# Patient Record
Sex: Male | Born: 1940 | Race: White | Hispanic: No | Smoking: Former smoker
Health system: Southern US, Community
[De-identification: ages and names within clinical notes are randomized; demographics above are authoritative.]

## PROBLEM LIST (undated history)

## (undated) DIAGNOSIS — E119 Type 2 diabetes mellitus without complications: Secondary | ICD-10-CM

## (undated) DIAGNOSIS — M1990 Unspecified osteoarthritis, unspecified site: Secondary | ICD-10-CM

## (undated) DIAGNOSIS — C3490 Malignant neoplasm of unspecified part of unspecified bronchus or lung: Secondary | ICD-10-CM

## (undated) DIAGNOSIS — J4489 Other specified chronic obstructive pulmonary disease: Secondary | ICD-10-CM

## (undated) DIAGNOSIS — E785 Hyperlipidemia, unspecified: Secondary | ICD-10-CM

## (undated) DIAGNOSIS — IMO0001 Reserved for inherently not codable concepts without codable children: Secondary | ICD-10-CM

## (undated) DIAGNOSIS — R0602 Shortness of breath: Secondary | ICD-10-CM

## (undated) DIAGNOSIS — J449 Chronic obstructive pulmonary disease, unspecified: Secondary | ICD-10-CM

## (undated) DIAGNOSIS — I480 Paroxysmal atrial fibrillation: Secondary | ICD-10-CM

## (undated) DIAGNOSIS — I10 Essential (primary) hypertension: Secondary | ICD-10-CM

## (undated) DIAGNOSIS — IMO0002 Reserved for concepts with insufficient information to code with codable children: Secondary | ICD-10-CM

## (undated) HISTORY — DX: Paroxysmal atrial fibrillation: I48.0

## (undated) HISTORY — PX: EYE SURGERY: SHX253

## (undated) HISTORY — DX: Reserved for concepts with insufficient information to code with codable children: IMO0002

## (undated) HISTORY — DX: Type 2 diabetes mellitus without complications: E11.9

## (undated) HISTORY — DX: Malignant neoplasm of unspecified part of unspecified bronchus or lung: C34.90

## (undated) HISTORY — DX: Chronic obstructive pulmonary disease, unspecified: J44.9

## (undated) HISTORY — DX: Reserved for inherently not codable concepts without codable children: IMO0001

## (undated) HISTORY — DX: Other specified chronic obstructive pulmonary disease: J44.89

## (undated) HISTORY — DX: Hyperlipidemia, unspecified: E78.5

---

## 1993-12-13 HISTORY — PX: CHOLECYSTECTOMY: SHX55

## 1993-12-13 HISTORY — PX: NEPHRECTOMY: SHX65

## 1998-12-19 ENCOUNTER — Ambulatory Visit (HOSPITAL_COMMUNITY): Admission: RE | Admit: 1998-12-19 | Discharge: 1998-12-19 | Payer: Self-pay | Admitting: Gastroenterology

## 2003-03-27 ENCOUNTER — Encounter: Admission: RE | Admit: 2003-03-27 | Discharge: 2003-03-27 | Payer: Self-pay | Admitting: Family Medicine

## 2003-03-27 ENCOUNTER — Encounter: Payer: Self-pay | Admitting: Family Medicine

## 2004-01-20 ENCOUNTER — Ambulatory Visit (HOSPITAL_COMMUNITY): Admission: RE | Admit: 2004-01-20 | Discharge: 2004-01-20 | Payer: Self-pay | Admitting: Gastroenterology

## 2004-01-20 ENCOUNTER — Encounter (INDEPENDENT_AMBULATORY_CARE_PROVIDER_SITE_OTHER): Payer: Self-pay | Admitting: *Deleted

## 2005-01-04 ENCOUNTER — Encounter: Admission: RE | Admit: 2005-01-04 | Discharge: 2005-04-04 | Payer: Self-pay | Admitting: Family Medicine

## 2007-07-10 ENCOUNTER — Encounter: Admission: RE | Admit: 2007-07-10 | Discharge: 2007-07-10 | Payer: Self-pay | Admitting: Family Medicine

## 2009-04-09 ENCOUNTER — Encounter: Admission: RE | Admit: 2009-04-09 | Discharge: 2009-04-09 | Payer: Self-pay | Admitting: Family Medicine

## 2010-07-08 ENCOUNTER — Encounter: Payer: Self-pay | Admitting: Internal Medicine

## 2010-07-08 ENCOUNTER — Encounter: Admission: RE | Admit: 2010-07-08 | Discharge: 2010-07-08 | Payer: Self-pay | Admitting: Family Medicine

## 2010-07-16 ENCOUNTER — Ambulatory Visit: Payer: Self-pay | Admitting: Internal Medicine

## 2010-07-16 DIAGNOSIS — J449 Chronic obstructive pulmonary disease, unspecified: Secondary | ICD-10-CM | POA: Insufficient documentation

## 2010-07-16 DIAGNOSIS — E119 Type 2 diabetes mellitus without complications: Secondary | ICD-10-CM

## 2010-07-16 DIAGNOSIS — E785 Hyperlipidemia, unspecified: Secondary | ICD-10-CM | POA: Insufficient documentation

## 2010-08-13 ENCOUNTER — Ambulatory Visit: Payer: Self-pay | Admitting: Internal Medicine

## 2010-11-12 ENCOUNTER — Ambulatory Visit: Payer: Self-pay | Admitting: Internal Medicine

## 2011-01-14 NOTE — Assessment & Plan Note (Signed)
Summary: Pulmonary/ f/u ov  pfts showing 23% reversal,  try off spiriva   Copy to:  Dr. Manus Gunning Primary Provider/Referring Provider:  Dr. Blair Heys  CC:  4 wk followup.  Pt states that he feels he is able to do more activity without getting out of breath.  No complaints today. Has only used ventolin x 2 sionce last seen.Marland Kitchen  History of Present Illness: 69 yowm quit smoking  2004 due to sob got some better after that but required spiriva and albuterol with gradually increase need for the latter over the years with only one block tol without stopping.  July 16, 2010  1st pulmonary office eval  after recent flare assoc with nasal congestion also  rx with prednisone started on dulera by primary care  and now back near nl.  minimal mucoid sputum in am, no noct exac but still using lots of ventolin and concerned he's using too much.  Continue Dulera but increase the strength to the 200 and use it 2 puffs first thing  in am and 2 puffs again in pm about 12 hours later  August 13, 2010 4 wk followup.  Pt states that he feels he is able to do more activity without getting out of breath.  No complaints today. Has only used ventolin x 2 sionce last seen.  no nocturnal symptoms. wants to try off spiriva.  sob in pm's when walks in heat, o/w ok. Pt denies any significant sore throat, dysphagia, itching, sneezing,  nasal congestion or excess secretions,  fever, chills, sweats, unintended wt loss, pleuritic or exertional cp, hempoptysis, change in activity tolerance  orthopnea pnd or leg swelling Pt also denies any obvious fluctuation in symptoms with weather or environmental change or other alleviating or aggravating factors.     Pt denies any increase in rescue therapy over baseline, denies waking up needing it or having early am exacerbations of coughing/wheezing/ or dyspnea   Problems Prior to Update: 1)  Hyperlipidemia  (ICD-272.4) 2)  Dm  (ICD-250.00) 3)  COPD  (ICD-496)  Medications Prior to  Update: 1)  Spiriva Handihaler 18 Mcg Caps (Tiotropium Bromide Monohydrate) .... Inhale Contents of 1 Capsule Daily 2)  Dulera 200-5 Mcg/act Aero (Mometasone Furo-Formoterol Fum) .... 2 Puffs First Thing  in Am and 2 Puffs Again in Pm About 12 Hours Later 3)  Metformin Hcl 500 Mg Tabs (Metformin Hcl) .Marland Kitchen.. 1 Four Times A Day 4)  Doxazosin Mesylate 2 Mg Tabs (Doxazosin Mesylate) .Marland Kitchen.. 1 Once Daily 5)  Simvastatin 40 Mg Tabs (Simvastatin) .Marland Kitchen.. 1 At Bedtime 6)  Ventolin Hfa 108 (90 Base) Mcg/act Aers (Albuterol Sulfate) .... 2 Puffs Every 4-6 Hours As Needed  Current Medications (verified): 1)  Spiriva Handihaler 18 Mcg Caps (Tiotropium Bromide Monohydrate) .... Inhale Contents of 1 Capsule Daily 2)  Dulera 200-5 Mcg/act Aero (Mometasone Furo-Formoterol Fum) .... 2 Puffs First Thing  in Am and 2 Puffs Again in Pm About 12 Hours Later 3)  Metformin Hcl 500 Mg Tabs (Metformin Hcl) .Marland Kitchen.. 1 Four Times A Day 4)  Doxazosin Mesylate 2 Mg Tabs (Doxazosin Mesylate) .Marland Kitchen.. 1 Once Daily 5)  Simvastatin 40 Mg Tabs (Simvastatin) .Marland Kitchen.. 1 At Bedtime 6)  Ventolin Hfa 108 (90 Base) Mcg/act Aers (Albuterol Sulfate) .... 2 Puffs Every 4-6 Hours As Needed  Allergies (verified): No Known Drug Allergies  Past History:  Past Medical History:  HYPERLIPIDEMIA (ICD-272.4) DM (ICD-250.00) COPD (ICD-496)    - HFA 25-50% p coaching July 16, 2010 >>  75% August 13, 2010     - PFT's August 13, 2010 FEV1  .93 (34%) ratio 37  with 23 % better p B2  DLC0  69%   Vital Signs:  Patient profile:   70 year old male Weight:      189 pounds O2 Sat:      96 % on Room air Temp:     97.4 degrees F oral Pulse rate:   81 / minute BP sitting:   116 / 78  (left arm)  Vitals Entered By: Vernie Murders (August 13, 2010 11:07 AM)  O2 Flow:  Room air  Physical Exam  Additional Exam:  amb pleasant elderly wm nad wt 189 July 16, 2010 > 189 August 13, 2010  HEENT mild turbinate edema.  Oropharynx no thrush or excess pnd  or cobblestoning.  No JVD or cervical adenopathy. Mild accessory muscle hypertrophy. Trachea midline, nl thryroid. Chest was hyperinflated by percussion with diminished breath sounds and moderate increased exp time without wheeze. Hoover sign positive at mid inspiration. Regular rate and rhythm without murmur gallop or rub or increase P2 or edema.  Abd: no hsm, nl excursion. Ext warm without cyanosis or clubbing.     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  This improved from  50-75%  I think of spiriva in this setting like purchasing high octane fuel for an older car with lots of miles on it. It may help the perfomance enough to warrant the purchase, but it won't change the longevity of the car or make it any easier parking it. It should improve peak performance if the patient is patient and lets the medicine work the way it's intended  - improving activity tolerance where limits on the mechanical ventilatory ceiling causing dynamic hyperinflation is the problem.   See instructions for specific recommendations   Other Orders: Est. Patient Level IV (65784)  Patient Instructions: 1)   Try off spiriva remember it's like buying high octane fuel 2)  stay on dulera 200 2 puffs first thing  in am and 2 puffs again in pm about 12 hours later 3)  Return to office in 3 months, sooner if needed  Prescriptions: VENTOLIN HFA 108 (90 BASE) MCG/ACT AERS (ALBUTEROL SULFATE) 2 puffs every 4-6 hours as needed  #1 x 1   Entered and Authorized by:   Nyoka Cowden MD   Signed by:   Nyoka Cowden MD on 08/13/2010   Method used:   Electronically to        [facility]* (retail)       [address] [place], [place]  [address]       Ph: [phone] or [phone]       Fax: [phone]   RxID:   [id number] DULERA 200-5 MCG/ACT AERO (MOMETASONE FURO-FORMOTEROL FUM) 2 puffs first thing  in am and 2 puffs again in pm about 12 hours later  #1 x 11   Entered and Authorized by:   Nyoka Cowden MD   Signed  by:   Nyoka Cowden MD on 08/13/2010   Method used:   Electronically to        [facility]* (retail)       [address] [place], [place]  [address]       Ph: [phone] or [phone]       Fax: [phone]   RxID:   [id number]

## 2011-01-14 NOTE — Assessment & Plan Note (Signed)
Summary: Pulmonary new pt eval/ HFA 50%   Visit Type:  Initial Consult Copy to:  Dr. Manus Gunning Primary Lorinda Copland/Referring Cote Mayabb:  Dr. Blair Heys  CC:  Dyspnea.  History of Present Illness: 52 yowm quit smoking  2004 due to sob got some better after that but required spiriva and albuterol with gradually increase need for the latter over the years with only one block tol without stopping.  July 16, 2010  1st pulmonary office eval  after recent flare assoc with nasal congestion also  rx with prednisone started on dulera by primary care  and now back near nl.  minimal mucoid sputum in am, no noct exac but still using lots of ventolin and concerned he's using too much.  Pt denies any significant sore throat, dysphagia, itching, sneezing,  ongoing nasal congestion or excess secretions,  fever, chills, sweats, unintended wt loss, pleuritic or exertional cp, hempoptysis, change in activity tolerance  orthopnea pnd or leg swelling.  Pt also denies any obvious fluctuation in symptoms with weather or environmental change or other alleviating or aggravating factors.       Current Medications (verified): 1)  Spiriva Handihaler 18 Mcg Caps (Tiotropium Bromide Monohydrate) .... Inhale Contents of 1 Capsule Daily 2)  Ventolin Hfa 108 (90 Base) Mcg/act Aers (Albuterol Sulfate) .... 2 Puffs Every 4-6 Hours As Needed 3)  Dulera 100-5 Mcg/act Aero (Mometasone Furo-Formoterol Fum) .... 2 Puffs Two Times A Day 4)  Metformin Hcl 500 Mg Tabs (Metformin Hcl) .Marland Kitchen.. 1 Four Times A Day 5)  Doxazosin Mesylate 2 Mg Tabs (Doxazosin Mesylate) .Marland Kitchen.. 1 Once Daily 6)  Simvastatin 40 Mg Tabs (Simvastatin) .Marland Kitchen.. 1 At Bedtime  Allergies (verified): No Known Drug Allergies  Past History:  Past Medical History:  HYPERLIPIDEMIA (ICD-272.4) DM (ICD-250.00) COPD (ICD-496)    - HFA 25-50% p coaching July 16, 2010     - PFT's rec July 16, 2010   Family History: Negative for respiratory diseases or atopy   Social  History: Married Children Retired Former smoker.  Quit in 2004.  Smoked approx 40 yrs up to 1-1/2 ppd. No ETOH  Review of Systems       The patient complains of shortness of breath with activity and nasal congestion/difficulty breathing through nose.  The patient denies shortness of breath at rest, productive cough, non-productive cough, coughing up blood, chest pain, irregular heartbeats, acid heartburn, indigestion, loss of appetite, weight change, abdominal pain, difficulty swallowing, sore throat, tooth/dental problems, headaches, sneezing, itching, ear ache, anxiety, depression, hand/feet swelling, joint stiffness or pain, rash, change in color of mucus, and fever.    Vital Signs:  Patient profile:   70 year old male Height:      68 inches Weight:      189 pounds BMI:     28.84 O2 Sat:      97 % on Room air Temp:     97.3 degrees F oral Pulse rate:   97 / minute BP sitting:   118 / 80  (left arm)  Vitals Entered By: Vernie Murders (July 16, 2010 11:30 AM)  O2 Flow:  Room air  Physical Exam  Additional Exam:  amb pleasant elderly wm nad wt 189 July 16, 2010  HEENT mild turbinate edema.  Oropharynx no thrush or excess pnd or cobblestoning.  No JVD or cervical adenopathy. Mild accessory muscle hypertrophy. Trachea midline, nl thryroid. Chest was hyperinflated by percussion with diminished breath sounds and moderate increased exp time without  wheeze. Hoover sign positive at mid inspiration. Regular rate and rhythm without murmur gallop or rub or increase P2 or edema.  Abd: no hsm, nl excursion. Ext warm without cyanosis or clubbing.     CXR  Procedure date:  07/08/2010  Findings:      copd/emphsysema, no acute infiltrates  Impression & Recommendations:  Problem # 1:  COPD (ICD-496) Mild to mod clincally with sign reversible component suggested by dependence on ventolin and recent steroid responsive flare    DDX of  difficult airways managment all start with A with  one B and one C:  Adherence, Ace Inhibitors, Acid Reflux, Active Sinus Disease, Alpha 1 Antitripsin deficiency, Anxiety masquerading as Airways dz,  ABPA,  allergy(esp in young), Aspiration (esp in elderly), Adverse effects of DPI,  Active smokers, plus one B  = Beta blocker use and occlult CHF  Adherence is probably the greatest concern here and starts with understanding/ ability to use hfa optimally I spent extra time with the patient today explaining optimal mdi  technique.  This improved from  25-50%.  He feels dulera is already helping so try max rx x 4 weeks then return for PFT's and may even consider stopping spriva, depending on his ability to use hfa and his response to dulera 200  See instructions for specific recommendations   Medications Added to Medication List This Visit: 1)  Spiriva Handihaler 18 Mcg Caps (Tiotropium bromide monohydrate) .... Inhale contents of 1 capsule daily 2)  Dulera 100-5 Mcg/act Aero (Mometasone furo-formoterol fum) .... 2 puffs two times a day 3)  Dulera 200-5 Mcg/act Aero (Mometasone furo-formoterol fum) .... 2 puffs first thing  in am and 2 puffs again in pm about 12 hours later 4)  Metformin Hcl 500 Mg Tabs (Metformin hcl) .Marland Kitchen.. 1 four times a day 5)  Doxazosin Mesylate 2 Mg Tabs (Doxazosin mesylate) .Marland Kitchen.. 1 once daily 6)  Simvastatin 40 Mg Tabs (Simvastatin) .Marland Kitchen.. 1 at bedtime 7)  Ventolin Hfa 108 (90 Base) Mcg/act Aers (Albuterol sulfate) .... 2 puffs every 4-6 hours as needed  Other Orders: New Patient Level V ([address])  Patient Instructions: 1)  Continue Dulera but increase the strength to the 200 and use it 2 puffs first thing  in am and 2 puffs again in pm about 12 hours later  2)  Please schedule a follow-up appointment in 4 weeks, sooner if needed with PFT's on return

## 2011-01-14 NOTE — Assessment & Plan Note (Signed)
Summary: Pulmonary/  ext summary f/u ov with hfa 90%   Copy to:  Dr. Manus Gunning Primary Velna Hedgecock/Referring Orla Jolliff:  Dr. Blair Heys  CC:  Dyspnea- better.  History of Present Illness: 49 yowm quit smoking  2004 due to sob got some better after that but required spiriva and albuterol with gradually increase need for the latter over the years with only one block tol without stopping.  July 16, 2010  1st pulmonary office eval  after recent flare assoc with nasal congestion also  rx with prednisone started on dulera by primary care  and now back near nl.  minimal mucoid sputum in am, no noct exac but still using lots of ventolin and concerned he's using too much.  PFT's with 23% better p B2 rec  Continue Dulera but increase the strength to the 200 and use it 2 puffs first thing  in am and 2 puffs again in pm about 12 hours later  August 13, 2010 4 wk followup.  Pt states that he feels he is able to do more activity without getting out of breath.  No complaints today. Has only used ventolin x 2 sionce last seen.  no nocturnal symptoms. wants to try off spiriva.  sob in pm's when walks in heat, o/w ok. rec  Try off spiriva remember it's like buying high octane fuel stay on dulera 200 2 puffs first thing  in am and 2 puffs again in pm about 12 hours later Found dulera too expensive so d/c'd around Oct 1, continued spiriva and no change in doe but uses ventolin again where didn't need it on dulera.  November 12, 2010 ov breathing ok but back using ventolin daily, esp in pm, p stopped dulera samples due to price of rx.  Pt denies any significant sore throat, dysphagia, itching, sneezing,  nasal congestion or excess secretions,  fever, chills, sweats, unintended wt loss, pleuritic or exertional cp, hempoptysis, variability  in activity tolerance  orthopnea pnd or leg swelling. Pt also denies any obvious fluctuation in symptoms with weather or environmental change or other alleviating or aggravating  factors.    Pt denies   denies waking up needing it or having early am exacerbations of coughing/wheezing/ or dyspnea       Current Medications (verified): 1)  Spiriva Handihaler 18 Mcg Caps (Tiotropium Bromide Monohydrate) .... Inhale Contents of 1 Capsule Daily 2)  Metformin Hcl 500 Mg Tabs (Metformin Hcl) .Marland Kitchen.. 1 Four Times A Day 3)  Doxazosin Mesylate 2 Mg Tabs (Doxazosin Mesylate) .Marland Kitchen.. 1 Once Daily 4)  Simvastatin 40 Mg Tabs (Simvastatin) .Marland Kitchen.. 1 At Bedtime 5)  Ventolin Hfa 108 (90 Base) Mcg/act Aers (Albuterol Sulfate) .... 2 Puffs Every 4-6 Hours As Needed  Allergies (verified): No Known Drug Allergies  Past History:  Past Medical History:  HYPERLIPIDEMIA (ICD-272.4) DM (ICD-250.00) COPD (ICD-496)    - HFA 25-50%  July 16, 2010> 75% August 13, 2010 > 90% November 12, 2010     - PFT's August 13, 2010 FEV1  .93 (34%) ratio 37  with 23 % better p B2  DLC0  69%   Vital Signs:  Patient profile:   70 year old male Weight:      196 pounds O2 Sat:      99 % on Room air Temp:     97.6 degrees F oral Pulse rate:   71 / minute BP sitting:   158 / 96  (left arm)  Vitals Entered By: Vernie Murders (November 12, 2010 9:30 AM)  O2 Flow:  Room air  Physical Exam  Additional Exam:  amb pleasant elderly wm nad pseudowheeze resolves with purse lip maneuver  wt 189 July 16, 2010 > 189 August 13, 2010 > 196 November 12, 2010  HEENT mild turbinate edema.  Oropharynx no thrush or excess pnd or cobblestoning.  No JVD or cervical adenopathy. Mild accessory muscle hypertrophy. Trachea midline, nl thryroid. Chest was hyperinflated by percussion with diminished breath sounds and moderate increased exp time without wheeze. Hoover sign positive at mid inspiration. Regular rate and rhythm without murmur gallop or rub or increase P2 or edema.  Abd: no hsm, nl excursion. Ext warm without cyanosis or clubbing.     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   I spent extra time with the patient today explaining optimal mdi  technique.  This improved from  75-90%   I think of spiriva in this setting like purchasing high octane fuel for an older car with lots of miles on it. It may help the perfomance enough to warrant the purchase, but it won't change the longevity of the car or make it any easier parking it. It should improve peak performance if the patient is patient and lets the medicine work the way it's intended  - improving activity tolerance where limits on the mechanical ventilatory ceiling causing dynamic hyperinflation is the problem.   See instructions for specific recommendations   Medications Added to Medication List This Visit: 1)  Symbicort 160-4.5 Mcg/act Aero (Budesonide-formoterol fumarate) .... 2 puffs first thing  in am and 2 puffs again in pm about 12 hours later  Other Orders: Est. Patient Level IV ([address])  Patient Instructions: 1)  start symbicort 160 2 puffs first thing  in am and 2 puffs again in pm about 12 hours later you should notice better better breathing and less need for ventolin 2)  Work on inhaler technique:  relax and blow all the way out then take a nice smooth deep breath back in, triggering the inhaler at same time you start breathing in  3)  Return to office in 3 months, sooner if needed  Prescriptions: SYMBICORT 160-4.5 MCG/ACT  AERO (BUDESONIDE-FORMOTEROL FUMARATE) 2 puffs first thing  in am and 2 puffs again in pm about 12 hours later  #1 x 11   Entered and Authorized by:   Nyoka Cowden MD   Signed by:   Nyoka Cowden MD on 11/12/2010   Method used:   Print then Give to  Patient   RxID:   [id number]    Immunization History:  Influenza Immunization History:    Influenza:  historical (08/13/2010)

## 2011-01-14 NOTE — Miscellaneous (Signed)
Summary: Orders Update pft charges  Clinical Lists Changes  Orders: Added new Service order of Carbon Monoxide diffusing w/capacity (94720) - Signed Added new Service order of Lung Volumes (94240) - Signed Added new Service order of Spirometry (Pre & Post) (94060) - Signed 

## 2011-01-28 ENCOUNTER — Ambulatory Visit (INDEPENDENT_AMBULATORY_CARE_PROVIDER_SITE_OTHER): Payer: MEDICARE | Admitting: Internal Medicine

## 2011-01-28 ENCOUNTER — Encounter: Payer: Self-pay | Admitting: Internal Medicine

## 2011-01-28 DIAGNOSIS — J449 Chronic obstructive pulmonary disease, unspecified: Secondary | ICD-10-CM

## 2011-02-03 NOTE — Assessment & Plan Note (Signed)
Summary: Pulmonary/ final summary f/u ov with hfa 100% > f/u prn   Visit Type:  Follow-up Copy to:  Dr. Manus Gunning Primary Provider/Referring Provider:  Dr. Blair Heys  CC:  Pt states breathing is the same.Brent Wade  History of Present Illness: 70 yowm quit smoking  2004 due to sob got some better after that but required spiriva and albuterol with gradually increase need for the latter over the years with only one block tol without stopping.  July 16, 2010  1st pulmonary office eval  after recent flare assoc with nasal congestion also  rx with prednisone started on dulera by primary care  and now back near nl.  minimal mucoid sputum in am, no noct exac but still using lots of ventolin and concerned he's using too much.  PFT's with 23% better p B2 rec  Continue Dulera but increase the strength to the 200 and use it 2 puffs first thing  in am and 2 puffs again in pm about 12 hours later  August 13, 2010 4 wk followup.  Pt states that he feels he is able to do more activity without getting out of breath.  No complaints today. Has only used ventolin x 2 sionce last seen.  no nocturnal symptoms. wants to try off spiriva.  sob in pm's when walks in heat, o/w ok. rec  Try off spiriva remember it's like buying high octane fuel stay on dulera 200 2 puffs first thing  in am and 2 puffs again in pm about 12 hours later Found dulera too expensive so d/c'd around Oct 1, continued spiriva and no change in doe but uses ventolin again where didn't need it on dulera.  November 12, 2010 ov breathing ok but back using ventolin daily, esp in pm, p stopped dulera samples due to price of rx.  start symbicort 160 2 puffs first thing  in am and 2 puffs again in pm about 12 hours later you should notice better better breathing and less need for ventolin  January 28, 2011 ov overall better sob to point where can walk up to a mile if he takes his time but trouble with gardening (tilling) and rarely using any saba.  sleeping ok with no noct or early am exac.  Pt denies any significant sore throat, dysphagia, itching, sneezing,  nasal congestion or excess secretions,  fever, chills, sweats, unintended wt loss, pleuritic or exertional cp, hempoptysis, change in activity tolerance  orthopnea pnd or leg swelling      Preventive Screening-Counseling & Management  Alcohol-Tobacco     Alcohol drinks/day: 0     Smoking Status: quit     Packs/Day: 2.0     Year Started: 1959     Year Quit: 2005  Current Medications (verified): 1)  Spiriva Handihaler 18 Mcg Caps (Tiotropium Bromide Monohydrate) .... Inhale Contents of 1 Capsule Daily 2)  Metformin Hcl 500 Mg Tabs (Metformin Hcl) .Brent Wade.. 1 Four Times A Day 3)  Doxazosin Mesylate 2 Mg Tabs (Doxazosin Mesylate) .Brent Wade.. 1 Once Daily 4)  Simvastatin 40 Mg Tabs (Simvastatin) .Brent Wade.. 1 At Bedtime 5)  Symbicort 160-4.5 Mcg/act  Aero (Budesonide-Formoterol Fumarate) .... 2 Puffs First Thing  in Am and 2 Puffs Again in Pm About 12 Hours Later 6)  Ventolin Hfa 108 (90 Base) Mcg/act Aers (Albuterol Sulfate) .... 2 Puffs Every 4-6 Hours As Needed  Allergies (verified): No Known Drug Allergies  Past History:  Past Medical History:  HYPERLIPIDEMIA (ICD-272.4) DM (ICD-250.00) COPD (ICD-496)    -  HFA 25-50%  July 16, 2010>   100% January 28, 2011     - PFT's August 13, 2010 FEV1  .93 (34%) ratio 37  with 23 % better p B2  DLC0  69%   Social History: Smoking Status:  quit Packs/Day:  2.0 Alcohol drinks/day:  0  Vital Signs:  Patient profile:   70 year old male Height:      68 inches Weight:      19.2 pounds BMI:     2.93 O2 Sat:      96 % on Room air Temp:     97.7 degrees F oral Pulse rate:   73 / minute BP sitting:   138 / 88  (left arm) Cuff size:   regular  Vitals Entered By: Zackery Barefoot CMA (January 28, 2011 10:46 AM)  O2 Flow:  Room air CC: Pt states breathing is the same. Comments Medications reviewed with patient Verified contact number and  pharmacy with patient Zackery Barefoot [facility]  January 28, 2011 10:46 AM    Physical Exam  Additional Exam:  amb pleasant elderly wm nad   wt 189 July 16, 2010 >  196 November 12, 2010 > 192 January 28, 2011  HEENT mild turbinate edema.  Oropharynx edentuous with dentures in place but  no thrush or excess pnd or cobblestoning.  No JVD or cervical adenopathy. Mild accessory muscle hypertrophy. Trachea midline, nl thryroid. Chest was hyperinflated by percussion with diminished breath sounds and moderate increased exp time without wheeze. Hoover sign positive at mid inspiration. Regular rate and rhythm without murmur gallop or rub or increase P2 or edema.  Abd: no hsm, nl excursion. Ext warm without cyanosis or clubbing.     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   I had an extended discussion with the patient today lasting 15 to 20 minutes of a 25 minute visit on the following issues:   I spent extra time with the patient today explaining optimal mdi  technique.  This improved from  90-100%   Ok to use ventolin as needed before planned strenous activity and try to "be all he can be" in term of activities desired, with formal pulmonary rehab an option in the future  Refills per Dr Randel Books office or f/u here yearly at this point.   Other Orders: Est. Patient Level IV (04540)  Patient Instructions: 1)  If there is a task that you know will make you short of breath like Tilling go ahead and use your ventolin 2 puffs before the activity  2)  If loosing ground with you exercise with your breathing or not happy with breathing medications return to this clinic

## 2011-04-30 NOTE — Op Note (Signed)
NAME:  Brent Wade, Brent Wade                        ACCOUNT NO.:  [id number]   MEDICAL RECORD NO.:  [id number]                   PATIENT TYPE:  AMB   LOCATION:  ENDO                                 FACILITY:  [facility]   PHYSICIAN:  Petra Kuba, M.D.                 DATE OF BIRTH:  January 05, 1941   DATE OF PROCEDURE:  01/20/2004  DATE OF DISCHARGE:                                 OPERATIVE REPORT   PROCEDURE:  Colonoscopy with hot biopsy.   INDICATION:  Patient with history of colon polyps due for re-screening.  Consent was signed after risks, benefits, methods, and options were  thoroughly discussed in the office on multiple occasions.   MEDICATIONS USED:  Demerol 60, Versed 6.   PROCEDURE:  Rectal inspection is pertinent for external hemorrhoids, small.  Digital exam was negative.  The video-colonoscope was inserted and easily  advanced around the colon to the cecum.  He did not require any abdominal  pressure or position changes.  The cecum was identified by the appendiceal  orifice and the ileocecal valve.  No obvious abnormalities were seen on  insertion of the scope.  It was slowly withdrawn.  The prep was adequate.  There was some liquid stool that required washing and suctioning.  On  withdrawal from the colon no abnormalities were seen until we withdrew back  to the mid sigmoid where a small polyp was seen and was hot biopsied x1.  The scope was further withdrawn and no addition findings were seen as we  withdrew back to the rectum.  Anorectal pull through on retroflexion  confirmed some small hemorrhoids.  The scope was reinserted a short ways up  the left side of the colon.  The air was suctioned.  The scope was removed.   The patient tolerated the procedure well.  There was no obvious immediate  complication.   ENDOSCOPIC DIAGNOSIS:  1. Internal and external hemorrhoids.  2. Small mid sigmoid polyp, hot biopsied.  3. Otherwise within normal limits to the cecum.   PLAN:  1.  Yearly rectals and guaiac per Dr. Manus Gunning.  2. Await pathology.  Probable recheck colon screening in five years and have     him seen back sooner p.r.n.                                               Petra Kuba, M.D.    MEM/MEDQ  D:  01/20/2004  T:  01/20/2004  Job:  [id number]   cc:   Bryan Lemma. Manus Gunning, M.D.  [address] [place]  [place] [address]  Fax: [phone]

## 2011-06-23 ENCOUNTER — Telehealth: Payer: Self-pay | Admitting: *Deleted

## 2011-06-23 NOTE — Telephone Encounter (Signed)
Ok x one only but needs ov before runs out with all meds/ nebs/inhalers in hand to regroup because this is too frequent refill on what should be a rescue inhaler

## 2011-06-23 NOTE — Telephone Encounter (Signed)
Dr. Sherene Sires, we received refill request from [facility] for proair 2 puffs every 4-6 hours as needed.  Rx was last sent on 01/11/11 # 1 x 5.  Pharmacy states pt has already pick up all of these fills -- are you ok with sending in rx?

## 2011-06-24 MED ORDER — ALBUTEROL SULFATE HFA 108 (90 BASE) MCG/ACT IN AERS: 2.0000 | INHALATION_SPRAY | Freq: Four times a day (QID) | RESPIRATORY_TRACT | Status: DC | PRN

## 2011-06-24 NOTE — Telephone Encounter (Signed)
Spoke with pt and sched him for appt with MW for 7.25.12 at 10 am. I advised he needs to bring all meds in hnad including all inhalers he uses. Pt verbalized understanding and rx for proair sent to pharm ok x 1 only.

## 2011-07-06 ENCOUNTER — Encounter: Payer: Self-pay | Admitting: Internal Medicine

## 2011-07-06 ENCOUNTER — Ambulatory Visit (INDEPENDENT_AMBULATORY_CARE_PROVIDER_SITE_OTHER): Payer: Medicare Other | Admitting: Internal Medicine

## 2011-07-06 ENCOUNTER — Ambulatory Visit (INDEPENDENT_AMBULATORY_CARE_PROVIDER_SITE_OTHER)
Admission: RE | Admit: 2011-07-06 | Discharge: 2011-07-06 | Disposition: A | Discharge status: Home / Self Care | Payer: Medicare Other | Source: Ambulatory Visit | Attending: Internal Medicine | Admitting: Internal Medicine

## 2011-07-06 VITALS — BP 116/80 | HR 86 | Temp 97.6°F | Ht 68.0 in | Wt 193.6 lb

## 2011-07-06 DIAGNOSIS — J4489 Other specified chronic obstructive pulmonary disease: Secondary | ICD-10-CM

## 2011-07-06 DIAGNOSIS — J449 Chronic obstructive pulmonary disease, unspecified: Secondary | ICD-10-CM

## 2011-07-06 DIAGNOSIS — J984 Other disorders of lung: Secondary | ICD-10-CM

## 2011-07-06 DIAGNOSIS — C3490 Malignant neoplasm of unspecified part of unspecified bronchus or lung: Secondary | ICD-10-CM | POA: Insufficient documentation

## 2011-07-06 DIAGNOSIS — R911 Solitary pulmonary nodule: Secondary | ICD-10-CM

## 2011-07-06 HISTORY — DX: Malignant neoplasm of unspecified part of unspecified bronchus or lung: C34.90

## 2011-07-06 NOTE — Progress Notes (Signed)
Subjective:     Patient ID: Brent Wade, male   DOB: June 12, 1941, 70 y.o.   MRN: [id number]  HPI  37 yowm quit smoking 2004 due to sob got some better after that but required spiriva and albuterol with gradually increase need for the latter over the years with only one block tol without stopping.   July 16, 2010 1st pulmonary office eval after recent flare assoc with nasal congestion also rx with prednisone started on dulera by primary care and now back near nl. minimal mucoid sputum in am, no noct exac but still using lots of ventolin and concerned he's using too much.  PFT's with 23% better p B2  rec Continue Dulera but increase the strength to the 200 and use it 2 puffs first thing in am and 2 puffs again in pm about 12 hours later   August 13, 2010 4 wk followup. Pt states that he feels he is able to do more activity without getting out of breath. No complaints today. Has only used ventolin x 2 sionce last seen. no nocturnal symptoms. wants to try off spiriva. sob in pm's when walks in heat, o/w ok. rec Try off spiriva remember it's like buying high octane fuel  stay on dulera 200 2 puffs first thing in am and 2 puffs again in pm about 12 hours later  Found dulera too expensive so d/c'd around Oct 1, continued spiriva and no change in doe but uses ventolin again where didn't need it on dulera.   November 12, 2010 ov breathing ok but back using ventolin daily, esp in pm, p stopped dulera samples due to price of rx. rec  start symbicort 160 2 puffs first thing in am and 2 puffs again in pm about 12 hours later you should notice better better breathing and less need for ventolin   January 28, 2011 ov overall better sob to point where can walk up to a mile if he takes his time but trouble with gardening (tilling) and rarely using any saba. sleeping ok with no noct or early am exac. rec no change in rx.  07/06/2011 ov/ Brent Wade cc worse breathing when hot, otherwise doing fine with no cough -  using rescue up to 3 x daily but Sleeping ok without nocturnal  or early am exac of resp c/o's or need for noct saba.   Pt denies any significant sore throat, dysphagia, itching, sneezing,  nasal congestion or excess/ purulent secretions,  fever, chills, sweats, unintended wt loss, pleuritic or exertional cp, hempoptysis, orthopnea pnd or leg swelling.    Also denies any obvious fluctuation of symptoms with weather or environmental changes or other aggravating or alleviating factors.     Past Medical History:  HYPERLIPIDEMIA (ICD-272.4)  DM (ICD-250.00)  COPD (ICD-496)  - HFA 90% 07/06/2011  - PFT's August 13, 2010 FEV1 .93 (34%) ratio 37 with 23 % better p B2 DLC0 69%  ? RUL Nodule 07/06/2011 > placed in tickle file for recall @ 3 months  Social History:  Smoking Status: quit  Packs/Day: 2.0  Alcohol drinks/day: 0  :    Review of Systems     Objective:   Physical Exam    amb pleasant elderly wm nad  wt 189 July 16, 2010 > 196 November 12, 2010 > 192 January 28, 2011 > 193 07/06/2011  HEENT mild turbinate edema. Oropharynx edentuous with dentures in place but no thrush or excess pnd or cobblestoning. No JVD or cervical adenopathy. Mild  accessory muscle hypertrophy. Trachea midline, nl thryroid. Chest was hyperinflated by percussion with diminished breath sounds and moderate increased exp time without wheeze. Hoover sign positive at mid inspiration. Regular rate and rhythm without murmur gallop or rub or increase P2 or edema. Abd: no hsm, nl excursion. Ext warm without cyanosis or clubbing  cxr 07/06/2011 Question right upper lobe nodule. Given the history of smoking, CT scan of the chest is recommended for better characterization.   Assessment:      Plan:

## 2011-07-06 NOTE — Patient Instructions (Signed)
We will call you with your cxr report  Work on perfecting inhaler technique:  relax and gently blow all the way out then take a nice smooth deep breath back in, triggering the inhaler at same time you start breathing in.  Hold for up to 5 seconds if you can.  Rinse and gargle with water when done   blow the medication out through your nose   If you are satisfied with your treatment plan let your doctor know and he/she can either refill your medications or you can return here when your prescription runs out.     If in any way you are not 100% satisfied,  please tell us.  If 100% better, tell your friends!

## 2011-07-06 NOTE — Assessment & Plan Note (Addendum)
GOLD III with sign reversibility worse only in extreme conditions which he has learned to manage well.  I had an extended discussion with the patient today lasting 15 to 20 minutes of a 25 minute visit on the following issues:   The proper method of use, as well as anticipated side effects, of this metered-dose inhaler are discussed and demonstrated to the patient. Improved to 90% with coaching    Each maintenance medication was reviewed in detail including most importantly the difference between maintenance and as needed and under what circumstances the prns are to be used.  Please see instructions for details which were reviewed in writing and the patient given a copy.

## 2011-07-07 NOTE — Progress Notes (Signed)
Quick Note:  Spoke with pt and notified of results per Dr. Wert. Pt verbalized understanding and denied any questions.  ______ 

## 2011-08-03 ENCOUNTER — Other Ambulatory Visit: Payer: Self-pay | Admitting: *Deleted

## 2011-08-03 MED ORDER — ALBUTEROL SULFATE HFA 108 (90 BASE) MCG/ACT IN AERS: 2.0000 | INHALATION_SPRAY | Freq: Four times a day (QID) | RESPIRATORY_TRACT | Status: DC | PRN

## 2011-09-08 ENCOUNTER — Telehealth: Payer: Self-pay | Admitting: *Deleted

## 2011-09-08 ENCOUNTER — Encounter: Payer: Self-pay | Admitting: *Deleted

## 2011-09-08 NOTE — Telephone Encounter (Signed)
Message copied by Christen Butter on Wed Sep 08, 2011 11:40 AM ------      Message from: Christen Butter      Created: Wed Jul 07, 2011 12:19 PM       Pt needs cxr due on 10/06/11

## 2011-09-08 NOTE — Telephone Encounter (Signed)
ATC pt and inform needs to sched appt with MW for f/u with cxr. Line was d/c'ed so will send letter.

## 2011-09-23 NOTE — Progress Notes (Signed)
Subjective:     Patient ID: Brent Wade, male   DOB: [birth year], 70 y.o.   MRN: [id number]  HPI  61 yowm quit smoking 2004 due to sob got some better after that but required spiriva and albuterol with gradually increase need for the latter over the years with only one block tol without stopping.   July 16, 2010 1st pulmonary office eval after recent flare assoc with nasal congestion also rx with prednisone started on dulera by primary care and now back near nl. minimal mucoid sputum in am, no noct exac but still using lots of ventolin and concerned he's using too much.  PFT's with 23% better p B2  rec Continue Dulera but increase the strength to the 200 and use it 2 puffs first thing in am and 2 puffs again in pm about 12 hours later   August 13, 2010 4 wk followup. Pt states that he feels he is able to do more activity without getting out of breath. No complaints today. Has only used ventolin x 2 sionce last seen. no nocturnal symptoms. wants to try off spiriva. sob in pm's when walks in heat, o/w ok. rec Try off spiriva remember it's like buying high octane fuel  stay on dulera 200 2 puffs first thing in am and 2 puffs again in pm about 12 hours later  Found dulera too expensive so d/c'd around Oct 1, continued spiriva and no change in doe but uses ventolin again where didn't need it on dulera.   November 12, 2010 ov breathing ok but back using ventolin daily, esp in pm, p stopped dulera samples due to price of rx. rec  start symbicort 160 2 puffs first thing in am and 2 puffs again in pm about 12 hours later you should notice better better breathing and less need for ventolin   January 28, 2011 ov overall better sob to point where can walk up to a mile if he takes his time but trouble with gardening (tilling) and rarely using any saba. sleeping ok with no noct or early am exac. rec no change in rx.  07/06/2011 ov/ Brent Wade cc worse breathing when hot, otherwise doing fine with no cough -  using rescue up to 3 x daily but Sleeping ok without nocturnal  or early am exac of resp c/o's or need for noct saba. rec Work on Chemical engineer technique     09/24/11 ov/ Brent Wade f/u ? SPN cc doe using proaire  avg once day when stir around p lunch.   Pt denies any significant sore throat, dysphagia, itching, sneezing,  nasal congestion or excess/ purulent secretions,  fever, chills, sweats, unintended wt loss, pleuritic or exertional cp, hempoptysis, orthopnea pnd or leg swelling.    Also denies any obvious fluctuation of symptoms with weather or environmental changes or other aggravating or alleviating factors.     Past Medical History:  HYPERLIPIDEMIA (ICD-272.4)  DM (ICD-250.00)  COPD (ICD-496)  - HFA 90% 07/06/2011  - PFT's August 13, 2010 FEV1 .93 (34%) ratio 37 with 23 % better p B2 DLC0 69%  ? RUL Nodule 07/06/2011 > placed in tickle file for recall @ 03/2012   Social History:  Smoking Status: quit  Packs/Day: 2.0  Alcohol drinks/day: 0    Family History ? Lung Ca Brother  ? Brain Ca Sister        Objective:   Physical Exam    amb pleasant elderly wm nad  wt 189 July 16, 2010 >  196 November 12, 2010 > 192 January 28, 2011 > 193 07/06/2011>  194 09/24/2011  HEENT mild turbinate edema. Oropharynx edentuous with dentures in place but no thrush or excess pnd or cobblestoning. No JVD or cervical adenopathy. Mild accessory muscle hypertrophy. Trachea midline, nl thryroid. Chest was hyperinflated by percussion with diminished breath sounds and moderate increased exp time without wheeze. Hoover sign positive at mid inspiration. Regular rate and rhythm without murmur gallop or rub or increase P2 or edema. Abd: no hsm, nl excursion. Ext warm without cyanosis or clubbing  cxr 07/06/2011 Question right upper lobe nodule. Given the history of smoking, CT scan of the chest is recommended for better characterization.   Assessment:      Plan:

## 2011-09-24 ENCOUNTER — Ambulatory Visit (INDEPENDENT_AMBULATORY_CARE_PROVIDER_SITE_OTHER)
Admission: RE | Admit: 2011-09-24 | Discharge: 2011-09-24 | Disposition: A | Discharge status: Home / Self Care | Payer: Medicare Other | Source: Ambulatory Visit | Attending: Internal Medicine | Admitting: Internal Medicine

## 2011-09-24 ENCOUNTER — Ambulatory Visit (INDEPENDENT_AMBULATORY_CARE_PROVIDER_SITE_OTHER): Payer: Medicare Other | Admitting: Internal Medicine

## 2011-09-24 ENCOUNTER — Encounter: Payer: Self-pay | Admitting: Internal Medicine

## 2011-09-24 VITALS — BP 108/70 | HR 102 | Temp 97.7°F | Ht 68.0 in | Wt 194.0 lb

## 2011-09-24 DIAGNOSIS — R911 Solitary pulmonary nodule: Secondary | ICD-10-CM

## 2011-09-24 DIAGNOSIS — J984 Other disorders of lung: Secondary | ICD-10-CM

## 2011-09-24 DIAGNOSIS — J449 Chronic obstructive pulmonary disease, unspecified: Secondary | ICD-10-CM

## 2011-09-24 DIAGNOSIS — J4489 Other specified chronic obstructive pulmonary disease: Secondary | ICD-10-CM

## 2011-09-24 NOTE — Patient Instructions (Signed)
No change in your medications  We need to see you in 6 months with pft's and cxr

## 2011-09-26 ENCOUNTER — Encounter: Payer: Self-pay | Admitting: Internal Medicine

## 2011-09-26 NOTE — Assessment & Plan Note (Addendum)
I had an extended discussion with the patient today lasting 15 to 20 minutes of a 25 minute visit on the following issues:   Based on severity of copd is he is very high risk for even considering a bx, much less any form of potential curative therapy.   Discussed in detail all the  indications, usual  risks and alternatives  relative to the benefits with patient who agrees to proceed with conservative f/u

## 2011-09-26 NOTE — Assessment & Plan Note (Signed)
Adequate control on present rx, reviewed  

## 2012-01-05 ENCOUNTER — Other Ambulatory Visit: Payer: Self-pay | Admitting: *Deleted

## 2012-01-05 MED ORDER — ALBUTEROL SULFATE HFA 108 (90 BASE) MCG/ACT IN AERS: 2.0000 | INHALATION_SPRAY | Freq: Four times a day (QID) | RESPIRATORY_TRACT | Status: DC | PRN

## 2012-02-25 ENCOUNTER — Telehealth: Payer: Self-pay | Admitting: *Deleted

## 2012-02-25 NOTE — Telephone Encounter (Signed)
Spoke with pt and have scheduled pft and ov for 03/27/12. Advised pt to wear comfortable clothes and do not use inhalers that am. Pt verbalized understanding.

## 2012-02-25 NOTE — Telephone Encounter (Signed)
Message copied by Christen Butter on Fri Feb 25, 2012  3:19 PM ------      Message from: Christen Butter      Created: Fri Sep 24, 2011  3:27 PM       Needs PFT and CXR in early April 2013

## 2012-03-27 ENCOUNTER — Ambulatory Visit (INDEPENDENT_AMBULATORY_CARE_PROVIDER_SITE_OTHER): Payer: Medicare Other | Admitting: Internal Medicine

## 2012-03-27 ENCOUNTER — Encounter: Payer: Self-pay | Admitting: Internal Medicine

## 2012-03-27 ENCOUNTER — Ambulatory Visit (INDEPENDENT_AMBULATORY_CARE_PROVIDER_SITE_OTHER)
Admission: RE | Admit: 2012-03-27 | Discharge: 2012-03-27 | Disposition: A | Discharge status: Home / Self Care | Payer: Medicare Other | Source: Ambulatory Visit | Attending: Internal Medicine | Admitting: Internal Medicine

## 2012-03-27 VITALS — BP 128/78 | HR 95 | Temp 98.0°F | Ht 68.0 in | Wt 192.0 lb

## 2012-03-27 DIAGNOSIS — R911 Solitary pulmonary nodule: Secondary | ICD-10-CM

## 2012-03-27 DIAGNOSIS — J4489 Other specified chronic obstructive pulmonary disease: Secondary | ICD-10-CM

## 2012-03-27 DIAGNOSIS — J449 Chronic obstructive pulmonary disease, unspecified: Secondary | ICD-10-CM

## 2012-03-27 LAB — PULMONARY FUNCTION TEST

## 2012-03-27 MED ORDER — PREDNISONE (PAK) 10 MG PO TABS: ORAL_TABLET | ORAL | Status: AC

## 2012-03-27 MED ORDER — FAMOTIDINE 20 MG PO TABS: ORAL_TABLET | ORAL | Status: DC

## 2012-03-27 NOTE — Assessment & Plan Note (Signed)
-   See cxr 07/06/2011 >  No change 09/24/11 > no change 03/27/2012    Most likely benign but certainly poor  candidate for excisional bx based on severity of lung dz so best options is to follow q 6 monhts

## 2012-03-27 NOTE — Progress Notes (Signed)
Subjective:     Patient ID: Brent Wade, male   DOB: September 27, 1941   MRN: [id number]  HPI  26  yowm quit smoking 2004 due to sob got some better after that but required spiriva and albuterol with gradually increase need for the latter over the years with only one block tol without stopping.   July 16, 2010 1st pulmonary office eval after recent flare assoc with nasal congestion also rx with prednisone started on dulera by primary care and now back near nl. minimal mucoid sputum in am, no noct exac but still using lots of ventolin and concerned he's using too much.  PFT's with 23% better p B2  rec Continue Dulera but increase the strength to the 200 and use it 2 puffs first thing in am and 2 puffs again in pm about 12 hours later   August 13, 2010 4 wk followup. Pt states that he feels he is able to do more activity without getting out of breath. No complaints today. Has only used ventolin x 2 sionce last seen. no nocturnal symptoms. wants to try off spiriva. sob in pm's when walks in heat, o/w ok. rec Try off spiriva remember it's like buying high octane fuel  stay on dulera 200 2 puffs first thing in am and 2 puffs again in pm about 12 hours later  Found dulera too expensive so d/c'd around Oct 1, continued spiriva and no change in doe but uses ventolin again where didn't need it on dulera.   November 12, 2010 ov breathing ok but back using ventolin daily, esp in pm, p stopped dulera samples due to price of rx. rec  start symbicort 160 2 puffs first thing in am and 2 puffs again in pm about 12 hours later you should notice better better breathing and less need for ventolin   January 28, 2011 ov overall better sob to point where can walk up to a mile if he takes his time but trouble with gardening (tilling) and rarely using any saba. sleeping ok with no noct or early am exac. rec no change in rx.  07/06/2011 ov/ Perl Folmar cc worse breathing when hot, otherwise doing fine with no cough - using  rescue up to 3 x daily but Sleeping ok without nocturnal  or early am exac of resp c/o's or need for noct saba. rec Work on Chemical engineer technique     09/24/11 ov/ Carlisha Wisler f/u ? SPN cc doe using proaire  avg once day when stir around p lunch.  rec No change rx  03/27/2012 f/u ov/Asianna Brundage cc breathing worse gradually x monthsassoc with chest congestion esp in am p waking  at usual time with no noct disturbance> sev tbsp of white mucus over 30 min then some beter the rest of the day. Using saba sev times daily. No overt sinus or hb symtpoms  Sleeping ok without nocturnal  or early am exacerbation  of respiratory  c/o's or need for noct saba. Also denies any obvious fluctuation of symptoms with weather or environmental changes or other aggravating or alleviating factors except as outlined above   Pt denies any significant sore throat, dysphagia, itching, sneezing,  nasal congestion or excess/ purulent secretions,  fever, chills, sweats, unintended wt loss, pleuritic or exertional cp, hempoptysis, orthopnea pnd or leg swelling.    Also denies any obvious fluctuation of symptoms with weather or environmental changes or other aggravating or alleviating factors.     Past Medical History:  HYPERLIPIDEMIA (ICD-272.4)  DM (ICD-250.00)  COPD (ICD-496)  - HFA 90% 07/06/2011  - PFT's August 13, 2010 FEV1 .93 (34%) ratio 37 with 23 % better p B2 DLC0 69%  ? RUL Nodule 07/06/2011 > placed in tickle file for recall @ 03/2012   Social History:  Smoking Status: quit  Packs/Day: 2.0  Alcohol drinks/day: 0    Family History ? Lung Ca Brother  ? Brain Ca Sister        Objective:   Physical Exam    amb pleasant elderly wm nad  wt 189 July 16, 2010 > 196 November 12, 2010 > 192 January 28, 2011 > 193 07/06/2011>  194 09/24/2011 > 03/27/2012  192  HEENT mild turbinate edema. Oropharynx edentuous with dentures in place but no thrush or excess pnd or cobblestoning. No JVD or cervical adenopathy.  Mild accessory muscle hypertrophy. Trachea midline, nl thryroid. Chest was hyperinflated by percussion with diminished breath sounds and moderate increased exp time without wheeze. Hoover sign positive at mid inspiration. Regular rate and rhythm without murmur gallop or rub or increase P2 or edema. Abd: no hsm, nl excursion. Ext warm without cyanosis or clubbing  cxr 07/06/2011 Question right upper lobe nodule. Given the history of smoking, CT scan of the chest is recommended for better characterization.  CXR  03/27/2012 :  1. New opacity in the lingula suspicious for pneumonia.  2. COPD.  3. No change in probable benign nodule in the right upper lobe.  See above.  My review : minimal linear atx in lingula    Assessment:      Plan:

## 2012-03-27 NOTE — Patient Instructions (Addendum)
Prednisone 10 mg take  4 each am x 2 days,   2 each am x 2 days,  1 each am x2days and stop   Symbicort 160 Take 2 puffs first thing in am and then another 2 puffs about 12 hours later.   Follow symbicort in am immediately by spiriva   Pepcid 20 mg one at bedtime  GERD (REFLUX)  is an extremely common cause of respiratory symptoms, many times with no significant heartburn at all.    It can be treated with medication, but also with lifestyle changes including avoidance of late meals, excessive alcohol, smoking cessation, and avoid fatty foods, chocolate, peppermint, colas, red wine, and acidic juices such as orange juice.  NO MINT OR MENTHOL PRODUCTS SO NO COUGH DROPS  USE SUGARLESS CANDY INSTEAD (jolley ranchers or Stover's)  NO OIL BASED VITAMINS - use powdered substitutes.    Please schedule a follow up office visit in 6 weeks, call sooner if needed

## 2012-03-27 NOTE — Assessment & Plan Note (Addendum)
-   HFA 90% 03/27/2012  - PFT's August 13, 2010 FEV1 .93 (34%) ratio 37 with 23 % better p B2 DLC0 69%  - PFT's 03/27/2012  FEV1  0.84 (31%) ratio 36 and 28% better p B2,  DLC0 67%  GOLD II with sign reversible component we should be able to take advantage of to improve airways control. DDX of  difficult airways managment all start with A and  include Adherence, Ace Inhibitors, Acid Reflux, Active Sinus Disease, Alpha 1 Antitripsin deficiency, Anxiety masquerading as Airways dz,  ABPA,  allergy(esp in young), Aspiration (esp in elderly), Adverse effects of DPI,  Active smokers, plus two Bs  = Bronchiectasis and Beta blocker use..and one C= CHF   Adherence seems ok but needs to remember prev instructions re symbicort 160 Take 2 puffs first thing in am and then another 2 puffs about 12 hours later.     ? Acid reflux, esp at hs > try adding pepcid 20 mg at hs.    Each maintenance medication was reviewed in detail including most importantly the difference between maintenance and as needed and under what circumstances the prns are to be used.  Please see instructions for details which were reviewed in writing and the patient given a copy.

## 2012-03-27 NOTE — Progress Notes (Signed)
PFT done today. 

## 2012-03-31 ENCOUNTER — Encounter: Payer: Self-pay | Admitting: *Deleted

## 2012-03-31 NOTE — Progress Notes (Signed)
Quick Note:  Will send a letter ______

## 2012-04-07 ENCOUNTER — Encounter: Payer: Self-pay | Admitting: Internal Medicine

## 2012-05-10 ENCOUNTER — Ambulatory Visit (INDEPENDENT_AMBULATORY_CARE_PROVIDER_SITE_OTHER)
Admission: RE | Admit: 2012-05-10 | Discharge: 2012-05-10 | Disposition: A | Discharge status: Home / Self Care | Payer: Medicare Other | Source: Ambulatory Visit | Attending: Internal Medicine | Admitting: Internal Medicine

## 2012-05-10 ENCOUNTER — Ambulatory Visit (INDEPENDENT_AMBULATORY_CARE_PROVIDER_SITE_OTHER): Payer: Medicare Other | Admitting: Internal Medicine

## 2012-05-10 ENCOUNTER — Encounter: Payer: Self-pay | Admitting: Internal Medicine

## 2012-05-10 VITALS — BP 142/80 | HR 101 | Temp 97.8°F | Ht 68.0 in | Wt 191.6 lb

## 2012-05-10 DIAGNOSIS — J449 Chronic obstructive pulmonary disease, unspecified: Secondary | ICD-10-CM

## 2012-05-10 DIAGNOSIS — J4489 Other specified chronic obstructive pulmonary disease: Secondary | ICD-10-CM

## 2012-05-10 DIAGNOSIS — R911 Solitary pulmonary nodule: Secondary | ICD-10-CM

## 2012-05-10 MED ORDER — PREDNISONE (PAK) 10 MG PO TABS: ORAL_TABLET | ORAL | Status: AC

## 2012-05-10 NOTE — Progress Notes (Addendum)
Subjective:     Patient ID: Brent Wade, male   DOB: [birth year]   MRN: [id number]  HPI  64  yowm quit smoking 2004 due to sob got some better after that but required spiriva and albuterol with gradually increase need for the latter over the years with only one block tol without stopping.   July 16, 2010 1st pulmonary office eval after recent flare assoc with nasal congestion also rx with prednisone started on dulera by primary care and now back near nl. minimal mucoid sputum in am, no noct exac but still using lots of ventolin and concerned he's using too much.  PFT's with 23% better p B2  rec Continue Dulera but increase the strength to the 200 and use it 2 puffs first thing in am and 2 puffs again in pm about 12 hours later   August 13, 2010 4 wk followup. Pt states that he feels he is able to do more activity without getting out of breath. No complaints today. Has only used ventolin x 2 sionce last seen. no nocturnal symptoms. wants to try off spiriva. sob in pm's when walks in heat, o/w ok. rec Try off spiriva remember it's like buying high octane fuel  stay on dulera 200 2 puffs first thing in am and 2 puffs again in pm about 12 hours later  Found dulera too expensive so d/c'd around Oct 1, continued spiriva and no change in doe but uses ventolin again where didn't need it on dulera.   November 12, 2010 ov breathing ok but back using ventolin daily, esp in pm, p stopped dulera samples due to price of rx. rec  start symbicort 160 2 puffs first thing in am and 2 puffs again in pm about 12 hours later you should notice better better breathing and less need for ventolin     03/27/2012 f/u ov/Zoanne Newill cc breathing worse gradually x months assoc with chest congestion esp in am p waking  at usual time with no noct disturbance> sev tbsp of white mucus over 30 min then some beter the rest of the day. Using saba sev times daily. No overt sinus or hb symtpoms rec Prednisone 10 mg take  4 each am x 2  days,   2 each am x 2 days,  1 each am x2days and stop  Symbicort 160 Take 2 puffs first thing in am and then another 2 puffs about 12 hours later.  Follow symbicort in am immediately by spiriva  Pepcid 20 mg one at bedtime GERD diet   05/10/2012 f/u ov/Calvyn Kurtzman cc sob/congestion says prednisone helped a lot but not back to normal baseline and using symbicort each am and using proventil up to sev times during the day only, not using any mucolytics.  No obvious daytime variabilty or assoc   chest tightness, subjective wheeze overt sinus or hb symptoms. No unusual exp hx or    Sleeping ok without nocturnal  or early am exacerbation  of respiratory  c/o's or need for noct saba. Also denies any obvious fluctuation of symptoms with weather or environmental changes or other aggravating or alleviating factors except as outlined above   ROS  At present neg for  any significant sore throat, dysphagia, dental problems, itching, sneezing,  nasal congestion or excess/ purulent secretions, ear ache,   fever, chills, sweats, unintended wt loss, pleuritic or exertional cp, hemoptysis, palpitations, orthopnea pnd or leg swelling.  Also denies presyncope, palpitations, heartburn, abdominal pain, anorexia, nausea, vomiting, diarrhea  or change in bowel or urinary habits, change in stools or urine, dysuria,hematuria,  rash, arthralgias, visual complaints, headache, numbness weakness or ataxia or problems with walking or coordination. No noted change in mood/affect or memory.                   Past Medical History:  HYPERLIPIDEMIA (ICD-272.4)  DM (ICD-250.00)  COPD (ICD-496)  - HFA 90% 07/06/2011  - PFT's August 13, 2010 FEV1 .93 (34%) ratio 37 with 23 % better p B2 DLC0 69%  ? RUL Nodule 07/06/2011 > placed in tickle file for recall @ 03/2012   Social History:  Smoking Status: quit  Packs/Day: 2.0  Alcohol drinks/day: 0    Family History ? Lung Ca Brother  ? Brain Ca Sister        Objective:    Physical Exam  Amb pleasant elderly wm nad  wt 189 July 16, 2010 >    194 09/24/2011 > 03/27/2012  192 > 05/10/2012  191 HEENT mild turbinate edema. Oropharynx edentuous with dentures in place but no thrush or excess pnd or cobblestoning. No JVD or cervical adenopathy. Mild accessory muscle hypertrophy. Trachea midline, nl thryroid. Chest was hyperinflated by percussion with diminished breath sounds and moderate increased exp time without wheeze. Hoover sign positive at mid inspiration. Regular rate and rhythm without murmur gallop or rub or increase P2 or edema. Abd: no hsm, nl excursion. Ext warm without cyanosis or clubbing  cxr 07/06/2011 Question right upper lobe nodule. Given the history of smoking, CT scan of the chest is recommended for better characterization.   CXR  05/10/2012 :   1. Resolved lingula pneumonia. No acute cardiopulmonary abnormality. 2. Vague right upper lobe nodular density is stable and I favor benign.   Assessment:      Plan:

## 2012-05-10 NOTE — Assessment & Plan Note (Signed)
-   HFA 90% 05/10/2012  - PFT's August 13, 2010 FEV1 .93 (34%) ratio 37 with 23 % better p B2 DLC0 69%  - PFT's 03/27/2012  FEV1  0.84 (31%) ratio 36 and 28% better p B2,  DLC0 67%  GOLD III with sign reversible component and partial improvement p 6 days of prednisone not back to baseline but now sleeping better without need for noct or early am albuterol  The proper method of use, as well as anticipated side effects, of a metered-dose inhaler are discussed and demonstrated to the patient. Improved effectiveness after extensive coaching during this visit to a level of approximately  90%  Will try to extend the prednisone to 8 days and see if looses ground p complete rx if so consider allergy profile and sinus ct next steps

## 2012-05-10 NOTE — Assessment & Plan Note (Signed)
-   See cxr 07/06/2011 >  No change 09/24/11 > no change 03/27/2012   F/u cxr due today.  Clearly not an operative candidate.

## 2012-05-10 NOTE — Patient Instructions (Signed)
For cough, congestion, thick mucus use mucinex 600 mg 1-2 every 12hours as needed   Prednisone 10 mg take  4 each am x 2 days,  3 x days,   2 each am x 2 days,  1 each am x2days and stop   Please remember to go to x-ray department downstairs for your tests - we will call you with the results when they are available.  Please schedule a follow up visit in 3 months but call sooner if needed

## 2012-05-16 ENCOUNTER — Encounter: Payer: Self-pay | Admitting: *Deleted

## 2012-05-16 NOTE — Progress Notes (Signed)
Quick Note:  ATC, NA and no option to leave a msg, will send letter. ______

## 2012-08-10 ENCOUNTER — Ambulatory Visit (INDEPENDENT_AMBULATORY_CARE_PROVIDER_SITE_OTHER): Payer: Medicare Other | Admitting: Internal Medicine

## 2012-08-10 ENCOUNTER — Encounter: Payer: Self-pay | Admitting: Internal Medicine

## 2012-08-10 VITALS — BP 120/70 | HR 102 | Temp 97.8°F | Ht 67.0 in | Wt 182.8 lb

## 2012-08-10 DIAGNOSIS — J449 Chronic obstructive pulmonary disease, unspecified: Secondary | ICD-10-CM

## 2012-08-10 DIAGNOSIS — J4489 Other specified chronic obstructive pulmonary disease: Secondary | ICD-10-CM

## 2012-08-10 NOTE — Patient Instructions (Signed)
Please see patient coordinator before you leave today  to schedule refer to rehab  No change in meds  Please schedule a follow up visit in 3 months but call sooner if needed

## 2012-08-10 NOTE — Assessment & Plan Note (Signed)
-   HFA 90% 05/10/2012  - PFT's August 13, 2010 FEV1 .93 (34%) ratio 37 with 23 % better p B2 DLC0 69%  - PFT's 03/27/2012  FEV1  0.84 (31%) ratio 36 and 28% better p B2,  DLC0 67% - 08/10/2012  Walked RA x 3 laps @ 185 ft each stopped due to end of study, no desat - referred to rehab 08/10/2012    GOLD III, no 02 or steroid dep component, Adequate control on present rx, reviewed > rehab next step

## 2012-08-10 NOTE — Progress Notes (Signed)
Subjective:     Patient ID: Brent Wade, male   DOB: 1941/08/16   MRN: [id number]  HPI  65  yowm quit smoking 2004 due to sob got some better after that but required spiriva and albuterol with gradually increase need for the latter over the years with only one block tol without stopping.   July 16, 2010 1st pulmonary office eval after recent flare assoc with nasal congestion also rx with prednisone started on dulera by primary care and now back near nl. minimal mucoid sputum in am, no noct exac but still using lots of ventolin and concerned he's using too much.  PFT's with 23% better p B2  rec Continue Dulera but increase the strength to the 200 and use it 2 puffs first thing in am and 2 puffs again in pm about 12 hours later   August 13, 2010 4 wk followup. Pt states that he feels he is able to do more activity without getting out of breath. No complaints today. Has only used ventolin x 2 sionce last seen. no nocturnal symptoms. wants to try off spiriva. sob in pm's when walks in heat, o/w ok. rec Try off spiriva remember it's like buying high octane fuel  stay on dulera 200 2 puffs first thing in am and 2 puffs again in pm about 12 hours later  Found dulera too expensive so d/c'd around Oct 1, continued spiriva and no change in doe but uses ventolin again where didn't need it on dulera.   November 12, 2010 ov breathing ok but back using ventolin daily, esp in pm, p stopped dulera samples due to price of rx. rec  start symbicort 160 2 puffs first thing in am and 2 puffs again in pm about 12 hours later you should notice better better breathing and less need for ventolin     03/27/2012 f/u ov/Brent Wade cc breathing worse gradually x months assoc with chest congestion esp in am p waking  at usual time with no noct disturbance> sev tbsp of white mucus over 30 min then some beter the rest of the day. Using saba sev times daily. No overt sinus or hb symtpoms rec Prednisone 10 mg take  4 each am x 2  days,   2 each am x 2 days,  1 each am x2days and stop  Symbicort 160 Take 2 puffs first thing in am and then another 2 puffs about 12 hours later.  Follow symbicort in am immediately by spiriva  Pepcid 20 mg one at bedtime GERD diet   05/10/2012 f/u ov/Brent Wade cc sob/congestion says prednisone helped a lot but not back to normal baseline and using symbicort each am and using proventil up to sev times during the day only, not using any mucolytics.  No obvious daytime variabilty or assoc   chest tightness, subjective wheeze overt sinus or hb symptoms. No unusual exp hx or  rec For cough, congestion, thick mucus use mucinex 600 mg 1-2 every 12hours as needed  Prednisone 10 mg take  4 each am x 2 days,  3 x days,   2 each am x 2 days,  1 each am x2days and stop  Please remember to go to x-ray department      08/10/2012 f/u ov/Brent Wade July cc no change doe x one  Block, uses saba avg once a day when going across a parking lot. No obvious daytime variabilty or assoc chronic cough or cp or chest tightness, subjective wheeze overt sinus or hb  symptoms. No unusual exp hx      Sleeping ok without nocturnal  or early am exacerbation  of respiratory  c/o's or need for noct saba. Also denies any obvious fluctuation of symptoms with weather or environmental changes or other aggravating or alleviating factors except as outlined above   ROS  At present neg for  any significant sore throat, dysphagia, dental problems, itching, sneezing,  nasal congestion or excess/ purulent secretions, ear ache,   fever, chills, sweats, unintended wt loss, pleuritic or exertional cp, hemoptysis, palpitations, orthopnea pnd or leg swelling.  Also denies presyncope, palpitations, heartburn, abdominal pain, anorexia, nausea, vomiting, diarrhea  or change in bowel or urinary habits, change in stools or urine, dysuria,hematuria,  rash, arthralgias, visual complaints, headache, numbness weakness or ataxia or problems with walking or coordination.  No noted change in mood/affect or memory.                   Past Medical History:  HYPERLIPIDEMIA (ICD-272.4)  DM (ICD-250.00)  COPD (ICD-496)  - HFA 90% 07/06/2011  - PFT's August 13, 2010 FEV1 .93 (34%) ratio 37 with 23 % better p B2 DLC0 69%  ? RUL Nodule 07/06/2011 > placed in tickle file for recall @ 03/2012   Social History:  Smoking Status: quit  Packs/Day: 2.0  Alcohol drinks/day: 0    Family History ? Lung Ca Brother  ? Brain Ca Sister        Objective:   Physical Exam  Amb pleasant elderly wm nad  wt 189 July 16, 2010 >    194 09/24/2011 > 03/27/2012  192 > 05/10/2012  191 > 08/10/2012  182 HEENT mild turbinate edema. Oropharynx edentuous with dentures in place but no thrush or excess pnd or cobblestoning. No JVD or cervical adenopathy. Mild accessory muscle hypertrophy. Trachea midline, nl thryroid. Chest was hyperinflated by percussion with diminished breath sounds and moderate increased exp time without wheeze. Hoover sign positive at mid inspiration. Regular rate and rhythm without murmur gallop or rub or increase P2 or edema. Abd: no hsm, nl excursion. Ext warm without cyanosis or clubbing      CXR  05/10/2012 :   1. Resolved lingula pneumonia. No acute cardiopulmonary abnormality. 2. Vague right upper lobe nodular density is stable and I favor benign.   Assessment:      Plan:

## 2012-09-19 ENCOUNTER — Telehealth: Payer: Self-pay | Admitting: Internal Medicine

## 2012-09-19 MED ORDER — DOXYCYCLINE HYCLATE 100 MG PO TABS: 100.0000 mg | ORAL_TABLET | Freq: Two times a day (BID) | ORAL | Status: DC

## 2012-09-19 MED ORDER — PREDNISONE 10 MG PO TABS: ORAL_TABLET | ORAL | Status: DC

## 2012-09-19 NOTE — Telephone Encounter (Signed)
Doxycycline 100 mg twice daily x 7 days Prednisone 10 mg take  4 each am x 2 days,   2 each am x 2 days,  1 each am x2days and stop

## 2012-09-19 NOTE — Telephone Encounter (Signed)
I spoke with pt and he c/o chest congestion, cough w/ thick yellow phlem, slight increase SOB when coughing x couple days. No wheezing, chest tx, f,c,s,b,v. He has been taking mucinex DM and it has helped. I offered but OV but stated he can't get off work to come in. Pt would like to have something called in. Please advise MW thanks  No Known Allergies

## 2012-09-19 NOTE — Telephone Encounter (Signed)
Spoke with pt and notified of recs per MW Pt verbalized understanding Rxs were sent to pharm

## 2012-11-16 ENCOUNTER — Ambulatory Visit (INDEPENDENT_AMBULATORY_CARE_PROVIDER_SITE_OTHER): Payer: Medicare Other | Admitting: Internal Medicine

## 2012-11-16 ENCOUNTER — Ambulatory Visit (INDEPENDENT_AMBULATORY_CARE_PROVIDER_SITE_OTHER)
Admission: RE | Admit: 2012-11-16 | Discharge: 2012-11-16 | Disposition: A | Discharge status: Home / Self Care | Payer: Medicare Other | Source: Ambulatory Visit | Attending: Internal Medicine | Admitting: Internal Medicine

## 2012-11-16 ENCOUNTER — Encounter: Payer: Self-pay | Admitting: Internal Medicine

## 2012-11-16 VITALS — BP 120/72 | HR 99 | Temp 97.1°F | Ht 68.0 in | Wt 174.4 lb

## 2012-11-16 DIAGNOSIS — R911 Solitary pulmonary nodule: Secondary | ICD-10-CM

## 2012-11-16 DIAGNOSIS — J4489 Other specified chronic obstructive pulmonary disease: Secondary | ICD-10-CM

## 2012-11-16 DIAGNOSIS — J449 Chronic obstructive pulmonary disease, unspecified: Secondary | ICD-10-CM

## 2012-11-16 DIAGNOSIS — Z23 Encounter for immunization: Secondary | ICD-10-CM

## 2012-11-16 NOTE — Progress Notes (Signed)
Subjective:     Patient ID: Brent Wade, male   DOB: 10-18-1941   MRN: [id number]  HPI  12  yowm quit smoking 2004 due to sob got some better after that but required spiriva and albuterol with gradually increase need for the latter over the years with only one block tol without stopping.   July 16, 2010 1st pulmonary office eval after recent flare assoc with nasal congestion also rx with prednisone started on dulera by primary care and now back near nl. minimal mucoid sputum in am, no noct exac but still using lots of ventolin and concerned he's using too much.  PFT's with 23% better p B2  rec Continue Dulera but increase the strength to the 200 and use it 2 puffs first thing in am and 2 puffs again in pm about 12 hours later   August 13, 2010 4 wk followup. Pt states that he feels he is able to do more activity without getting out of breath. No complaints today. Has only used ventolin x 2 sionce last seen. no nocturnal symptoms. wants to try off spiriva. sob in pm's when walks in heat, o/w ok. rec Try off spiriva remember it's like buying high octane fuel  stay on dulera 200 2 puffs first thing in am and 2 puffs again in pm about 12 hours later  Found dulera too expensive so d/c'd around Oct 1, continued spiriva and no change in doe but uses ventolin again where didn't need it on dulera.   November 12, 2010 ov breathing ok but back using ventolin daily, esp in pm, p stopped dulera samples due to price of rx. rec  start symbicort 160 2 puffs first thing in am and 2 puffs again in pm about 12 hours later you should notice better better breathing and less need for ventolin     03/27/2012 f/u ov/Cassian Torelli cc breathing worse gradually x months assoc with chest congestion esp in am p waking  at usual time with no noct disturbance> sev tbsp of white mucus over 30 min then some beter the rest of the day. Using saba sev times daily. No overt sinus or hb symtpoms rec Prednisone 10 mg take  4 each am x 2  days,   2 each am x 2 days,  1 each am x2days and stop  Symbicort 160 Take 2 puffs first thing in am and then another 2 puffs about 12 hours later.  Follow symbicort in am immediately by spiriva  Pepcid 20 mg one at bedtime GERD diet   05/10/2012 f/u ov/Virat Prather cc sob/congestion says prednisone helped a lot but not back to normal baseline and using symbicort each am and using proventil up to sev times during the day only, not using any mucolytics.  No obvious daytime variabilty or assoc   chest tightness, subjective wheeze overt sinus or hb symptoms. No unusual exp hx or  rec For cough, congestion, thick mucus use mucinex 600 mg 1-2 every 12hours as needed  Prednisone 10 mg take  4 each am x 2 days,  3 x days,   2 each am x 2 days,  1 each am x2days and stop        08/10/2012 f/u ov/Jace Fermin cc no change doe x one  Block, uses saba avg once a day when going across a parking lot. rec Refer to rehab, no change rx   11/16/2012 f/u ov/Lawyer Washabaugh cc sob, did not want to do rehab, min use of saba off  spiriva for three weeks and no change in activity tolerance which remains acceptable though he is very sedentary.    No obvious daytime variabilty or assoc chronic cough or cp or chest tightness, subjective wheeze overt sinus or hb symptoms. No unusual exp hx      Sleeping ok without nocturnal  or early am exacerbation  of respiratory  c/o's or need for noct saba. Also denies any obvious fluctuation of symptoms with weather or environmental changes or other aggravating or alleviating factors except as outlined above   ROS  At present neg for  any significant sore throat, dysphagia, dental problems, itching, sneezing,  nasal congestion or excess/ purulent secretions, ear ache,   fever, chills, sweats, unintended wt loss, pleuritic or exertional cp, hemoptysis, palpitations, orthopnea pnd or leg swelling.  Also denies presyncope, palpitations, heartburn, abdominal pain, anorexia, nausea, vomiting, diarrhea  or  change in bowel or urinary habits, change in stools or urine, dysuria,hematuria,  rash, arthralgias, visual complaints, headache, numbness weakness or ataxia or problems with walking or coordination. No noted change in mood/affect or memory.                   Past Medical History:  HYPERLIPIDEMIA (ICD-272.4)  DM (ICD-250.00)  COPD (ICD-496)  - HFA 90% 07/06/2011  - PFT's August 13, 2010 FEV1 .93 (34%) ratio 37 with 23 % better p B2 DLC0 69%  - RUL Nodule first detected 07/06/2011    Social History:  Smoking Status: quit  Packs/Day: 2.0  Alcohol drinks/day: 0    Family History ? Lung Ca Brother  ? Brain Ca Sister        Objective:   Physical Exam  Amb pleasant elderly wm nad  wt 189 July 16, 2010 >    194 09/24/2011 >> 08/10/2012  182> 174 11/16/2012  HEENT mild turbinate edema. Oropharynx edentuous with dentures in place but no thrush or excess pnd or cobblestoning. No JVD or cervical adenopathy. Mild accessory muscle hypertrophy. Trachea midline, nl thryroid. Chest was hyperinflated by percussion with diminished breath sounds and moderate increased exp time without wheeze. Hoover sign positive at mid inspiration. Regular rate and rhythm without murmur gallop or rub or increase P2 or edema. Abd: no hsm, nl excursion. Ext warm without cyanosis or clubbing      CXR  11/16/2012 :  10 mm right upper lobe nodule may show mild progressive growth. New area of density right upper hilum. Lung cancer not excluded.     Assessment:      Plan:

## 2012-11-16 NOTE — Patient Instructions (Addendum)
Please remember to go to the x-ray department downstairs for your tests - we will call you with the results when they are available.  Please schedule a follow up visit in 6 months but call sooner if needed   Late add :  rec move up f/u to 3 months with cxr

## 2012-11-16 NOTE — Assessment & Plan Note (Addendum)
-   See cxr 07/06/2011 >  No change 09/24/11 > no change 03/27/2012 > ? slt progression 11/16/2012   Clearly not an operative candidate > f/u in 3 months  Discussed in detail all the  indications, usual  risks and alternatives  relative to the benefits with patient who agrees to proceed with conservative f/u

## 2012-11-16 NOTE — Assessment & Plan Note (Addendum)
-   HFA 90% 05/10/2012  - PFT's August 13, 2010 FEV1 .93 (34%) ratio 37 with 23 % better p B2 DLC0 69%  - PFT's 03/27/2012  FEV1  0.84 (31%) ratio 36 and 28% better p B2,  DLC0 67% - 08/10/2012  Walked RA x 3 laps @ 185 ft each stopped due to end of study, no desat - referred to rehab 08/10/2012  > declined  GOLD III min symptoms, no worse off spiriva x 3 weeks so no need to continue it at this point> leave off and just continue symbicort 160 2 bid

## 2013-02-20 ENCOUNTER — Telehealth: Payer: Self-pay | Admitting: Internal Medicine

## 2013-02-20 NOTE — Telephone Encounter (Signed)
Attempted to call pt x 3 to make next ov per recall.  Pt never returned calls.  Mailed recall letter 02/21/13. Emily E McAlister °

## 2013-03-12 ENCOUNTER — Other Ambulatory Visit: Payer: Self-pay | Admitting: Internal Medicine

## 2013-03-12 MED ORDER — ALBUTEROL SULFATE HFA 108 (90 BASE) MCG/ACT IN AERS: 2.0000 | INHALATION_SPRAY | Freq: Four times a day (QID) | RESPIRATORY_TRACT | Status: DC | PRN

## 2013-03-19 ENCOUNTER — Telehealth: Payer: Self-pay | Admitting: Internal Medicine

## 2013-03-19 NOTE — Telephone Encounter (Signed)
Mailed recall letter after trying to call pt 3 x's to make next ov per recall.  Wrong address.  Recall letter sent back to our office 03/19/13. Emily E McAlister °

## 2013-07-03 ENCOUNTER — Encounter: Payer: Self-pay | Admitting: Internal Medicine

## 2013-07-03 ENCOUNTER — Ambulatory Visit (INDEPENDENT_AMBULATORY_CARE_PROVIDER_SITE_OTHER): Payer: Medicare Other | Admitting: Internal Medicine

## 2013-07-03 ENCOUNTER — Ambulatory Visit (INDEPENDENT_AMBULATORY_CARE_PROVIDER_SITE_OTHER)
Admission: RE | Admit: 2013-07-03 | Discharge: 2013-07-03 | Disposition: A | Discharge status: Home / Self Care | Payer: Medicare Other | Source: Ambulatory Visit | Attending: Internal Medicine | Admitting: Internal Medicine

## 2013-07-03 VITALS — BP 120/70 | HR 102 | Temp 97.0°F | Ht 66.0 in | Wt 174.4 lb

## 2013-07-03 DIAGNOSIS — J449 Chronic obstructive pulmonary disease, unspecified: Secondary | ICD-10-CM

## 2013-07-03 DIAGNOSIS — R911 Solitary pulmonary nodule: Secondary | ICD-10-CM

## 2013-07-03 MED ORDER — ACLIDINIUM BROMIDE 400 MCG/ACT IN AEPB: 1.0000 | INHALATION_SPRAY | Freq: Two times a day (BID) | RESPIRATORY_TRACT | Status: DC

## 2013-07-03 NOTE — Patient Instructions (Addendum)
Continue symbiocrt 160 Take 2 puffs first thing in am and then another 2 puffs about 12 hours later.    Add tudorza one puff twice daily after symbicort  Prednisone 10 mg take  4 each am x 2 days,   2 each am x 2 days,  1 each am x 2 days and stop   Please remember to go to the  x-ray department downstairs for your tests - we will call you with the results when they are available  Please schedule a follow up office visit in 2 weeks, sooner if needed

## 2013-07-03 NOTE — Assessment & Plan Note (Signed)
-   PFT's August 13, 2010 FEV1 .93 (34%) ratio 37 with 23 % better p B2 DLC0 69%  - PFT's 03/27/2012  FEV1  0.84 (31%) ratio 36 and 28% better p B2,  DLC0 67% - 08/10/2012  Walked RA x 3 laps @ 185 ft each stopped due to end of study, no desat - referred to rehab 08/10/2012  > declined  Based on severity needs trial of LAMA  The proper method of use, as well as anticipated side effects, of a metered-dose inhaler are discussed and demonstrated to the patient. Improved effectiveness after extensive coaching during this visit to a level of approximately  75% so continue symbicort and add turdorza then regroup re lung mass

## 2013-07-03 NOTE — Assessment & Plan Note (Signed)
-   See cxr 07/06/2011 >  No change 09/24/11 > no change 03/27/2012 > ? slt progression 11/16/2012 > def progression 07/03/2013   Unfortunately little to offer here in terms of early intervention with worsening doe. The low grade hemoptysis suggests he may have an endobronchial process so will discuss  Fob in 2 weeks  if we can first improve his breathing.

## 2013-07-03 NOTE — Progress Notes (Signed)
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 minimal mucoid sputum in am, no noct exac but still using lots of ventolin and concerned he's using too much.  PFT's with 23% better p B2  rec Continue Dulera but increase the strength to the 200 and use it 2 puffs first thing in am and 2 puffs again in pm about 12 hours later    08/10/2012 f/u ov/Anecia Nusbaum cc no change doe x one  Block, uses saba avg once a day when going across a parking lot. rec Refer to rehab> declined   11/16/2012 f/u ov/Kowen Kluth cc sob, did not want to do rehab, min use of saba off spiriva for three weeks and no change in activity tolerance which remains acceptable though he is very sedentary. rec No change rx F/u cxr in 3 mo> Attempted to call pt x 3 to make next ov per recall.  Pt never returned calls.  Mailed recall letter 02/21/13.   07/03/2013 f/u ov/Lindzie Boxx re copd GOLD III Chief Complaint  Patient presents with  . Follow-up    Pt states breathing worse for the past 3 months. He states gets out of breath with minimal exertion such as walking 1/2 block on flat surface.   still taking symbicort 160 2bid  stl bloody mucus in am's first noted x 2 weeks waxes     No obvious daytime variabilty or assoc  cp or chest tightness, subjective wheeze overt sinus or hb symptoms. No unusual exp hx      Sleeping ok without nocturnal  or early am exacerbation  of respiratory  c/o's or need for noct saba. Also denies any obvious fluctuation of symptoms with weather or  environmental changes or other aggravating or alleviating factors except as outlined above    Current Medications, Allergies, Complete Past Medical History, Past Surgical History, Family History, and Social History were reviewed in [employer] record.  ROS  The following are not active complaints unless bolded sore throat, dysphagia, dental problems, itching, sneezing,  nasal congestion or excess/ purulent secretions, ear ache,   fever, chills, sweats, unintended wt loss, pleuritic or exertional cp, hemoptysis,  orthopnea pnd or leg swelling, presyncope, palpitations, heartburn, abdominal pain, anorexia, nausea, vomiting, diarrhea  or change in bowel or urinary habits, change in stools or urine, dysuria,hematuria,  rash, arthralgias, visual complaints, headache, numbness weakness or ataxia or problems with walking or coordination,  change in mood/affect or memory.           Past Medical History:  HYPERLIPIDEMIA (ICD-272.4)  DM (ICD-250.00)  COPD (ICD-496)  - HFA 90% 07/06/2011  - PFT's August 13, 2010 FEV1 .93 (34%) ratio 37 with 23 % better p B2 DLC0 69%  - RUL Nodule first detected 07/06/2011    Social History:  Smoking Status: quit  Packs/Day: 2.0  Alcohol drinks/day: 0    Family History ? Lung Ca Brother  ? Brain Ca Sister  Objective:   Physical Exam  Amb pleasant but increasingly frail elderly hoarse wm nad  wt 189 July 16, 2010 >    194 09/24/2011 >> 08/10/2012  182> 174 11/16/2012 > 07/03/2013 174 HEENT mild turbinate edema. Oropharynx edentuous with dentures in place but no thrush or excess pnd or cobblestoning. No JVD or cervical adenopathy. Mild accessory muscle hypertrophy. Trachea midline, nl thryroid. Chest was hyperinflated by percussion with diminished breath sounds and moderate increased exp time without wheeze. Hoover sign positive at mid inspiration. Regular rate and rhythm without murmur gallop or rub or increase P2 or edema. Abd:  no hsm, nl excursion. Ext warm without cyanosis or clubbing      CXR  07/03/2013 :  Nodular density in right upper lobe appears larger than on previous study. This is concerning for neoplastic disease.  There are infiltrative opacities and airspace disease densities increased in the right upper lobe and upper right perihilar region consistent with pneumonia. Neoplasm cannot be excluded.  There is generalized hyperinflation configuration consistent with COPD.      Assessment:

## 2013-07-04 ENCOUNTER — Telehealth: Payer: Self-pay | Admitting: Internal Medicine

## 2013-07-04 MED ORDER — PREDNISONE 10 MG PO TABS: ORAL_TABLET | ORAL | Status: DC

## 2013-07-04 NOTE — Telephone Encounter (Signed)
rx sent. Pt is aware. Jennifer Castillo, CMA  

## 2013-07-17 ENCOUNTER — Ambulatory Visit (INDEPENDENT_AMBULATORY_CARE_PROVIDER_SITE_OTHER): Payer: Medicare Other | Admitting: Internal Medicine

## 2013-07-17 ENCOUNTER — Encounter: Payer: Self-pay | Admitting: Internal Medicine

## 2013-07-17 VITALS — BP 136/82 | HR 81 | Temp 98.0°F | Ht 68.0 in | Wt 180.8 lb

## 2013-07-17 DIAGNOSIS — R911 Solitary pulmonary nodule: Secondary | ICD-10-CM

## 2013-07-17 DIAGNOSIS — J449 Chronic obstructive pulmonary disease, unspecified: Secondary | ICD-10-CM

## 2013-07-17 MED ORDER — PREDNISONE (PAK) 10 MG PO TABS: ORAL_TABLET | ORAL | Status: DC

## 2013-07-17 MED ORDER — AMOXICILLIN-POT CLAVULANATE 875-125 MG PO TABS: 1.0000 | ORAL_TABLET | Freq: Two times a day (BID) | ORAL | Status: DC

## 2013-07-17 NOTE — Patient Instructions (Addendum)
Stop tudorza   Augmentin 875 twice daily x 10 days  Prednisone 10 mg take  4 each am x 2 days,   2 each am x 2 days,  1 each am x 2 days and stop  Please see patient coordinator before you leave today  to schedule CT of chest and sinus in 4 weeks and office visit later same day

## 2013-07-17 NOTE — Assessment & Plan Note (Signed)
-   PFT's August 13, 2010 FEV1 .93 (34%) ratio 37 with 23 % better p B2 DLC0 69%  - PFT's 03/27/2012  FEV1  0.84 (31%) ratio 36 and 28% better p B2,  DLC0 67% - 08/10/2012  Walked RA x 3 laps @ 185 ft each stopped due to end of study, no desat - referred to rehab 08/10/2012  > declined - hfa 75% 07/03/2013 > added tudorza ? Response > try off 07/17/2013   Not really convinced better from turdoraz > try back off

## 2013-07-17 NOTE — Assessment & Plan Note (Addendum)
-   See cxr 07/06/2011 >  No change 09/24/11 > no change 03/27/2012 > ? slt progression 11/16/2012 > def progression 07/03/2013 - CT chest ordered 07/17/2013 >>  Clearly not an operative candidate but could obtain tissue dx and consider palliative RT esp in view of hemptysis. Also need to see if this is just bronchiectasis > rx as bronchiectasis then ct next step then consider fob

## 2013-07-17 NOTE — Progress Notes (Signed)
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 minimal mucoid sputum in am, no noct exac but still using lots of ventolin and concerned he's using too much.  PFT's with 23% better p B2  rec Continue Dulera but increase the strength to the 200 and use it 2 puffs first thing in am and 2 puffs again in pm about 12 hours later    08/10/2012 f/u ov/Mckenze Slone cc no change doe x one  Block, uses saba avg once a day when going across a parking lot. rec Refer to rehab> declined   11/16/2012 f/u ov/Aime Meloche cc sob, did not want to do rehab, min use of saba off spiriva for three weeks and no change in activity tolerance which remains acceptable though he is very sedentary. rec No change rx F/u cxr in 3 mo> Attempted to call pt x 3 to make next ov per recall.  Pt never returned calls.  Mailed recall letter 02/21/13.   07/03/2013 f/u ov/Gwynne Kemnitz re copd GOLD III Chief Complaint  Patient presents with  . Follow-up    Pt states breathing worse for the past 3 months. He states gets out of breath with minimal exertion such as walking 1/2 block on flat surface.   still taking symbicort 160 2bid  stl bloody mucus in am's first noted x 2 weeks waxes  rec Continue symbiocrt 160 Take 2 puffs first thing in am and then another 2 puffs about 12 hours later.  Add tudorza one puff twice daily after symbicort Prednisone 10 mg take  4 each am x 2 days,   2 each am x 2 days,  1 each am x 2 days and stop    07/17/2013 f/u ov/Dorsel Flinn re copd/ abn cxr  Chief Complaint   Patient presents with  . COPD    f/u. Pt states he feels some inprovement in SOB since taking the tudorza. Pt states he still has some chest congestion and SOB with activity.    not really clear whether tudorza helped vs effects of short course of prednisone as worse off prednisone back near baseline in terms of sob. No more hemoptysis but continues to cough up slt discolored sputum up to a couple of tbsp per day esp in am  No obvious daytime variabilty or assoc  cp or chest tightness, subjective wheeze overt sinus or hb symptoms. No unusual exp hx      Sleeping ok without nocturnal  or early am exacerbation  of respiratory  c/o's or need for noct saba. Also denies any obvious fluctuation of symptoms with weather or environmental changes or other aggravating or alleviating factors except as outlined above    Current Medications, Allergies, Complete Past Medical History, Past Surgical History, Family History, and Social History were reviewed in [employer] record.  ROS  The following are not active complaints unless bolded sore throat, dysphagia, dental problems, itching, sneezing,  nasal congestion or excess/ purulent secretions, ear ache,  fever, chills, sweats, unintended wt loss, pleuritic or exertional cp, hemoptysis,  orthopnea pnd or leg swelling, presyncope, palpitations, heartburn, abdominal pain, anorexia, nausea, vomiting, diarrhea  or change in bowel or urinary habits, change in stools or urine, dysuria,hematuria,  rash, arthralgias, visual complaints, headache, numbness weakness or ataxia or problems with walking or coordination,  change in mood/affect or memory.           Past Medical History:  HYPERLIPIDEMIA (ICD-272.4)  DM (ICD-250.00)  COPD (ICD-496)  - HFA 90% 07/06/2011  - PFT's August 13, 2010 FEV1 .93 (34%) ratio 37 with 23 % better p B2 DLC0 69%  - RUL Nodule first detected 07/06/2011    Social History:  Smoking Status: quit  Packs/Day: 2.0   Alcohol drinks/day: 0    Family History ? Lung Ca Brother  ? Brain Ca Sister        Objective:   Physical Exam  Amb pleasant but increasingly frail elderly hoarse wm nad  wt 189 July 16, 2010 >    194 09/24/2011 >> 08/10/2012  182> 174 11/16/2012 > 07/03/2013 174 > 07/17/2013  181  HEENT mild turbinate edema. Oropharynx edentuous with dentures in place but no thrush or excess pnd or cobblestoning. No JVD or cervical adenopathy. Mild accessory muscle hypertrophy. Trachea midline, nl thryroid. Chest was hyperinflated by percussion with diminished breath sounds and moderate increased exp time without wheeze. Hoover sign positive at mid inspiration. Regular rate and rhythm without murmur gallop or rub or increase P2 or edema. Abd: no hsm, nl excursion. Ext warm without cyanosis or clubbing              Assessment:

## 2013-07-18 ENCOUNTER — Other Ambulatory Visit: Payer: Self-pay

## 2013-08-15 ENCOUNTER — Ambulatory Visit: Payer: Medicare Other | Admitting: Internal Medicine

## 2013-08-15 ENCOUNTER — Other Ambulatory Visit: Payer: Medicare Other

## 2013-08-16 ENCOUNTER — Ambulatory Visit (INDEPENDENT_AMBULATORY_CARE_PROVIDER_SITE_OTHER): Payer: Medicare Other | Admitting: Internal Medicine

## 2013-08-16 ENCOUNTER — Encounter: Payer: Self-pay | Admitting: Internal Medicine

## 2013-08-16 ENCOUNTER — Ambulatory Visit (INDEPENDENT_AMBULATORY_CARE_PROVIDER_SITE_OTHER)
Admission: RE | Admit: 2013-08-16 | Discharge: 2013-08-16 | Disposition: A | Discharge status: Home / Self Care | Payer: Medicare Other | Source: Ambulatory Visit | Attending: Internal Medicine | Admitting: Internal Medicine

## 2013-08-16 VITALS — BP 144/88 | HR 99 | Temp 97.5°F | Ht 68.0 in | Wt 179.0 lb

## 2013-08-16 DIAGNOSIS — R911 Solitary pulmonary nodule: Secondary | ICD-10-CM

## 2013-08-16 DIAGNOSIS — J449 Chronic obstructive pulmonary disease, unspecified: Secondary | ICD-10-CM

## 2013-08-16 DIAGNOSIS — R05 Cough: Secondary | ICD-10-CM | POA: Insufficient documentation

## 2013-08-16 DIAGNOSIS — Z23 Encounter for immunization: Secondary | ICD-10-CM

## 2013-08-16 MED ORDER — PREDNISONE (PAK) 10 MG PO TABS: ORAL_TABLET | ORAL | Status: DC

## 2013-08-16 MED ORDER — IOHEXOL 300 MG/ML  SOLN
80.0000 mL | Freq: Once | INTRAMUSCULAR | Status: AC | PRN
  Administered 2013-08-16: 80 mL via INTRAVENOUS

## 2013-08-16 NOTE — Progress Notes (Signed)
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 minimal mucoid sputum in am, no noct exac but still using lots of ventolin and concerned he's using too much.  PFT's with 23% better p B2  rec Continue Dulera but increase the strength to the 200 and use it 2 puffs first thing in am and 2 puffs again in pm about 12 hours later    08/10/2012 f/u ov/Brent Wade cc no change doe x one  Block, uses saba avg once a day when going across a parking lot. rec Refer to rehab> declined   11/16/2012 f/u ov/Kira Hartl cc sob, did not want to do rehab, min use of saba off spiriva for three weeks and no change in activity tolerance which remains acceptable though he is very sedentary. rec No change rx F/u cxr in 3 mo> Attempted to call pt x 3 to make next ov per recall.  Pt never returned calls.  Mailed recall letter 02/21/13.   07/03/2013 f/u ov/Brent Wade re copd GOLD III Chief Complaint  Patient presents with  . Follow-up    Pt states breathing worse for the past 3 months. He states gets out of breath with minimal exertion such as walking 1/2 block on flat surface.   still taking symbicort 160 2bid  stl bloody mucus in am's first noted x 2 weeks waxes  rec Continue symbiocrt 160 Take 2 puffs first thing in am and then another 2 puffs about 12 hours later.  Add tudorza one puff twice daily after symbicort Prednisone 10 mg take  4 each am x 2 days,   2 each am x 2 days,  1 each am x 2 days and stop    07/17/2013 f/u ov/Brent Wade re copd/ abn cxr  Chief Complaint   Patient presents with  . COPD    f/u. Pt states he feels some inprovement in SOB since taking the tudorza. Pt states he still has some chest congestion and SOB with activity.    not really clear whether tudorza helped vs effects of short course of prednisone as worse off prednisone back near baseline in terms of sob. No more hemoptysis but continues to cough up slt discolored sputum up to a couple of tbsp per day esp in am rec Stop tudorza  Augmentin 875 twice daily x 10 days Prednisone 10 mg take  4 each am x 2 days,   2 each am x 2 days,  1 each am x 2 days and stop   08/16/2013 f/u ov/Brent Wade re copd/ lung mass Chief Complaint  Patient presents with  . Follow-up    Pt had ct chest this am. Reports breathing is the same and denies any new co's today.    No obvious daytime variabilty or assoc cough/ hemoptysis/   cp or chest tightness, subjective wheeze overt sinus or hb symptoms. No unusual exp hx      Sleeping ok without nocturnal  or early am exacerbation  of respiratory  c/o's  or need for noct saba. Also denies any obvious fluctuation of symptoms with weather or environmental changes or other aggravating or alleviating factors except as outlined above    Current Medications, Allergies, Complete Past Medical History, Past Surgical History, Family History, and Social History were reviewed in [employer] record.  ROS  The following are not active complaints unless bolded sore throat, dysphagia, dental problems, itching, sneezing,  nasal congestion or excess/ purulent secretions, ear ache,   fever, chills, sweats, unintended wt loss, pleuritic or exertional cp, hemoptysis,  orthopnea pnd or leg swelling, presyncope, palpitations, heartburn, abdominal pain, anorexia, nausea, vomiting, diarrhea  or change in bowel or urinary habits, change in stools or urine, dysuria,hematuria,  rash, arthralgias, visual complaints, headache, numbness weakness or ataxia or problems with  walking or coordination,  change in mood/affect or memory.           Past Medical History:  HYPERLIPIDEMIA (ICD-272.4)  DM (ICD-250.00)  COPD (ICD-496)  - HFA 90% 07/06/2011  - PFT's August 13, 2010 FEV1 .93 (34%) ratio 37 with 23 % better p B2 DLC0 69%  - RUL Nodule first detected 07/06/2011    Social History:  Smoking Status: quit  Packs/Day: 2.0  Alcohol drinks/day: 0    Family History ? Lung Ca Brother  ? Brain Ca Sister        Objective:   Physical Exam  Amb pleasant but increasingly frail elderly hoarse wm nad  wt 189 July 16, 2010 >    194 09/24/2011 >> 08/10/2012  182> 174 11/16/2012 > 07/03/2013 174 > 07/17/2013  181 >  08/16/2013  179  HEENT mild turbinate edema. Oropharynx edentuous with dentures in place but no thrush or excess pnd or cobblestoning. No JVD or cervical adenopathy. Mild accessory muscle hypertrophy. Trachea midline, nl thryroid. Chest was hyperinflated by percussion with diminished breath sounds and moderate increased exp time without wheeze. Hoover sign positive at mid inspiration. Regular rate and rhythm without murmur gallop or rub or increase P2 or edema. Abd: no hsm, nl excursion. Ext warm without cyanosis or clubbing        ct chest 08/16/13 1. Aggressive appearing right upper lobe pulmonary mass and nodule  with a right hilar lymphadenopathy, as detailed above. This is  favored to represent a primary bronchogenic neoplasm with nodal  metastasis to the right hilum and satellite nodule in the same lobe  (i.e., T3, N1, Mx disease), however, this could alternatively  represent synchronous right upper lobe neoplasms, or less likely  an aggressive atypical infectious process such as a fungal  pneumonia (not strongly favored). Clinical correlation is strongly  recommended, with consideration for PET CT and/or biopsy at this  time.  2. In addition, there is a nonspecific 1.7 x 1.4 cm ground-glass  attenuation nodule in the left lower lobe which  warrants attention  on follow-up studies       Assessment:

## 2013-08-16 NOTE — Patient Instructions (Addendum)
Prednisone 10 mg take  4 each am x 2 days,   2 each am x 2 days,  1 each am x 2 days and stop  Bronchoscopy scheduled :  Enter through Outpatient registration at 715 am [facility] on Tuesday 9/9 with nothing to eat or drink after midnight Monday  Add turdorza one twice daily back x 4 week samples

## 2013-08-19 NOTE — Assessment & Plan Note (Signed)
-   PFT's August 13, 2010 FEV1 .93 (34%) ratio 37 with 23 % better p B2 DLC0 69%  - PFT's 03/27/2012  FEV1  0.84 (31%) ratio 36 and 28% better p B2,  DLC0 67% - 08/10/2012  Walked RA x 3 laps @ 185 ft each stopped due to end of study, no desat - referred to rehab 08/10/2012  > declined - hfa 75% 07/03/2013 > added tudorza ? Response > try off 07/17/2013   Adequate control on present rx, reviewed > no change in rx needed

## 2013-08-20 ENCOUNTER — Encounter (HOSPITAL_COMMUNITY): Payer: Self-pay

## 2013-08-21 ENCOUNTER — Ambulatory Visit (HOSPITAL_COMMUNITY)
Admission: RE | Admit: 2013-08-21 | Discharge: 2013-08-21 | Disposition: A | Discharge status: Home / Self Care | Payer: Medicare Other | Source: Ambulatory Visit | Attending: Internal Medicine | Admitting: Internal Medicine

## 2013-08-21 ENCOUNTER — Ambulatory Visit (HOSPITAL_COMMUNITY): Payer: Medicare Other

## 2013-08-21 ENCOUNTER — Encounter (HOSPITAL_COMMUNITY): Payer: Self-pay | Admitting: Respiratory Therapy

## 2013-08-21 ENCOUNTER — Encounter (HOSPITAL_COMMUNITY)
Admission: RE | Disposition: A | Discharge status: Home / Self Care | Payer: Self-pay | Source: Ambulatory Visit | Attending: Internal Medicine

## 2013-08-21 DIAGNOSIS — E119 Type 2 diabetes mellitus without complications: Secondary | ICD-10-CM | POA: Insufficient documentation

## 2013-08-21 DIAGNOSIS — R911 Solitary pulmonary nodule: Secondary | ICD-10-CM

## 2013-08-21 DIAGNOSIS — R222 Localized swelling, mass and lump, trunk: Secondary | ICD-10-CM | POA: Insufficient documentation

## 2013-08-21 DIAGNOSIS — J449 Chronic obstructive pulmonary disease, unspecified: Secondary | ICD-10-CM | POA: Insufficient documentation

## 2013-08-21 DIAGNOSIS — R599 Enlarged lymph nodes, unspecified: Secondary | ICD-10-CM | POA: Insufficient documentation

## 2013-08-21 DIAGNOSIS — J4489 Other specified chronic obstructive pulmonary disease: Secondary | ICD-10-CM | POA: Insufficient documentation

## 2013-08-21 DIAGNOSIS — E785 Hyperlipidemia, unspecified: Secondary | ICD-10-CM | POA: Insufficient documentation

## 2013-08-21 DIAGNOSIS — R093 Abnormal sputum: Secondary | ICD-10-CM | POA: Insufficient documentation

## 2013-08-21 HISTORY — PX: VIDEO BRONCHOSCOPY: SHX5072

## 2013-08-21 SURGERY — BRONCHOSCOPY, WITH FLUOROSCOPY
Anesthesia: Moderate Sedation | Laterality: Bilateral

## 2013-08-21 MED ORDER — PHENYLEPHRINE HCL 0.25 % NA SOLN
NASAL | Status: DC | PRN
  Administered 2013-08-21: 2 via NASAL

## 2013-08-21 MED ORDER — LIDOCAINE HCL (PF) 1 % IJ SOLN
INTRAMUSCULAR | Status: DC | PRN
  Administered 2013-08-21: 6 mL

## 2013-08-21 MED ORDER — MEPERIDINE HCL 25 MG/ML IJ SOLN
INTRAMUSCULAR | Status: DC | PRN
  Administered 2013-08-21: 25 mg via INTRAVENOUS

## 2013-08-21 MED ORDER — MIDAZOLAM HCL 10 MG/2ML IJ SOLN
INTRAMUSCULAR | Status: DC | PRN
  Administered 2013-08-21: 2.5 mg via INTRAVENOUS

## 2013-08-21 MED ORDER — MIDAZOLAM HCL 10 MG/2ML IJ SOLN: 1.0000 mg | Freq: Once | INTRAMUSCULAR | Status: DC

## 2013-08-21 MED ORDER — SODIUM CHLORIDE 0.9 % IV SOLN
INTRAVENOUS | Status: DC
  Administered 2013-08-21: 08:00:00 via INTRAVENOUS

## 2013-08-21 MED ORDER — LIDOCAINE HCL 2 % EX GEL
CUTANEOUS | Status: DC | PRN
  Administered 2013-08-21: 1 via TOPICAL

## 2013-08-21 MED ORDER — MEPERIDINE HCL 100 MG/ML IJ SOLN
INTRAMUSCULAR | Status: AC
  Filled 2013-08-21: qty 2

## 2013-08-21 MED ORDER — MIDAZOLAM HCL 10 MG/2ML IJ SOLN
INTRAMUSCULAR | Status: AC
  Filled 2013-08-21: qty 4

## 2013-08-21 MED ORDER — MEPERIDINE HCL 100 MG/ML IJ SOLN: 100.0000 mg | Freq: Once | INTRAMUSCULAR | Status: DC

## 2013-08-21 NOTE — H&P (Signed)
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 minimal mucoid sputum in am, no noct exac but still using lots of ventolin and concerned he's using too much.   PFT's with 23% better p B2   rec Continue Dulera but increase the strength to the 200 and use it 2 puffs first thing in am and 2 puffs again in pm about 12 hours later     08/10/2012 f/u ov/Brent Wade cc no change doe x one  Block, uses saba avg once a day when going across a parking lot. rec Refer to rehab> declined     11/16/2012 f/u ov/Brent Wade cc sob, did not want to do rehab, min use of saba off spiriva for three weeks and no change in activity tolerance which remains acceptable though he is very sedentary. rec No change rx F/u cxr in 3 mo> Attempted to call pt x 3 to make next ov per recall.  Pt never returned calls.  Mailed recall letter 02/21/13.    07/03/2013 f/u ov/Brent Wade re copd GOLD III Chief Complaint   Patient presents with   .  Follow-up       Pt states breathing worse for the past 3 months. He states gets out of breath with minimal exertion such as walking 1/2 block on flat surface.     still taking symbicort 160 2bid   stl bloody mucus in am's first noted x 2 weeks waxes   rec Continue symbiocrt 160 Take 2 puffs first thing in am and then another 2 puffs about 12 hours later.   Add tudorza one puff twice daily after symbicort Prednisone 10 mg take  4 each am x 2 days,   2 each am x 2 days,  1 each am x 2 days and stop      07/17/2013 f/u ov/Brent Wade re copd/ abn cxr    Chief Complaint   Patient presents with   .  COPD       f/u. Pt states he feels some inprovement in SOB since taking the tudorza. Pt states he still has some chest congestion and SOB with activity.      not really clear whether tudorza helped vs effects of short course of prednisone as worse off prednisone back near baseline in terms of sob. No more hemoptysis but continues to cough up slt discolored sputum up to a couple of tbsp per day esp in am rec Stop tudorza   Augmentin 875 twice daily x 10 days Prednisone 10 mg take  4 each am x 2 days,   2 each am x 2 days,  1 each am x 2 days and stop     08/16/2013 f/u ov/Brent Wade re copd/ lung mass Chief Complaint   Patient presents with   .  Follow-up       Pt had ct chest this am. Reports breathing is the same and denies any new co's today.       No obvious daytime variabilty or  assoc cough/ hemoptysis/   cp or chest tightness, subjective wheeze overt sinus or hb symptoms. No unusual exp hx        Sleeping ok without nocturnal  or early am exacerbation  of respiratory  c/o's or need for noct saba. Also denies any obvious fluctuation of symptoms with weather or environmental changes or other aggravating or alleviating factors except as outlined above       Current Medications, Allergies, Complete Past Medical History, Past Surgical History, Family History, and Social History were reviewed in [employer] record.   ROS  The following are not active complaints unless bolded sore throat, dysphagia, dental problems, itching, sneezing,  nasal congestion or excess/ purulent secretions, ear ache,   fever, chills, sweats, unintended wt loss, pleuritic or exertional cp, hemoptysis,  orthopnea pnd or leg swelling, presyncope, palpitations, heartburn, abdominal pain, anorexia, nausea, vomiting, diarrhea  or change in bowel or urinary habits, change in stools or urine, dysuria,hematuria,  rash, arthralgias, visual complaints,  headache, numbness weakness or ataxia or problems with walking or coordination,  change in mood/affect or memory.              Past Medical History:   HYPERLIPIDEMIA (ICD-272.4)   DM (ICD-250.00)   COPD (ICD-496)   - HFA 90% 07/06/2011   - PFT's August 13, 2010 FEV1 .93 (34%) ratio 37 with 23 % better p B2 DLC0 69%   - RUL Nodule first detected 07/06/2011      Social History:   Smoking Status: quit   Packs/Day: 2.0   Alcohol drinks/day: 0      Family History ? Lung Ca Brother   ? Brain Ca Sister       Objective:     Physical Exam   Amb pleasant but increasingly frail elderly hoarse wm nad   wt 189 July 16, 2010 >    194 09/24/2011 >> 08/10/2012  182> 174 11/16/2012 > 07/03/2013 174 > 07/17/2013  181 >  08/16/2013  179  HEENT mild turbinate edema. Oropharynx edentuous with dentures in place but no thrush or excess pnd or cobblestoning. No JVD or cervical adenopathy. Mild accessory muscle hypertrophy. Trachea midline, nl thryroid. Chest was hyperinflated by percussion with diminished breath sounds and moderate increased exp time without wheeze. Hoover sign positive at mid inspiration. Regular rate and rhythm without murmur gallop or rub or increase P2 or edema. Abd: no hsm, nl excursion. Ext warm without cyanosis or clubbing            ct chest 08/16/13 1. Aggressive appearing right upper lobe pulmonary mass and nodule   with a right hilar lymphadenopathy, as detailed above. This is   favored to represent a primary bronchogenic neoplasm with nodal   metastasis to the right hilum and satellite nodule in the same lobe   (i.e., T3, N1, Mx disease), however, this could alternatively   represent synchronous right upper lobe neoplasms, or less likely   an aggressive atypical infectious process such as a fungal   pneumonia (not strongly favored). Clinical correlation is strongly   recommended, with consideration for PET CT and/or biopsy at this   time.   2. In addition,  there is a nonspecific 1.7 x 1.4 cm ground-glass   attenuation nodule in the left lower lobe which warrants attention   on follow-up studies            Assessment:        COPD GOLD III/IV -          -  PFT's August 13, 2010 FEV1 .93 (34%) ratio 37 with 23 % better p B2 DLC0 69%   - PFT's 03/27/2012  FEV1  0.84 (31%) ratio 36 and 28% better p B2,  DLC0 67% - 08/10/2012  Walked RA x 3 laps @ 185 ft each stopped due to end of study, no desat - referred to rehab 08/10/2012  > declined - hfa 75% 07/03/2013 > added tudorza ? Response > try off 07/17/2013    Adequate control on present rx, reviewed > no change in rx needed       2) RUL mass  - See cxr 07/06/2011 >  No change 09/24/11 > no change 03/27/2012 > ? slt progression 11/16/2012 > def progression 07/03/2013  Discussed in detail all the  indications, usual  risks and alternatives  relative to the benefits with patient who agrees to proceed with bronchoscopy with biopsy.    08/21/2013 FOB : no change in hx or exam    Sandrea Hughs, MD Pulmonary and Critical Care Medicine [facility] Cell [phone] After 5:30 PM or weekends, call [phone]

## 2013-08-21 NOTE — Op Note (Signed)
Bronchoscopy Procedure Note  Date of Operation: 08/21/2013   Pre-op Diagnosis: lung mass  Post-op Diagnosis: same  Surgeon: Sandrea Hughs  Anesthesia: Monitored Local Anesthesia with Sedation  Operation: Video Flexible fiberoptic bronchoscopy, diagnostic   Findings: marked swelling BI lateral wall  Specimen: wang bx BI lateral wall, washings, TBBx RUL  Estimated Blood Loss: min   Complications: none  Indications and History: See updated H and P same date. The risks, benefits, complications, treatment options and expected outcomes were discussed with the patient.  The possibilities of reaction to medication, pulmonary aspiration, perforation of a viscus, bleeding, failure to diagnose a condition and creating a complication requiring transfusion or operation were discussed with the patient who freely signed the consent.    Description of Procedure: The patient was re-examined in the bronchoscopy suite and the site of surgery properly noted/marked.  The patient was identified  and the procedure verified as Flexible Fiberoptic Bronchoscopy.  A Time Out was held and the above information confirmed.   After the induction of topical nasopharyngeal anesthesia, the patient was positioned  and the bronchoscope was passed through the R naris. The vocal cords were visualized and  1% buffered lidocaine 5 ml was topically placed onto the cords. The cords were nl. The scope was then passed into the trachea.  1% buffered lidocaine given topically. Airways inspected bilaterally to the subsegmental level with the following findings:  1) Airways with copious thick rope like plugs of mucoid secretions 2) All airways patent to subsegmental level 3) only airway abnormality was lumpy mass at prox Bronchus intermedius submucosal pattern swelling  Intervertions  1) Bronchial washing RUL 2) Wang bx x 4 Bronchus Intermedius 3) TBBX RUL x one but not able to directly access the lesion on fluoro    The  Patient was taken to the Endoscopy Recovery area in satisfactory condition.  Attestation: I performed the procedure.    Sandrea Hughs, MD Pulmonary and Critical Care Medicine [facility] Cell [phone] After 5:30 PM or weekends, call [phone]

## 2013-08-21 NOTE — Progress Notes (Signed)
Video Bronchoscopy done  Intervention bronchial washing Intervention bronchial biopsy Intervention bronchial  Needle aspiration   Procedure tolerated well

## 2013-08-21 NOTE — Assessment & Plan Note (Signed)
-   See cxr 07/06/2011 >  No change 09/24/11 > no change 03/27/2012 > ? slt progression 11/16/2012 > def progression 07/03/2013 - CT chest 08/16/13 1. Aggressive appearing right upper lobe pulmonary mass and nodule  with a right hilar lymphadenopathy, as detailed above. This is  favored to represent a primary bronchogenic neoplasm with nodal  metastasis to the right hilum and satellite nodule in the same lobe  (i.e., T3, N1, Mx disease), however, this could alternatively  represent synchronous right upper lobe neoplasms, or less likely  an aggressive atypical infectious process such as a fungal  pneumonia (not strongly favored). Clinical correlation is strongly  recommended, with consideration for PET CT and/or biopsy at this  time.  2. In addition, there is a nonspecific 1.7 x 1.4 cm ground-glass  attenuation nodule in the left lower lobe which warrants attention  on follow-up studies   Discussed in detail all the  indications, usual  risks and alternatives  relative to the benefits with patient who agrees to proceed with bronchoscopy with biopsy and consideration for RT though clearly no candidate for resection I do feel he could tol fob ok with reasonable yield vs risk

## 2013-08-22 ENCOUNTER — Encounter (HOSPITAL_COMMUNITY): Payer: Self-pay | Admitting: Internal Medicine

## 2013-08-23 ENCOUNTER — Telehealth: Payer: Self-pay | Admitting: Internal Medicine

## 2013-08-23 NOTE — Telephone Encounter (Signed)
Discussed with pt, f/u in one month

## 2013-08-30 ENCOUNTER — Ambulatory Visit (INDEPENDENT_AMBULATORY_CARE_PROVIDER_SITE_OTHER): Payer: Medicare Other | Admitting: Internal Medicine

## 2013-08-30 ENCOUNTER — Encounter: Payer: Self-pay | Admitting: Internal Medicine

## 2013-08-30 ENCOUNTER — Telehealth: Payer: Self-pay | Admitting: Internal Medicine

## 2013-08-30 VITALS — BP 148/86 | HR 110 | Temp 97.6°F | Ht 68.0 in | Wt 176.4 lb

## 2013-08-30 DIAGNOSIS — R911 Solitary pulmonary nodule: Secondary | ICD-10-CM

## 2013-08-30 DIAGNOSIS — J449 Chronic obstructive pulmonary disease, unspecified: Secondary | ICD-10-CM

## 2013-08-30 MED ORDER — ACLIDINIUM BROMIDE 400 MCG/ACT IN AEPB: 1.0000 | INHALATION_SPRAY | Freq: Two times a day (BID) | RESPIRATORY_TRACT | Status: DC

## 2013-08-30 MED ORDER — PREDNISONE (PAK) 10 MG PO TABS: ORAL_TABLET | ORAL | Status: DC

## 2013-08-30 NOTE — Telephone Encounter (Signed)
I spoke with the pt and he states since his bronch he has been having increased chest congestion, SOB, waking him up at night, and increase use of albuterol inhaler. Pt st for an appt today at 11:30am.Jennifer Yancey Flemings, CMA

## 2013-08-30 NOTE — Patient Instructions (Addendum)
Prednisone 10 mg 2 daily until better then 1 daily   Please schedule a follow up office visit in 4 weeks, sooner if needed with cxr

## 2013-08-30 NOTE — Progress Notes (Signed)
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 minimal mucoid sputum in am, no noct exac but still using lots of ventolin and concerned he's using too much.  PFT's with 23% better p B2  rec Continue Dulera but increase the strength to the 200 and use it 2 puffs first thing in am and 2 puffs again in pm about 12 hours later    08/10/2012 f/u ov/Ivi Griffith cc no change doe x one  Block, uses saba avg once a day when going across a parking lot. rec Refer to rehab> declined   11/16/2012 f/u ov/Elaijah Munoz cc sob, did not want to do rehab, min use of saba off spiriva for three weeks and no change in activity tolerance which remains acceptable though he is very sedentary. rec No change rx F/u cxr in 3 mo> Attempted to call pt x 3 to make next ov per recall.  Pt never returned calls.  Mailed recall letter 02/21/13.   07/03/2013 f/u ov/Camesha Farooq re copd GOLD III Chief Complaint  Patient presents with  . Follow-up    Pt states breathing worse for the past 3 months. He states gets out of breath with minimal exertion such as walking 1/2 block on flat surface.   still taking symbicort 160 2bid  stl bloody mucus in am's first noted x 2 weeks waxes  rec Continue symbiocrt 160 Take 2 puffs first thing in am and then another 2 puffs about 12 hours later.  Add tudorza one puff twice daily after symbicort Prednisone 10 mg take  4 each am x 2 days,   2 each am x 2 days,  1 each am x 2 days and stop    07/17/2013 f/u ov/Xaria Judon re copd/ abn cxr  Chief Complaint   Patient presents with  . COPD    f/u. Pt states he feels some inprovement in SOB since taking the tudorza. Pt states he still has some chest congestion and SOB with activity.    not really clear whether tudorza helped vs effects of short course of prednisone as worse off prednisone back near baseline in terms of sob. No more hemoptysis but continues to cough up slt discolored sputum up to a couple of tbsp per day esp in am rec Stop tudorza  Augmentin 875 twice daily x 10 days Prednisone 10 mg take  4 each am x 2 days,   2 each am x 2 days,  1 each am x 2 days and stop   08/16/2013 f/u ov/Elika Godar re copd/ lung mass Chief Complaint  Patient presents with  . Follow-up    Pt had ct chest this am. Reports breathing is the same and denies any new co's today.   rec Fob   08/30/2013 f/u ov/Louan Base re: f/u lung nodules/ ab flare Chief Complaint  Patient presents with  . Acute Visit    Pt c/o increased SOB since last dose of pred on 08/22/13-using rescue inhaler approx 3 times daily. He also  c/o increased cough x several days- prod with minimal to moderate light yellow sputum.    No obvious daytime variabilty or assoc cough/ hemoptysis/   cp or chest tightness, subjective wheeze overt sinus or hb symptoms. No unusual exp hx      Sleeping ok without nocturnal  or early am exacerbation  of respiratory  c/o's or need for noct saba. Also denies any obvious fluctuation of symptoms with weather or environmental changes or other aggravating or alleviating factors except as outlined above    Current Medications, Allergies, Complete Past Medical History, Past Surgical History, Family History, and Social History were reviewed in [employer] record.  ROS  The following are not active complaints unless bolded sore throat, dysphagia, dental problems, itching, sneezing,  nasal congestion or excess/ purulent secretions, ear ache,   fever, chills, sweats, unintended wt loss, pleuritic or  exertional cp, hemoptysis,  orthopnea pnd or leg swelling, presyncope, palpitations, heartburn, abdominal pain, anorexia, nausea, vomiting, diarrhea  or change in bowel or urinary habits, change in stools or urine, dysuria,hematuria,  rash, arthralgias, visual complaints, headache, numbness weakness or ataxia or problems with walking or coordination,  change in mood/affect or memory.           Past Medical History:  HYPERLIPIDEMIA (ICD-272.4)  DM (ICD-250.00)  COPD (ICD-496)  - HFA 90% 07/06/2011  - PFT's August 13, 2010 FEV1 .93 (34%) ratio 37 with 23 % better p B2 DLC0 69%  - RUL Nodule first detected 07/06/2011    Social History:  Smoking Status: quit  Packs/Day: 2.0  Alcohol drinks/day: 0    Family History ? Lung Ca Brother  ? Brain Ca Sister        Objective:   Physical Exam  Amb pleasant but increasingly frail elderly hoarse wm nad  wt 189 July 16, 2010 >    194 09/24/2011 >> 08/10/2012  182> 174 11/16/2012 > 07/03/2013 174 > 07/17/2013  181 >  08/16/2013  179 > 08/30/2013  176  HEENT mild turbinate edema. Oropharynx edentuous with dentures in place but no thrush or excess pnd or cobblestoning. No JVD or cervical adenopathy. Mild accessory muscle hypertrophy. Trachea midline, nl thryroid. Chest was hyperinflated by percussion with diminished breath sounds and moderate increased exp time with insp / exp rhonchi. Hoover sign positive at mid inspiration. Regular rate and rhythm without murmur gallop or rub or increase P2 or edema. Abd: no hsm, nl excursion. Ext warm without cyanosis or clubbing        ct chest 08/16/13 1. Aggressive appearing right upper lobe pulmonary mass and nodule  with a right hilar lymphadenopathy, as detailed above. This is  favored to represent a primary bronchogenic neoplasm with nodal  metastasis to the right hilum and satellite nodule in the same lobe  (i.e., T3, N1, Mx disease), however, this could alternatively  represent synchronous right upper  lobe neoplasms, or less likely  an aggressive atypical infectious process such as a fungal  pneumonia (not strongly favored). Clinical correlation is strongly  recommended, with consideration for PET CT and/or biopsy at this  time.  2. In addition, there is a nonspecific 1.7 x 1.4 cm ground-glass  attenuation nodule in the left lower lobe which warrants attention  on follow-up studies       Assessment:

## 2013-09-01 NOTE — Assessment & Plan Note (Signed)
-   See cxr 07/06/2011 >  No change 09/24/11 > no change 03/27/2012 > ? slt progression 11/16/2012 > def progression 07/03/2013 - CT chest 08/16/13 1. Aggressive appearing right upper lobe pulmonary mass and nodule  with a right hilar lymphadenopathy, as detailed above. This is  favored to represent a primary bronchogenic neoplasm with nodal  metastasis to the right hilum and satellite nodule in the same lobe  (i.e., T3, N1, Mx disease), however, this could alternatively  represent synchronous right upper lobe neoplasms, or less likely  an aggressive atypical infectious process such as a fungal  pneumonia (not strongly favored). Clinical correlation is strongly  recommended, with consideration for PET CT and/or biopsy at this  time.  2. In addition, there is a nonspecific 1.7 x 1.4 cm ground-glass  attenuation nodule in the left lower lobe which warrants attention  on follow-up studies - fob 08/21/13 > extrinsic compression of BI/ wang and tbbx both neg   Discussed in detail all the  indications, usual  risks and alternatives  relative to the benefits with patient and daughter Brent Wade who agree  to proceed with  Conservative f/u considering his clinical decline/ marginal airway status

## 2013-09-01 NOTE — Assessment & Plan Note (Signed)
-   PFT's August 13, 2010 FEV1 .93 (34%) ratio 37 with 23 % better p B2 DLC0 69%  - PFT's 03/27/2012  FEV1  0.84 (31%) ratio 36 and 28% better p B2,  DLC0 67% - 08/10/2012  Walked RA x 3 laps @ 185 ft each stopped due to end of study, no desat - referred to rehab 08/10/2012  > declined - hfa 75% 07/03/2013 > added tudorza ? Response > try off 07/17/2013 > worse so restarted  Mild flare despite max topical rx > add pred maint rx     Each maintenance medication was reviewed in detail including most importantly the difference between maintenance and as needed and under what circumstances the prns are to be used.  Please see instructions for details which were reviewed in writing and the patient given a copy.

## 2013-09-27 ENCOUNTER — Ambulatory Visit (INDEPENDENT_AMBULATORY_CARE_PROVIDER_SITE_OTHER)
Admission: RE | Admit: 2013-09-27 | Discharge: 2013-09-27 | Disposition: A | Discharge status: Home / Self Care | Payer: Medicare Other | Source: Ambulatory Visit | Attending: Internal Medicine | Admitting: Internal Medicine

## 2013-09-27 ENCOUNTER — Encounter: Payer: Self-pay | Admitting: Internal Medicine

## 2013-09-27 ENCOUNTER — Ambulatory Visit (INDEPENDENT_AMBULATORY_CARE_PROVIDER_SITE_OTHER): Payer: Medicare Other | Admitting: Internal Medicine

## 2013-09-27 VITALS — BP 110/70 | HR 85 | Temp 97.5°F | Ht 68.0 in | Wt 174.0 lb

## 2013-09-27 DIAGNOSIS — R911 Solitary pulmonary nodule: Secondary | ICD-10-CM

## 2013-09-27 DIAGNOSIS — J449 Chronic obstructive pulmonary disease, unspecified: Secondary | ICD-10-CM

## 2013-09-27 NOTE — Patient Instructions (Addendum)
Please remember to go to the x-ray department downstairs for your tests - we will call you with the results when they are available.  I will ask Dr Delton Coombes to review your case to see if he would consider repeating your bronchoscopy

## 2013-09-27 NOTE — Progress Notes (Signed)
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 minimal mucoid sputum in am, no noct exac but still using lots of ventolin and concerned he's using too much.  PFT's with 23% better p B2  rec Continue Dulera but increase the strength to the 200 and use it 2 puffs first thing in am and 2 puffs again in pm about 12 hours later    08/10/2012 f/u ov/Deanna Wiater cc no change doe x one  Block, uses saba avg once a day when going across a parking lot. rec Refer to rehab> declined   11/16/2012 f/u ov/Myron Lona cc sob, did not want to do rehab, min use of saba off spiriva for three weeks and no change in activity tolerance which remains acceptable though he is very sedentary. rec No change rx F/u cxr in 3 mo> Attempted to call pt x 3 to make next ov per recall.  Pt never returned calls.  Mailed recall letter 02/21/13.   07/03/2013 f/u ov/Lilit Cinelli re copd GOLD III Chief Complaint  Patient presents with  . Follow-up    Pt states breathing worse for the past 3 months. He states gets out of breath with minimal exertion such as walking 1/2 block on flat surface.   still taking symbicort 160 2bid  stl bloody mucus in am's first noted x 2 weeks waxes  rec Continue symbiocrt 160 Take 2 puffs first thing in am and then another 2 puffs about 12 hours later.  Add tudorza one puff twice daily after symbicort Prednisone 10 mg take  4 each am x 2 days,   2 each am x 2 days,  1 each am x 2 days and stop    07/17/2013 f/u ov/Jadelynn Boylan re copd/ abn cxr  Chief Complaint   Patient presents with  . COPD    f/u. Pt states he feels some inprovement in SOB since taking the tudorza. Pt states he still has some chest congestion and SOB with activity.    not really clear whether tudorza helped vs effects of short course of prednisone as worse off prednisone back near baseline in terms of sob. No more hemoptysis but continues to cough up slt discolored sputum up to a couple of tbsp per day esp in am rec Stop tudorza  Augmentin 875 twice daily x 10 days Prednisone 10 mg take  4 each am x 2 days,   2 each am x 2 days,  1 each am x 2 days and stop   08/16/2013 f/u ov/Skyla Champagne re copd/ lung mass Chief Complaint  Patient presents with  . Follow-up    Pt had ct chest this am. Reports breathing is the same and denies any new co's today.   rec Fob 08/21/13 > extrinsic compression Bronchus intermedius but neg cyt on wang      08/30/2013 f/u ov/Keiyon Plack re: f/u lung nodules/ ab flare Chief Complaint  Patient presents with  . Acute Visit    Pt c/o increased SOB since  last dose of pred on 08/22/13-using rescue inhaler approx 3 times daily. He also c/o increased cough x several days- prod with minimal to moderate light yellow sputum.  rec Prednisone 10 mg 2 daily until better then 1 daily   09/27/2013 f/u ov/Niurka Benecke re: sob/ lung nodule Chief Complaint  Patient presents with  . Follow-up    Pt states that his breathing has not improved since last visit. Cough is some better. Using rescue inhaler twice per day.   cough/ congestion better on 20 of pred worse on 10  X 5d so back up to 20 mg daily Doe x grocery shopping ok no [employer], no better on pred Variable traces of hemoptysis  No obvious pattern to day to day or daytime variabilty or  cp or chest tightness, subjective wheeze overt sinus or hb symptoms. No unusual exp hx      Sleeping ok without nocturnal  or early am exacerbation  of respiratory  c/o's or need for noct saba. Also denies any obvious fluctuation of symptoms with  weather or environmental changes or other aggravating or alleviating factors except as outlined above    Current Medications, Allergies, Complete Past Medical History, Past Surgical History, Family History, and Social History were reviewed in [employer] record.  ROS  The following are not active complaints unless bolded sore throat, dysphagia, dental problems, itching, sneezing,  nasal congestion or excess/ purulent secretions, ear ache,   fever, chills, sweats, unintended wt loss, pleuritic or exertional cp, hemoptysis,  orthopnea pnd or leg swelling, presyncope, palpitations, heartburn, abdominal pain, anorexia, nausea, vomiting, diarrhea  or change in bowel or urinary habits, change in stools or urine, dysuria,hematuria,  rash, arthralgias, visual complaints, headache, numbness weakness or ataxia or problems with walking or coordination,  change in mood/affect or memory.           Past Medical History:  HYPERLIPIDEMIA (ICD-272.4)  DM (ICD-250.00)  COPD (ICD-496)  - HFA 90% 07/06/2011  - PFT's August 13, 2010 FEV1 .93 (34%) ratio 37 with 23 % better p B2 DLC0 69%  - RUL Nodule first detected 07/06/2011    Social History:  Smoking Status: quit  Packs/Day: 2.0  Alcohol drinks/day: 0    Family History ? Lung Ca Brother  ? Brain Ca Sister        Objective:   Physical Exam  Amb pleasant but increasingly frail elderly hoarse wm nad  wt 189 July 16, 2010 >    194 09/24/2011 >> 08/10/2012  182> 174 11/16/2012 > 07/03/2013 174 > 07/17/2013  181 >  08/16/2013  179 > 08/30/2013  176 > 09/27/2013   147  HEENT mild turbinate edema. Oropharynx edentuous with dentures in place but no thrush or excess pnd or cobblestoning. No JVD or cervical adenopathy. Mild accessory muscle hypertrophy. Trachea midline, nl thryroid. Chest was hyperinflated by percussion with diminished breath sounds and moderate increased exp time with prominent large airway sounding bilateral insp / exp  rhonchi. Hoover sign positive at mid inspiration. Regular rate and rhythm without murmur gallop or rub or increase P2 or edema. Abd: no hsm, nl excursion. Ext warm without cyanosis or clubbing        ct chest 08/16/13 1. Aggressive appearing right upper lobe pulmonary mass and nodule  with a right hilar lymphadenopathy, as detailed above. This is  favored to represent a primary bronchogenic neoplasm with nodal  metastasis to the right hilum and satellite nodule in the same lobe  (i.e., T3,  N1, Mx disease), however, this could alternatively  represent synchronous right upper lobe neoplasms, or less likely  an aggressive atypical infectious process such as a fungal  pneumonia (not strongly favored). Clinical correlation is strongly  recommended, with consideration for PET CT and/or biopsy at this  time.  2. In addition, there is a nonspecific 1.7 x 1.4 cm ground-glass  attenuation nodule in the left lower lobe which warrants attention  on follow-up studies   CXR  09/27/2013 :   Grossly similar appearance since 07/03/2013 of right upper lobe lung  nodule and adjacent adenopathy. No evidence of acute superimposed  process.    Assessment:

## 2013-09-27 NOTE — Assessment & Plan Note (Signed)
-   PFT's August 13, 2010 FEV1 .93 (34%) ratio 37 with 23 % better p B2 DLC0 69%  - PFT's 03/27/2012  FEV1  0.84 (31%) ratio 36 and 28% better p B2,  DLC0 67% - 08/10/2012  Walked RA x 3 laps @ 185 ft each stopped due to end of study, no desat - referred to rehab 08/10/2012  > declined - hfa 75% 07/03/2013 > added tudorza ? Response > try off 07/17/2013 > worse so restarted - Daily pred rx started 09/01/13   The goal with a chronic steroid dependent illness is always arriving at the lowest effective dose that controls the disease/symptoms and not accepting a set "formula" which is based on statistics or guidelines that don't always take into account patient  variability or the natural hx of the dz in every individual patient, which may well vary over time.  For now therefore I recommend the patient maintain  Ceiling of 20 and floor of 10 mg daily

## 2013-09-27 NOTE — Assessment & Plan Note (Signed)
-   See cxr 07/06/2011 >  No change 09/24/11 > no change 03/27/2012 > ? slt progression 11/16/2012 > def progression 07/03/2013 - CT chest 08/16/13 1. Aggressive appearing right upper lobe pulmonary mass and nodule  with a right hilar lymphadenopathy, as detailed above. This is  favored to represent a primary bronchogenic neoplasm with nodal  metastasis to the right hilum and satellite nodule in the same lobe  (i.e., T3, N1, Mx disease), however, this could alternatively  represent synchronous right upper lobe neoplasms, or less likely  an aggressive atypical infectious process such as a fungal  pneumonia (not strongly favored). Clinical correlation is strongly  recommended, with consideration for PET CT and/or biopsy at this  time.  2. In addition, there is a nonspecific 1.7 x 1.4 cm ground-glass  attenuation nodule in the left lower lobe which warrants attention  on follow-up studies - fob 08/21/13 > extrinsic compression of BI/ wang and tbbx > neg cytology    Discussed in detail all the  indications, usual  risks and alternatives  relative to the benefits with patient and daughter  who agree to proceed with bronchoscopy with biopsy per Dr Delton Coombes (will review with him)

## 2013-10-08 ENCOUNTER — Telehealth: Payer: Self-pay | Admitting: Emergency Medicine

## 2013-10-08 NOTE — Telephone Encounter (Signed)
Called and spoke with pt.  Scheduled pt with RB for 10/17/13 @ 9:30.  Pt aware and will be here

## 2013-10-08 NOTE — Telephone Encounter (Signed)
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             Thx!             ------

## 2013-10-08 NOTE — Telephone Encounter (Signed)
I need to set up this patient for an OV soon to discuss a repeat procedure. Thanks.

## 2013-10-13 DIAGNOSIS — I480 Paroxysmal atrial fibrillation: Secondary | ICD-10-CM

## 2013-10-13 HISTORY — DX: Paroxysmal atrial fibrillation: I48.0

## 2013-10-17 ENCOUNTER — Ambulatory Visit (INDEPENDENT_AMBULATORY_CARE_PROVIDER_SITE_OTHER): Payer: Self-pay | Admitting: Emergency Medicine

## 2013-10-17 ENCOUNTER — Encounter: Payer: Self-pay | Admitting: Emergency Medicine

## 2013-10-17 VITALS — BP 130/80 | HR 83 | Ht 68.0 in | Wt 177.6 lb

## 2013-10-17 DIAGNOSIS — E785 Hyperlipidemia, unspecified: Secondary | ICD-10-CM

## 2013-10-17 DIAGNOSIS — R911 Solitary pulmonary nodule: Secondary | ICD-10-CM

## 2013-10-17 DIAGNOSIS — J449 Chronic obstructive pulmonary disease, unspecified: Secondary | ICD-10-CM

## 2013-10-17 NOTE — Progress Notes (Signed)
HPI:  72 year old man, former smoker (60 pack years), history of diabetes, hyperlipidemia and COPD ( FEV1 0.93L). He has been followed by Dr. Sherene Sires for a lung mass with right hilar lymphadenopathy. Bronchoscopy revealed extrinsic compression of the bronchus intermedius. Needle biopsies were performed on 08/21/13 but the cytology was negative. He is referred now for consideration of repeat bronchoscopy and possibly EBUS. He is on Tudorza bid, Symbicort bit, SABA once a day.    Past Medical History  Diagnosis Date  . Hyperlipidemia   . Type II or unspecified type diabetes mellitus without mention of complication, not stated as uncontrolled   . Chronic airway obstruction, not elsewhere classified     HFA 25-50% 07-16-2010> 100% 01-28-2011; PFT's 08-13-10 FEV1 .93(34%) ratio 37 with 23% better p B2 DLCO 69%     No family history on file.   History   Social History  . Marital Status: Married    Spouse Name: N/A    Number of Children: N/A  . Years of Education: N/A   Occupational History  . Not on file.   Social History Main Topics  . Smoking status: Former Smoker -- 1.50 packs/day for 40 years    Types: Cigarettes    Quit date: 12/13/2002  . Smokeless tobacco: Current User    Types: Chew  . Alcohol Use: No  . Drug Use: No  . Sexual Activity: Not on file   Other Topics Concern  . Not on file   Social History Narrative  . No narrative on file     No Known Allergies   Outpatient Prescriptions Prior to Visit  Medication Sig Dispense Refill  . Aclidinium Bromide (TUDORZA PRESSAIR) 400 MCG/ACT AEPB Inhale 1 puff into the lungs 2 (two) times daily. One twice daily  1 each  11  . albuterol (PROAIR HFA) 108 (90 BASE) MCG/ACT inhaler Inhale 2 puffs into the lungs every 6 (six) hours as needed.  1 Inhaler  0  . budesonide-formoterol (SYMBICORT) 160-4.5 MCG/ACT inhaler Inhale 2 puffs into the lungs 2 (two) times daily.        Marland Kitchen doxazosin (CARDURA) 2 MG tablet Take 2 mg by mouth daily.         Marland Kitchen guaiFENesin (MUCINEX) 600 MG 12 hr tablet Take 1,200 mg by mouth 2 (two) times daily as needed.       . metFORMIN (GLUCOPHAGE) 500 MG tablet Take 500 mg by mouth 2 (two) times daily with a meal.       . simvastatin (ZOCOR) 40 MG tablet Take 40 mg by mouth at bedtime.        . predniSONE (STERAPRED UNI-PAK) 10 MG tablet Prednisone 10 mg take  2 daily with breakfast until better then 1 daily  100 tablet  0   No facility-administered medications prior to visit.    Filed Vitals:   10/17/13 0950  BP: 130/80  Pulse: 83  Height: 5\' 8"  (1.727 m)  Weight: 177 lb 9.6 oz (80.559 kg)  SpO2: 98%   Gen: Pleasant, well-nourished, in no distress,  normal affect  ENT: No lesions,  mouth clear,  oropharynx clear, no postnasal drip  Neck: No JVD, no TMG, no carotid bruits  Lungs: No use of accessory muscles, clear without rales or rhonchi  Cardiovascular: irreg irreg, heart sounds normal, no murmur or gallops, no peripheral edema  Musculoskeletal: No deformities, no cyanosis or clubbing  Neuro: alert, non focal  Skin: Warm, no lesions or rashes  ECG today >> A Fib  with rate 75, no ischemic changes.   08/16/13 --  Comparison: Chest x-ray 06/13/2013.  Findings:  Mediastinum: Heart size is normal. There is no significant  pericardial fluid, thickening or pericardial calcification. There  is atherosclerosis of the thoracic aorta, the great vessels of the  mediastinum and the coronary arteries, including calcified  atherosclerotic plaque in the left main, left anterior descending,  left circumflex and right coronary arteries. Enlarged right hilar  lymph node measuring 1.6 x 2.2 cm (image 26 of series 2). No  definite pathologically enlarged mediastinal or left hilar lymph  nodes are noted. Esophagus is unremarkable in appearance. Mild  calcifications of the aortic valve.  Lungs/Pleura: In the right upper lobe there is a 4.2 x 2.9 cm  macrolobulated mass with spiculated margins (image 20 of  series 3).  In addition, more posteriorly in the right upper lobe there is a  1.5 x 1.4 cm macrolobulated nodule with some spiculated margins. A  1.7 x 1.4 cm nodular area of ground-glass attenuation in the  posterior aspect of the left lower lobe (image 26 of series three)  is also noted. There is a background of mild to moderate central  lobular and paraseptal emphysema.  Upper Abdomen: Surgical clips near the celiac axis. Mild  adreniform thickening of the right adrenal gland which is  incompletely visualized.  Musculoskeletal: A small fatty attenuation lesion in the right  serratus anterior musculature, likely represent an intramuscular  lipoma. There are no aggressive appearing lytic or blastic lesions  noted in the visualized portions of the skeleton.  IMPRESSION:  1. Aggressive appearing right upper lobe pulmonary mass and nodule  with a right hilar lymphadenopathy, as detailed above. This is  favored to represent a primary bronchogenic neoplasm with nodal  metastasis to the right hilum and satellite nodule in the same lobe  (i.e., T3, N1, Mx disease), however, this could alternatively  represent synchronous right upper lobe neoplasms, or less likely  an aggressive atypical infectious process such as a fungal  pneumonia (not strongly favored). Clinical correlation is strongly  recommended, with consideration for PET CT and/or biopsy at this  time.  2. In addition, there is a nonspecific 1.7 x 1.4 cm ground-glass  attenuation nodule in the left lower lobe which warrants attention  on follow-up studies. Per guidelines the initial follow-up by  chest CT without contrast is recommended in 3 months to confirm  persistence, however, this lesion could likely be followed on any  routine follow-up scans performed for the findings in number 1  above.  3. Atherosclerosis, including left main and three-vessel coronary  artery disease. Assessment for potential risk factor modification,   dietary therapy or pharmacologic therapy may be warranted, if  clinically indicated.  4. There are calcifications of the aortic valve. Echocardiographic  correlation for evaluation of potential valvular dysfunction may be  warranted if clinically indicated.  5. Additional incidental findings, as above.   Pulmonary nodule I believe he needs repeat biopsy by bronchoscopy. This should likely be done under general anesthesia to facilitate ultrasound and also navigation. He will need cardiology risk stratification before we perform the procedure.   COPD GOLD III/IV Continue same inhaled medications

## 2013-10-17 NOTE — Assessment & Plan Note (Signed)
Continue same inhaled medications

## 2013-10-17 NOTE — Patient Instructions (Addendum)
ECG today shows an irregular heartbeat.  We will refer you to cardiology for evaluation before performing your bronchoscopy.  We will perform a repeat CT scan of your chest We will schedule bronchoscopy at [facility] under general anesthesia Continue your inhaled medications as you are taking them Follow with Dr Delton Coombes in 1 month

## 2013-10-17 NOTE — Assessment & Plan Note (Signed)
I believe he needs repeat biopsy by bronchoscopy. This should likely be done under general anesthesia to facilitate ultrasound and also navigation. He will need cardiology risk stratification before we perform the procedure.

## 2013-10-23 ENCOUNTER — Ambulatory Visit (INDEPENDENT_AMBULATORY_CARE_PROVIDER_SITE_OTHER)
Admission: RE | Admit: 2013-10-23 | Discharge: 2013-10-23 | Disposition: A | Discharge status: Home / Self Care | Payer: Medicare Other | Source: Ambulatory Visit | Attending: Emergency Medicine | Admitting: Emergency Medicine

## 2013-10-23 DIAGNOSIS — R911 Solitary pulmonary nodule: Secondary | ICD-10-CM

## 2013-10-23 DIAGNOSIS — E785 Hyperlipidemia, unspecified: Secondary | ICD-10-CM

## 2013-10-26 ENCOUNTER — Encounter (HOSPITAL_COMMUNITY)
Admission: RE | Admit: 2013-10-26 | Discharge: 2013-10-26 | Disposition: A | Discharge status: Home / Self Care | Payer: Medicare Other | Source: Ambulatory Visit | Attending: Emergency Medicine | Admitting: Emergency Medicine

## 2013-10-26 ENCOUNTER — Encounter (HOSPITAL_COMMUNITY): Payer: Self-pay

## 2013-10-26 ENCOUNTER — Encounter (HOSPITAL_COMMUNITY): Payer: Self-pay | Admitting: Pharmacy Technician

## 2013-10-26 HISTORY — DX: Essential (primary) hypertension: I10

## 2013-10-26 HISTORY — DX: Unspecified osteoarthritis, unspecified site: M19.90

## 2013-10-26 HISTORY — DX: Shortness of breath: R06.02

## 2013-10-26 LAB — COMPREHENSIVE METABOLIC PANEL
ALT: 10 U/L (ref 0–53)
AST: 11 U/L (ref 0–37)
Albumin: 3.8 g/dL (ref 3.5–5.2)
Alkaline Phosphatase: 71 U/L (ref 39–117)
BUN: 8 mg/dL (ref 6–23)
Chloride: 100 mEq/L (ref 96–112)
Potassium: 4.3 mEq/L (ref 3.5–5.1)
Total Bilirubin: 0.5 mg/dL (ref 0.3–1.2)

## 2013-10-26 LAB — CBC
HCT: 39.3 % (ref 39.0–52.0)
RDW: 13.1 % (ref 11.5–15.5)
WBC: 6.7 10*3/uL (ref 4.0–10.5)

## 2013-10-26 LAB — PROTIME-INR: Prothrombin Time: 13.5 seconds (ref 11.6–15.2)

## 2013-10-26 LAB — APTT: aPTT: 26 seconds (ref 24–37)

## 2013-10-26 NOTE — Progress Notes (Signed)
10/26/13 0921  OBSTRUCTIVE SLEEP APNEA  Have you ever been diagnosed with sleep apnea through a sleep study? No  Do you snore loudly (loud enough to be heard through closed doors)?  0  Do you often feel tired, fatigued, or sleepy during the daytime? 1 ("been in the last 2-3 mths")  Has anyone observed you stop breathing during your sleep? 0  Do you have, or are you being treated for high blood pressure? 1  BMI more than 35 kg/m2? 0  Age over 74 years old? 1  Neck circumference greater than 40 cm/18 inches? 0  Gender: 1  Obstructive Sleep Apnea Score 4  Score 4 or greater  Results sent to PCP

## 2013-10-26 NOTE — Progress Notes (Signed)
Patient having cardiac w/u with Dr. Herbie Baltimore on Monday.  Report should be in late Monday or early Tuesday in Epic.  Please check!.   Thanks

## 2013-10-26 NOTE — Pre-Procedure Instructions (Signed)
CYLAS FALZONE  10/26/2013   Your procedure is scheduled on:  Wednesday, November 19th   Report to [facility]  2 * 3 at 6:30 AM.   Call this number if you have problems the morning of surgery: [phone]   Remember:   Do not eat food or drink liquids after midnight Tuesday.   Take only these medicines the morning of surgery: Inhalers   Do not wear jewelry.  Do not wear lotions, powders, or colognes. You may wear deodorant.    Men may shave face and neck.  Do not bring valuables to the hospital.  [facility] is not responsible for any belongings or valuables.               Contacts, dentures or bridgework may not be worn into surgery.  Leave suitcase in the car. After surgery it may be brought to your room.  For patients admitted to the hospital, discharge time is determined by your treatment team.               Patients discharged the day of surgery will not be allowed to drive home; you will need someone to stay with you for the first 24 hrs after surgery.   Name and phone number of your driver:    Special Instructions: Shower using CHG 2 nights before surgery and the night before surgery.  If you shower the day of surgery use CHG.  Use special wash - you have one bottle of CHG for all showers.  You should use approximately 1/3 of the bottle for each shower.   Please read over the following fact sheets that you were given: Pain Booklet and Surgical Site Infection Prevention

## 2013-10-29 ENCOUNTER — Ambulatory Visit (HOSPITAL_COMMUNITY)
Admission: RE | Admit: 2013-10-29 | Discharge: 2013-10-29 | Disposition: A | Discharge status: Home / Self Care | Payer: Medicare Other | Source: Ambulatory Visit | Attending: Cardiovascular Disease | Admitting: Cardiovascular Disease

## 2013-10-29 ENCOUNTER — Encounter: Payer: Self-pay | Admitting: Cardiology

## 2013-10-29 ENCOUNTER — Ambulatory Visit (INDEPENDENT_AMBULATORY_CARE_PROVIDER_SITE_OTHER): Payer: Medicare Other | Admitting: Cardiology

## 2013-10-29 VITALS — BP 132/80 | HR 111 | Ht 68.0 in | Wt 182.6 lb

## 2013-10-29 DIAGNOSIS — R0609 Other forms of dyspnea: Secondary | ICD-10-CM

## 2013-10-29 DIAGNOSIS — J4489 Other specified chronic obstructive pulmonary disease: Secondary | ICD-10-CM | POA: Insufficient documentation

## 2013-10-29 DIAGNOSIS — R0989 Other specified symptoms and signs involving the circulatory and respiratory systems: Secondary | ICD-10-CM | POA: Insufficient documentation

## 2013-10-29 DIAGNOSIS — I4891 Unspecified atrial fibrillation: Secondary | ICD-10-CM | POA: Insufficient documentation

## 2013-10-29 DIAGNOSIS — J449 Chronic obstructive pulmonary disease, unspecified: Secondary | ICD-10-CM | POA: Insufficient documentation

## 2013-10-29 DIAGNOSIS — E119 Type 2 diabetes mellitus without complications: Secondary | ICD-10-CM | POA: Insufficient documentation

## 2013-10-29 DIAGNOSIS — E785 Hyperlipidemia, unspecified: Secondary | ICD-10-CM | POA: Insufficient documentation

## 2013-10-29 DIAGNOSIS — I10 Essential (primary) hypertension: Secondary | ICD-10-CM

## 2013-10-29 DIAGNOSIS — R0600 Dyspnea, unspecified: Secondary | ICD-10-CM

## 2013-10-29 DIAGNOSIS — Z01810 Encounter for preprocedural cardiovascular examination: Secondary | ICD-10-CM

## 2013-10-29 HISTORY — PX: TRANSTHORACIC ECHOCARDIOGRAM: SHX275

## 2013-10-29 MED ORDER — SIMVASTATIN 40 MG PO TABS: 20.0000 mg | ORAL_TABLET | Freq: Every day | ORAL | Status: DC

## 2013-10-29 MED ORDER — DILTIAZEM HCL 60 MG PO TABS: 60.0000 mg | ORAL_TABLET | Freq: Two times a day (BID) | ORAL | Status: DC

## 2013-10-29 NOTE — Progress Notes (Signed)
2D Echo Performed 10/29/2013    Korry Dalgleish, RCS  

## 2013-10-29 NOTE — Patient Instructions (Addendum)
Your physician has requested that you have an echocardiogram. Echocardiography is a painless test that uses sound waves to create images of your heart. It provides your doctor with information about the size and shape of your heart and how well your heart's chambers and valves are working. This procedure takes approximately one hour. There are no restrictions for this procedure.  Your physician has requested that you have a lexiscan myoview. For further information please visit [email or web address] Please follow instruction sheet, as given. Will do after your biopsy. Appointment needed for Brent Wade -discuss anticoagulation medication   diltiazem 60 mg twice a day Change simvastatin to 20 mg a day (1/2 tablet of 40 mg)   Your physician recommends that you schedule a follow-up appointment in after test.

## 2013-10-30 NOTE — Progress Notes (Signed)
Quick Note:  Release info to MY CHART. Per Dr Herbie Baltimore, okay have bronchoscopy ______

## 2013-10-30 NOTE — Progress Notes (Signed)
PATIENT: Brent Wade MRN: [id number] DOB: 10/01/1941 PCP: Thora Lance, MD  Clinic Note: Chief Complaint  Patient presents with  . New Evaluation    clearance for bronchoscopy  Dr Delton Coombes SCHEDULE FOR 10/31/13, NO CHEST PAIN , LIITLE SWELLING IN ANKLES, NO SOB  ,COPD, NO CARIAC TESTING   HPI: Brent Wade is a 72 y.o. male with a PMH of COPD, hypertension, hyperlipidemia and diabetes with recent diagnosis of lung nodule and now atrial fibrillation who presents today for preprocedural cardiac evaluation for bronchoscopy and biopsy.  Interval History: The patient is relatively stable from a cardiac standpoint. He has long-standing COPD, and was found to have a lung nodule. He is scheduled to have bronchoscopy guided biopsy this week. During his preprocedural evaluation, he was noted to be in atrial fibrillation on ECG. He has actually no symptoms of palpitations, irregular heart beats or rapid heartbeats. He has baseline dyspnea, which has been somewhat worse over last year so. He is able to walk around the store, will get short of breath walking up steps or he denies any chest tightness or chest pressure with rest or exertion. He has mild lower shin edema, but denies any PND or orthopnea. He describes being short of breath he lies down flat on his stomach, but not on his back.  The remainder of cardiac review of systems is as follows: Cardiovascular ROS: negative for - chest pain, irregular heartbeat, loss of consciousness, murmur, orthopnea, palpitations, paroxysmal nocturnal dyspnea or rapid heart rate : Additional cardiac review of systems: Lightheadedness - no, dizziness - no, syncope/near-syncope - no; TIA/amaurosis fugax - no Melena - no, hematochezia no; hematuria - no; nosebleeds - no; claudication - no  Past Medical History  Diagnosis Date  . Hyperlipidemia   . Type II or unspecified type diabetes mellitus without mention of complication, not stated as uncontrolled   .  Chronic airway obstruction, not elsewhere classified     HFA 25-50% 07-16-2010> 100% 01-28-2011; PFT's 08-13-10 FEV1 .93(34%) ratio 37 with 23% better p B2 DLCO 69%  . Shortness of breath   . Hypertension   . Arthritis     in hands    Prior Cardiac Evaluation and Past Surgical History: Past Surgical History  Procedure Laterality Date  . Video bronchoscopy Bilateral 08/21/2013    Procedure: VIDEO BRONCHOSCOPY WITH FLUORO;  Surgeon: Nyoka Cowden, MD;  Location: [facility];  Service: Cardiopulmonary;  Laterality: Bilateral;  . 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 Cholecystectomy  1995  . Eye surgery Bilateral     cataracts    No Known Allergies  Current Outpatient Prescriptions  Medication Sig Dispense Refill  . Aclidinium Bromide (TUDORZA PRESSAIR) 400 MCG/ACT AEPB Inhale 1 puff into the lungs 2 (two) times daily.      Marland Kitchen albuterol (PROVENTIL HFA;VENTOLIN HFA) 108 (90 BASE) MCG/ACT inhaler Inhale 1 puff into the lungs every 6 (six) hours as needed for wheezing or shortness of breath.      . budesonide-formoterol (SYMBICORT) 160-4.5 MCG/ACT inhaler Inhale 2 puffs into the lungs 2 (two) times daily.        Marland Kitchen doxazosin (CARDURA) 2 MG tablet Take 2 mg by mouth daily.        Marland Kitchen guaiFENesin (MUCINEX) 600 MG 12 hr tablet Take by mouth 2 (two) times daily as needed.      . metFORMIN (GLUCOPHAGE) 500 MG tablet Take 500 mg by mouth  2 (two) times daily with a meal.       . simvastatin (ZOCOR) 40 MG tablet Take 0.5 tablets (20 mg total) by mouth at bedtime.  30 tablet  6  . diltiazem (CARDIZEM) 60 MG tablet Take 1 tablet (60 mg total) by mouth 2 (two) times daily.  60 tablet  6   No current facility-administered medications for this visit.    diltiazem added this visit  History   Social History Narrative   He has been married for 48 years to his wife Darel Hong.   They have 3 daughters, 2 still alive (Debra died at age 60 of lung cancer) -- 5 grandchildren    Former smoker: Quit 2004; 1.5 pack per day for 40 years; does not drink alcohol or use illicit drugs   Does not get routine exercise.   Family History: family history includes Diabetes in his brother, daughter, father, and sister; Heart murmur in his daughter; Hyperlipidemia in his daughter and daughter; Hypertension in his daughter and daughter; Liver disease in his sister; Lung cancer in his daughter and sister.  ROS: A comprehensive Review of Systems - Negative except Mild arthritis aches and pains Respiratory ROS: positive for - cough, wheezing and And exertional short of breath negative for - hemoptysis, orthopnea, pleuritic pain, sputum changes, stridor or tachypnea  PHYSICAL EXAM BP 132/80  Pulse 111  Ht 5\' 8"  (1.727 m)  Wt 182 lb 9.6 oz (82.827 kg)  BMI 27.77 kg/m2 General appearance: alert, cooperative, appears stated age, no distress and pleasant mood and affect. Well-nourished and well-groomed. Answers questions appropriately. HEENT: [place]/AT, EOMI, MMM, anicteric sclera; mild arcus sinilus Neck: no adenopathy, no carotid bruit, no JVD, supple, symmetrical, trachea midline and thyroid not enlarged, symmetric, no tenderness/mass/nodules Lungs: Mostly CTA B. with mild end expiratory wheezing bilaterally; nonlabored, with essentially good air movement. No rales or rhonchi. Heart: normal apical impulse and irregularly irregular rate and rhythm. Tachycardic with no obvious rubs or gallops. There is a soft 1/6 SEM at RUSB. Abdomen: soft, non-tender; bowel sounds normal; no masses,  no organomegaly Extremities: No clubbing, cyanosis with mild (1+) lower extremity edema Pulses: 2+ and symmetric Skin: Skin color, texture, turgor normal. No rashes or lesions Neurologic: Alert and oriented X 3, normal strength and tone. Normal symmetric reflexes. Normal coordination and gait  [id number] today: Yes Rate: 106 , Rhythm:  A. fib with RVR and PVC versus aberrant conducted beat.  Recent Labs:  Reviewed on Epic. Lipid panel not available  ASSESSMENT / PLAN:  Atrial fibrillation with RVR New diagnoses of major fibrillation, likely persistent/potentially chronic. Unknown duration, asymptomatic.   Diltiazem 60 mg twice a day  2-D echo today - will add addendum to note with summary  Should be stable for bronchoscopy without any further cardiac evaluation simply based on lack of symptoms.  Following bronchoscopy, however, he will need to initiate anticoagulation based upon his CHA2DS2-VASC score of 3 (age, hypertension and diabetes - without knowledge of EF)  We will set up an appointment with him to see our pharmacist, Phillips Hay to discuss potential and correlation options including warfarin, Xarelto or Eliquis  He will also need an ischemic evaluation based on his risk factors noted above: Hypertension, diabetes, hyperlipidemia, chronic smoker --  will order a LexiScan Myoview to be performed post-procedure   Preoperative cardiovascular examination - for bronchoscopy He has no active cardiac symptoms besides expected COPD related dyspnea. He has no other active cardiac symptoms, and is asymptomatic of his atrial  fibrillation.  Based on her having COPD, will start calcitonin blocker for rate control. Will begin an echocardiogram to be performed to get a better assessment of his EF. This is not a prerequisite for proceeding with the bronchoscopy procedure which is a low risk procedure.  Essential hypertension Only on Cardura. Well-controlled.  HYPERLIPIDEMIA On statin, monitored by PCP  Dyspnea on exertion He has chronic dyspnea on exertion, however with new diagnosis of atrial fibrillation, will check 2-D echocardiogram and Myoview for structural as well as ischemiccardiac evaluation.    Orders Placed This Encounter  Procedures  . Myocardial Perfusion Imaging    Afib, dsypnea    Standing Status: Future     Number of Occurrences:      Standing Expiration Date:  10/29/2014    Order Specific Question:  Where should this test be performed    Answer:  [facility]    Order Specific Question:  Type of stress    Answer:  Lexiscan    Order Specific Question:  Patient weight in lbs    Answer:  182  . EKG 12-Lead  . 2D Echocardiogram without contrast    Standing Status: Future     Number of Occurrences: 1     Standing Expiration Date: 10/29/2014    Scheduling Instructions:     NEED TODAY OR TOMORROW    Order Specific Question:  Type of Echo    Answer:  Complete    Order Specific Question:  Where should this test be performed    Answer:  [facility]    Order Specific Question:  Reason for exam-Echo    Answer:  Atrail Fibrillation  427.31    Order Specific Question:  Reason for exam-Echo    Answer:  Dyspnea  786.09   Meds ordered this encounter  Medications  . guaiFENesin (MUCINEX) 600 MG 12 hr tablet    Sig: Take by mouth 2 (two) times daily as needed.  . diltiazem (CARDIZEM) 60 MG tablet    Sig: Take 1 tablet (60 mg total) by mouth 2 (two) times daily.    Dispense:  60 tablet    Refill:  6  . simvastatin (ZOCOR) 40 MG tablet    Sig: Take 0.5 tablets (20 mg total) by mouth at bedtime.    Dispense:  30 tablet    Refill:  6    Followup: ~2 weeks with Phillips Hay, 4 weeks with me  Charly Hunton W. Herbie Baltimore, M.D., M.S. [facility] [address] [place], [place]  [address]  [phone] Pager # [phone]

## 2013-10-31 ENCOUNTER — Ambulatory Visit (HOSPITAL_COMMUNITY)
Admission: RE | Admit: 2013-10-31 | Discharge: 2013-10-31 | Disposition: A | Discharge status: Home / Self Care | Payer: Medicare Other | Source: Ambulatory Visit | Attending: Emergency Medicine | Admitting: Emergency Medicine

## 2013-10-31 ENCOUNTER — Encounter: Payer: Self-pay | Admitting: Cardiology

## 2013-10-31 ENCOUNTER — Ambulatory Visit (HOSPITAL_COMMUNITY): Payer: Medicare Other | Admitting: Certified Registered"

## 2013-10-31 ENCOUNTER — Ambulatory Visit (HOSPITAL_COMMUNITY): Payer: Medicare Other

## 2013-10-31 ENCOUNTER — Encounter (HOSPITAL_COMMUNITY)
Admission: RE | Disposition: A | Discharge status: Home / Self Care | Payer: Self-pay | Source: Ambulatory Visit | Attending: Emergency Medicine

## 2013-10-31 ENCOUNTER — Encounter (HOSPITAL_COMMUNITY): Payer: Medicare Other | Admitting: Certified Registered"

## 2013-10-31 ENCOUNTER — Encounter (HOSPITAL_COMMUNITY): Payer: Self-pay | Admitting: Surgery

## 2013-10-31 DIAGNOSIS — E119 Type 2 diabetes mellitus without complications: Secondary | ICD-10-CM | POA: Insufficient documentation

## 2013-10-31 DIAGNOSIS — R911 Solitary pulmonary nodule: Secondary | ICD-10-CM

## 2013-10-31 DIAGNOSIS — I4891 Unspecified atrial fibrillation: Secondary | ICD-10-CM | POA: Insufficient documentation

## 2013-10-31 DIAGNOSIS — IMO0002 Reserved for concepts with insufficient information to code with codable children: Secondary | ICD-10-CM | POA: Insufficient documentation

## 2013-10-31 DIAGNOSIS — R599 Enlarged lymph nodes, unspecified: Secondary | ICD-10-CM | POA: Insufficient documentation

## 2013-10-31 DIAGNOSIS — Z01810 Encounter for preprocedural cardiovascular examination: Secondary | ICD-10-CM | POA: Insufficient documentation

## 2013-10-31 DIAGNOSIS — Z87891 Personal history of nicotine dependence: Secondary | ICD-10-CM | POA: Insufficient documentation

## 2013-10-31 DIAGNOSIS — I10 Essential (primary) hypertension: Secondary | ICD-10-CM | POA: Insufficient documentation

## 2013-10-31 DIAGNOSIS — J449 Chronic obstructive pulmonary disease, unspecified: Secondary | ICD-10-CM | POA: Insufficient documentation

## 2013-10-31 DIAGNOSIS — E785 Hyperlipidemia, unspecified: Secondary | ICD-10-CM | POA: Insufficient documentation

## 2013-10-31 DIAGNOSIS — J4489 Other specified chronic obstructive pulmonary disease: Secondary | ICD-10-CM | POA: Insufficient documentation

## 2013-10-31 DIAGNOSIS — C3410 Malignant neoplasm of upper lobe, unspecified bronchus or lung: Secondary | ICD-10-CM | POA: Insufficient documentation

## 2013-10-31 DIAGNOSIS — I359 Nonrheumatic aortic valve disorder, unspecified: Secondary | ICD-10-CM | POA: Insufficient documentation

## 2013-10-31 DIAGNOSIS — R0609 Other forms of dyspnea: Secondary | ICD-10-CM | POA: Insufficient documentation

## 2013-10-31 HISTORY — PX: VIDEO BRONCHOSCOPY WITH ENDOBRONCHIAL ULTRASOUND: SHX6177

## 2013-10-31 HISTORY — PX: VIDEO BRONCHOSCOPY WITH ENDOBRONCHIAL NAVIGATION: SHX6175

## 2013-10-31 LAB — GLUCOSE, CAPILLARY: Glucose-Capillary: 142 mg/dL — ABNORMAL HIGH (ref 70–99)

## 2013-10-31 SURGERY — VIDEO BRONCHOSCOPY WITH ENDOBRONCHIAL NAVIGATION
Anesthesia: General | Wound class: Clean Contaminated

## 2013-10-31 MED ORDER — ONDANSETRON HCL 4 MG/2ML IJ SOLN: 4.0000 mg | Freq: Once | INTRAMUSCULAR | Status: DC | PRN

## 2013-10-31 MED ORDER — OXYCODONE HCL 5 MG PO TABS: 5.0000 mg | ORAL_TABLET | Freq: Once | ORAL | Status: DC | PRN

## 2013-10-31 MED ORDER — ROCURONIUM BROMIDE 100 MG/10ML IV SOLN
INTRAVENOUS | Status: DC | PRN
  Administered 2013-10-31: 40 mg via INTRAVENOUS

## 2013-10-31 MED ORDER — LIDOCAINE HCL (CARDIAC) 20 MG/ML IV SOLN
INTRAVENOUS | Status: DC | PRN
  Administered 2013-10-31: 60 mg via INTRAVENOUS

## 2013-10-31 MED ORDER — LACTATED RINGERS IV SOLN
INTRAVENOUS | Status: DC | PRN
  Administered 2013-10-31 (×2): via INTRAVENOUS

## 2013-10-31 MED ORDER — OXYCODONE HCL 5 MG/5ML PO SOLN: 5.0000 mg | Freq: Once | ORAL | Status: DC | PRN

## 2013-10-31 MED ORDER — FENTANYL CITRATE 0.05 MG/ML IJ SOLN
INTRAMUSCULAR | Status: DC | PRN
  Administered 2013-10-31: 100 ug via INTRAVENOUS

## 2013-10-31 MED ORDER — NEOSTIGMINE METHYLSULFATE 1 MG/ML IJ SOLN
INTRAMUSCULAR | Status: DC | PRN
  Administered 2013-10-31: 4 mg via INTRAVENOUS

## 2013-10-31 MED ORDER — 0.9 % SODIUM CHLORIDE (POUR BTL) OPTIME
TOPICAL | Status: DC | PRN
  Administered 2013-10-31: 1000 mL

## 2013-10-31 MED ORDER — GLYCOPYRROLATE 0.2 MG/ML IJ SOLN
INTRAMUSCULAR | Status: DC | PRN
  Administered 2013-10-31: 0.6 mg via INTRAVENOUS

## 2013-10-31 MED ORDER — EPHEDRINE SULFATE 50 MG/ML IJ SOLN
INTRAMUSCULAR | Status: DC | PRN
  Administered 2013-10-31 (×3): 5 mg via INTRAVENOUS

## 2013-10-31 MED ORDER — PHENYLEPHRINE HCL 10 MG/ML IJ SOLN
10.0000 mg | INTRAVENOUS | Status: DC | PRN
  Administered 2013-10-31: 25 ug/min via INTRAVENOUS

## 2013-10-31 MED ORDER — HYDROMORPHONE HCL PF 1 MG/ML IJ SOLN: 0.2500 mg | INTRAMUSCULAR | Status: DC | PRN

## 2013-10-31 MED ORDER — PROPOFOL 10 MG/ML IV BOLUS
INTRAVENOUS | Status: DC | PRN
  Administered 2013-10-31: 20 mg via INTRAVENOUS
  Administered 2013-10-31: 50 mg via INTRAVENOUS

## 2013-10-31 MED ORDER — PHENYLEPHRINE HCL 10 MG/ML IJ SOLN
INTRAMUSCULAR | Status: DC | PRN
  Administered 2013-10-31: 80 ug via INTRAVENOUS
  Administered 2013-10-31 (×2): 40 ug via INTRAVENOUS

## 2013-10-31 MED ORDER — MIDAZOLAM HCL 5 MG/5ML IJ SOLN
INTRAMUSCULAR | Status: DC | PRN
  Administered 2013-10-31 (×2): 1 mg via INTRAVENOUS

## 2013-10-31 SURGICAL SUPPLY — 39 items
BRUSH CYTOL CELLEBRITY 1.5X140 (MISCELLANEOUS) ×2 IMPLANT
BRUSH SUPERTRAX BIOPSY (INSTRUMENTS) ×1 IMPLANT
BRUSH SUPERTRAX NDL-TIP CYTO (INSTRUMENTS) ×1 IMPLANT
CANISTER SUCTION 2500CC (MISCELLANEOUS) ×2 IMPLANT
CHANNEL WORK EXTEND EDGE 180 (KITS) IMPLANT
CHANNEL WORK EXTEND EDGE 45 (KITS) IMPLANT
CHANNEL WORK EXTEND EDGE 90 (KITS) IMPLANT
CLOTH BEACON ORANGE TIMEOUT ST (SAFETY) ×2 IMPLANT
CONT SPEC 4OZ CLIKSEAL STRL BL (MISCELLANEOUS) ×2 IMPLANT
COVER TABLE BACK 60X90 (DRAPES) ×2 IMPLANT
FILTER STRAW FLUID ASPIR (MISCELLANEOUS) IMPLANT
FORCEPS BIOP RJ4 1.8 (CUTTING FORCEPS) IMPLANT
FORCEPS BIOP SUPERTRX PREMAR (INSTRUMENTS) IMPLANT
GLOVE BIOGEL M STRL SZ7.5 (GLOVE) ×4 IMPLANT
GOWN STRL NON-REIN LRG LVL3 (GOWN DISPOSABLE) ×3 IMPLANT
KIT LOCATABLE GUIDE (CANNULA) IMPLANT
KIT MARKER FIDUCIAL DELIVERY (KITS) IMPLANT
KIT PROCEDURE EDGE 180 (KITS) IMPLANT
KIT PROCEDURE EDGE 45 (KITS) IMPLANT
KIT PROCEDURE EDGE 90 (KITS) ×1 IMPLANT
KIT ROOM TURNOVER OR (KITS) ×2 IMPLANT
MARKER SKIN DUAL TIP RULER LAB (MISCELLANEOUS) ×2 IMPLANT
NDL BIOPSY TRANSBRONCH 21G (NEEDLE) IMPLANT
NDL SUPERTRX PREMARK BIOPSY (NEEDLE) IMPLANT
NEEDLE BIOPSY TRANSBRONCH 21G (NEEDLE) IMPLANT
NEEDLE SUPERTRX PREMARK BIOPSY (NEEDLE) ×2 IMPLANT
NEEDLE SYS SONOTIP II EBUSTBNA (NEEDLE) ×1 IMPLANT
NS IRRIG 1000ML POUR BTL (IV SOLUTION) ×2 IMPLANT
OIL SILICONE PENTAX (PARTS (SERVICE/REPAIRS)) ×2 IMPLANT
PAD ARMBOARD 7.5X6 YLW CONV (MISCELLANEOUS) ×4 IMPLANT
PATCHES PATIENT (LABEL) ×2 IMPLANT
SPONGE GAUZE 4X4 12PLY (GAUZE/BANDAGES/DRESSINGS) ×2 IMPLANT
SYR 20CC LL (SYRINGE) ×2 IMPLANT
SYR 20ML ECCENTRIC (SYRINGE) ×4 IMPLANT
SYR 5ML LUER SLIP (SYRINGE) ×2 IMPLANT
SYRINGE TOOMEY DISP (SYRINGE) ×2 IMPLANT
TOWEL OR 17X24 6PK STRL BLUE (TOWEL DISPOSABLE) ×2 IMPLANT
TRAP SPECIMEN MUCOUS 40CC (MISCELLANEOUS) ×2 IMPLANT
TUBE CONNECTING 12X1/4 (SUCTIONS) ×4 IMPLANT

## 2013-10-31 NOTE — Interval H&P Note (Signed)
PCCM Interval Hx:   S: no new complaints or issues reported by Mr Kinslow. He was seen by Dr Herbie Baltimore with Cardiology and was started on a Ca channel blocker given his A fib. He was deemed appropriate to proceed. TTE 11/17 as below. He still has cough in the am, clear mucous. No CP, stable exertional SOB.   Filed Vitals:   10/31/13 0715  BP: 158/79  Pulse: 65  Temp: 96.6 F (35.9 C)  TempSrc: Oral  Resp: 20  SpO2: 99%   Gen: Pleasant, well-nourished, in no distress,  normal affect  ENT: No lesions,  mouth clear,  oropharynx clear, no postnasal drip, M2 airway  Neck: No JVD, no TMG, no carotid bruits  Lungs: No use of accessory muscles, few soft exp wheezes  Cardiovascular: RRR, heart sounds normal, no murmur or gallops, no peripheral edema  Musculoskeletal: No deformities, no cyanosis or clubbing  Neuro: alert, non focal  Skin: Warm, no lesions or rashes   Recent Labs Lab 10/26/13 0935  HGB 13.3  HCT 39.3  WBC 6.7  PLT 208    Recent Labs Lab 10/26/13 0935  NA 140  K 4.3  CL 100  CO2 29  GLUCOSE 232*  BUN 8  CREATININE 1.09  CALCIUM 9.5    Recent Labs Lab 10/26/13 0935  INR 1.05   Plans:  - will proceed with FOB under general anesthesia to utilize EBUS and ENB for biopsies.   Levy Pupa, MD, PhD 10/31/2013, 8:25 AM [facility] [phone] or if no answer [phone]

## 2013-10-31 NOTE — Assessment & Plan Note (Signed)
He has no active cardiac symptoms besides expected COPD related dyspnea. He has no other active cardiac symptoms, and is asymptomatic of his atrial fibrillation.  Based on her having COPD, will start calcitonin blocker for rate control. Will begin an echocardiogram to be performed to get a better assessment of his EF. This is not a prerequisite for proceeding with the bronchoscopy procedure which is a low risk procedure.

## 2013-10-31 NOTE — Assessment & Plan Note (Signed)
Only on Cardura. Well-controlled.

## 2013-10-31 NOTE — Anesthesia Preprocedure Evaluation (Addendum)
Anesthesia Evaluation  Patient identified by MRN, date of birth, ID band Patient awake    Reviewed: Allergy & Precautions, H&P , NPO status , Patient's Chart, lab work & pertinent test results  Airway Mallampati: II TM Distance: >3 FB Neck ROM: Full    Dental  (+) Edentulous Upper and Edentulous Lower   Pulmonary shortness of breath and with exertion, COPD COPD inhaler, former smoker,  breath sounds clear to auscultation        Cardiovascular hypertension, Pt. on medications + dysrhythmias Atrial Fibrillation Rhythm:Irregular Rate:Normal     Neuro/Psych    GI/Hepatic   Endo/Other  diabetes, Well Controlled, Type 2, Oral Hypoglycemic Agents  Renal/GU      Musculoskeletal   Abdominal   Peds  Hematology   Anesthesia Other Findings   Reproductive/Obstetrics                         Anesthesia Physical Anesthesia Plan  ASA: III  Anesthesia Plan: General   Post-op Pain Management:    Induction: Intravenous  Airway Management Planned: Oral ETT  Additional Equipment:   Intra-op Plan:   Post-operative Plan: Extubation in OR  Informed Consent: I have reviewed the patients History and Physical, chart, labs and discussed the procedure including the risks, benefits and alternatives for the proposed anesthesia with the patient or authorized representative who has indicated his/her understanding and acceptance.   Dental advisory given  Plan Discussed with: CRNA and Anesthesiologist  Anesthesia Plan Comments:         Anesthesia Quick Evaluation

## 2013-10-31 NOTE — Op Note (Signed)
  Video Bronchoscopy with Electromagnetic Navigation Procedure Note  Date of Operation: 10/31/2013  Pre-op Diagnosis: right upper lobe nodules  Post-op Diagnosis: same  Surgeon: Levy Pupa  Assistants: none  Anesthesia: General endotracheal anesthesia  Operation: Flexible video fiberoptic bronchoscopy with electromagnetic navigation and biopsies.  Estimated Blood Loss: 10 cc  Complications: none apparent  Indications and History: Brent Wade is a 72 y.o. male with history of tobacco. He was found to have 2 right upper lobe nodules concerning for possible malignancy. Recommendation was made to pursue tissue diagnosis via fiberoptic bronchoscopy with electromagnetic navigation.  The risks, benefits, complications, treatment options and expected outcomes were discussed with the patient.  The possibilities of pneumothorax, pneumonia, reaction to medication, pulmonary aspiration, perforation of a viscus, bleeding, failure to diagnose a condition and creating a complication requiring transfusion or operation were discussed with the patient who freely signed the consent.    Description of Procedure: The patient was seen in the Preoperative Area, was examined and was deemed appropriate to proceed.  The patient was taken to [facility] OR10, identified as Brent Wade and the procedure verified as Flexible Video Fiberoptic Bronchoscopy.  A Time Out was held and the above information confirmed.   Prior to the date of the procedure a high-resolution CT scan of the chest was performed. Utilizing superDimension software a virtual tracheobronchial tree was generated to allow the creation of distinct navigation pathways to the patient's right upper lobe parenchymal abnormalities. After being taken to the operating room general anesthesia was initiated and the patient  was orally intubated. The video fiberoptic bronchoscope was introduced via the endotracheal tube and a general inspection was performed  which showed significant narrowing of the bronchus intermedius. The medial wall of the bronchus intermedius was erythematous and hypervascular. The scope could be passed through the narrow BI and revealed a severely narrowed right middle lobe orifice. Again the scope could be advanced and the distal right middle lobe airways appeared to be normal. The right lower lobe airways were normal. The left-sided exam was normal. The extendable working channel and locator guide were introduced into the bronchoscope. The distinct navigation pathways prepared prior to this procedure were then utilized to navigate to within 0.9 cm of patient's lesion(s) identified on CT scan. The extendable working channel was secured into place and the locator guide was withdrawn. Under fluoroscopic guidance transbronchial needle brushings, transbronchial Wang needle biopsies, and transbronchial forceps biopsies were performed to be sent for cytology and pathology. Bronchial washings were pooled to be sent for cytology.  At the end of the procedure a general airway inspection was performed and there was no evidence of active bleeding. The bronchoscope was removed.  The patient tolerated the procedure well. There was no significant blood loss and there were no obvious complications. A post-procedural chest x-ray is pending.  Samples: 1. Transbronchial needle brushings from right upper lobe target 1 2. Transbronchial Wang needle biopsies from right upper lobe target 1 3. Transbronchial forceps biopsies from right upper lobe target 1 4. Transbronchial needle brushings from right upper lobe target 2  Plans:  The patient will be discharged from the PACU to home when recovered from anesthesia and after chest x-ray is reviewed. We will review the cytology, pathology results with the patient when they become available. Outpatient followup will be with Dr Delton Coombes or Dr Sherene Sires.   Levy Pupa, MD, PhD 10/31/2013, 10:09 AM [place] [phone] or if no answer [phone]

## 2013-10-31 NOTE — Assessment & Plan Note (Signed)
On statin, monitored by PCP. 

## 2013-10-31 NOTE — Transfer of Care (Signed)
Immediate Anesthesia Transfer of Care Note  Patient: Brent Wade  Procedure(s) Performed: Procedure(s): VIDEO BRONCHOSCOPY WITH ENDOBRONCHIAL NAVIGATION (N/A) VIDEO BRONCHOSCOPY WITH ENDOBRONCHIAL ULTRASOUND (N/A)  Patient Location: PACU  Anesthesia Type:General  Level of Consciousness: awake, alert  and oriented  Airway & Oxygen Therapy: Patient connected to face mask oxygen  Post-op Assessment: Report given to PACU RN  Post vital signs: stable  Complications: No apparent anesthesia complications

## 2013-10-31 NOTE — Assessment & Plan Note (Signed)
New diagnoses of major fibrillation, likely persistent/potentially chronic. Unknown duration, asymptomatic.   Diltiazem 60 mg twice a day  2-D echo today - will add addendum to note with summary  Should be stable for bronchoscopy without any further cardiac evaluation simply based on lack of symptoms.  Following bronchoscopy, however, he will need to initiate anticoagulation based upon his CHA2DS2-VASC score of 3 (age, hypertension and diabetes - without knowledge of EF)  We will set up an appointment with him to see our pharmacist, Phillips Hay to discuss potential and correlation options including warfarin, Xarelto or Eliquis  He will also need an ischemic evaluation based on his risk factors noted above: Hypertension, diabetes, hyperlipidemia, chronic smoker --  will order a LexiScan Myoview to be performed post-procedure

## 2013-10-31 NOTE — Anesthesia Postprocedure Evaluation (Signed)
  Anesthesia Post-op Note  Patient: Brent Wade  Procedure(s) Performed: Procedure(s): VIDEO BRONCHOSCOPY WITH ENDOBRONCHIAL NAVIGATION (N/A) VIDEO BRONCHOSCOPY WITH ENDOBRONCHIAL ULTRASOUND (N/A)  Patient Location: PACU  Anesthesia Type:General  Level of Consciousness: awake, alert  and oriented  Airway and Oxygen Therapy: Patient Spontanous Breathing and Patient connected to nasal cannula oxygen  Post-op Pain: none  Post-op Assessment: Post-op Vital signs reviewed, Patient's Cardiovascular Status Stable, Respiratory Function Stable, Patent Airway and Pain level controlled  Post-op Vital Signs: stable  Complications: No apparent anesthesia complications

## 2013-10-31 NOTE — Assessment & Plan Note (Signed)
He has chronic dyspnea on exertion, however with new diagnosis of atrial fibrillation, will check 2-D echocardiogram and Myoview for structural as well as ischemiccardiac evaluation.

## 2013-10-31 NOTE — Anesthesia Procedure Notes (Signed)
Procedure Name: Intubation Date/Time: 10/31/2013 8:32 AM Performed by: Ellin Goodie Pre-anesthesia Checklist: Patient identified, Emergency Drugs available, Suction available, Patient being monitored and Timeout performed Patient Re-evaluated:Patient Re-evaluated prior to inductionOxygen Delivery Method: Circle system utilized Preoxygenation: Pre-oxygenation with 100% oxygen Intubation Type: IV induction Ventilation: Mask ventilation without difficulty Laryngoscope Size: Mac and 3 Grade View: Grade I Tube type: Oral Tube size: 8.5 mm Airway Equipment and Method: Stylet Placement Confirmation: ETT inserted through vocal cords under direct vision,  positive ETCO2 and breath sounds checked- equal and bilateral Secured at: 23 cm Tube secured with: Tape Dental Injury: Teeth and Oropharynx as per pre-operative assessment  Comments: Easy atraumatic induction and intubation with MAC 3 blade.  Dr. Noreene Larsson verified placement of ETT.  Carlynn Herald, CRNA

## 2013-10-31 NOTE — H&P (View-Only) (Signed)
HPI:  71-year-old man, former smoker (60 pack years), history of diabetes, hyperlipidemia and COPD ( FEV1 0.93L). He has been followed by Dr. Wert for a lung mass with right hilar lymphadenopathy. Bronchoscopy revealed extrinsic compression of the bronchus intermedius. Needle biopsies were performed on 08/21/13 but the cytology was negative. He is referred now for consideration of repeat bronchoscopy and possibly EBUS. He is on Tudorza bid, Symbicort bit, SABA once a day.    Past Medical History  Diagnosis Date  . Hyperlipidemia   . Type II or unspecified type diabetes mellitus without mention of complication, not stated as uncontrolled   . Chronic airway obstruction, not elsewhere classified     HFA 25-50% 07-16-2010> 100% 01-28-2011; PFT's 08-13-10 FEV1 .93(34%) ratio 37 with 23% better p B2 DLCO 69%     No family history on file.   History   Social History  . Marital Status: Married    Spouse Name: N/A    Number of Children: N/A  . Years of Education: N/A   Occupational History  . Not on file.   Social History Main Topics  . Smoking status: Former Smoker -- 1.50 packs/day for 40 years    Types: Cigarettes    Quit date: 12/13/2002  . Smokeless tobacco: Current User    Types: Chew  . Alcohol Use: No  . Drug Use: No  . Sexual Activity: Not on file   Other Topics Concern  . Not on file   Social History Narrative  . No narrative on file     No Known Allergies   Outpatient Prescriptions Prior to Visit  Medication Sig Dispense Refill  . Aclidinium Bromide (TUDORZA PRESSAIR) 400 MCG/ACT AEPB Inhale 1 puff into the lungs 2 (two) times daily. One twice daily  1 each  11  . albuterol (PROAIR HFA) 108 (90 BASE) MCG/ACT inhaler Inhale 2 puffs into the lungs every 6 (six) hours as needed.  1 Inhaler  0  . budesonide-formoterol (SYMBICORT) 160-4.5 MCG/ACT inhaler Inhale 2 puffs into the lungs 2 (two) times daily.        . doxazosin (CARDURA) 2 MG tablet Take 2 mg by mouth daily.         . guaiFENesin (MUCINEX) 600 MG 12 hr tablet Take 1,200 mg by mouth 2 (two) times daily as needed.       . metFORMIN (GLUCOPHAGE) 500 MG tablet Take 500 mg by mouth 2 (two) times daily with a meal.       . simvastatin (ZOCOR) 40 MG tablet Take 40 mg by mouth at bedtime.        . predniSONE (STERAPRED UNI-PAK) 10 MG tablet Prednisone 10 mg take  2 daily with breakfast until better then 1 daily  100 tablet  0   No facility-administered medications prior to visit.    Filed Vitals:   10/17/13 0950  BP: 130/80  Pulse: 83  Height: 5' 8" (1.727 m)  Weight: 177 lb 9.6 oz (80.559 kg)  SpO2: 98%   Gen: Pleasant, well-nourished, in no distress,  normal affect  ENT: No lesions,  mouth clear,  oropharynx clear, no postnasal drip  Neck: No JVD, no TMG, no carotid bruits  Lungs: No use of accessory muscles, clear without rales or rhonchi  Cardiovascular: irreg irreg, heart sounds normal, no murmur or gallops, no peripheral edema  Musculoskeletal: No deformities, no cyanosis or clubbing  Neuro: alert, non focal  Skin: Warm, no lesions or rashes  ECG today >> A Fib   with rate 75, no ischemic changes.   08/16/13 --  Comparison: Chest x-ray 06/13/2013.  Findings:  Mediastinum: Heart size is normal. There is no significant  pericardial fluid, thickening or pericardial calcification. There  is atherosclerosis of the thoracic aorta, the great vessels of the  mediastinum and the coronary arteries, including calcified  atherosclerotic plaque in the left main, left anterior descending,  left circumflex and right coronary arteries. Enlarged right hilar  lymph node measuring 1.6 x 2.2 cm (image 26 of series 2). No  definite pathologically enlarged mediastinal or left hilar lymph  nodes are noted. Esophagus is unremarkable in appearance. Mild  calcifications of the aortic valve.  Lungs/Pleura: In the right upper lobe there is a 4.2 x 2.9 cm  macrolobulated mass with spiculated margins (image 20 of  series 3).  In addition, more posteriorly in the right upper lobe there is a  1.5 x 1.4 cm macrolobulated nodule with some spiculated margins. A  1.7 x 1.4 cm nodular area of ground-glass attenuation in the  posterior aspect of the left lower lobe (image 26 of series three)  is also noted. There is a background of mild to moderate central  lobular and paraseptal emphysema.  Upper Abdomen: Surgical clips near the celiac axis. Mild  adreniform thickening of the right adrenal gland which is  incompletely visualized.  Musculoskeletal: A small fatty attenuation lesion in the right  serratus anterior musculature, likely represent an intramuscular  lipoma. There are no aggressive appearing lytic or blastic lesions  noted in the visualized portions of the skeleton.  IMPRESSION:  1. Aggressive appearing right upper lobe pulmonary mass and nodule  with a right hilar lymphadenopathy, as detailed above. This is  favored to represent a primary bronchogenic neoplasm with nodal  metastasis to the right hilum and satellite nodule in the same lobe  (i.e., T3, N1, Mx disease), however, this could alternatively  represent synchronous right upper lobe neoplasms, or less likely  an aggressive atypical infectious process such as a fungal  pneumonia (not strongly favored). Clinical correlation is strongly  recommended, with consideration for PET CT and/or biopsy at this  time.  2. In addition, there is a nonspecific 1.7 x 1.4 cm ground-glass  attenuation nodule in the left lower lobe which warrants attention  on follow-up studies. Per guidelines the initial follow-up by  chest CT without contrast is recommended in 3 months to confirm  persistence, however, this lesion could likely be followed on any  routine follow-up scans performed for the findings in number 1  above.  3. Atherosclerosis, including left main and three-vessel coronary  artery disease. Assessment for potential risk factor modification,   dietary therapy or pharmacologic therapy may be warranted, if  clinically indicated.  4. There are calcifications of the aortic valve. Echocardiographic  correlation for evaluation of potential valvular dysfunction may be  warranted if clinically indicated.  5. Additional incidental findings, as above.   Pulmonary nodule I believe he needs repeat biopsy by bronchoscopy. This should likely be done under general anesthesia to facilitate ultrasound and also navigation. He will need cardiology risk stratification before we perform the procedure.   COPD GOLD III/IV Continue same inhaled medications    

## 2013-11-01 ENCOUNTER — Ambulatory Visit (HOSPITAL_COMMUNITY)
Admission: RE | Admit: 2013-11-01 | Discharge: 2013-11-01 | Disposition: A | Discharge status: Home / Self Care | Payer: Medicare Other | Source: Ambulatory Visit | Attending: Cardiovascular Disease | Admitting: Cardiovascular Disease

## 2013-11-01 ENCOUNTER — Ambulatory Visit (INDEPENDENT_AMBULATORY_CARE_PROVIDER_SITE_OTHER): Payer: Medicare Other | Admitting: Pharmacist Clinician (PhC)/ Clinical Pharmacy Specialist

## 2013-11-01 DIAGNOSIS — I4891 Unspecified atrial fibrillation: Secondary | ICD-10-CM

## 2013-11-01 DIAGNOSIS — R0600 Dyspnea, unspecified: Secondary | ICD-10-CM

## 2013-11-01 MED ORDER — WARFARIN SODIUM 5 MG PO TABS: 5.0000 mg | ORAL_TABLET | Freq: Every day | ORAL | Status: DC

## 2013-11-01 NOTE — Patient Instructions (Signed)
Start the warfarin 5mg  each evening  Come back next Tuesday November 25 for blood work

## 2013-11-01 NOTE — Progress Notes (Signed)
Pt came in to office today to discuss options for anticoagulation.  He saw Dr. Herbie Baltimore on 11/17 and was found to be in asymptomatic atrial fibrillation.  However, he had a bronchoscopy on 11/19 and therefore was asked to return today to discuss options for anticoagulation.  His concern today relates mostly to costs of medications and we had a 10 minute discussion of the options and their potential costs.  He has decided to go with warfarin and understands the need for regular INR checks, especially weekly for the first few weeks, to keep the medication therapeutic.  His medications were reviewed, nothing of concern as far as interactions with warfarin.  I have asked him to start with 5mg  daily, tonight if able, and return to the office on Tuesday for his first INR check.  He stated that he has coughed up blood a couple of times since the bronchoscopy yesterday, but as the warfarin will take several days to become therapeutic, I asked that he go ahead and start the medication tonight.  Pt then went to have a stress test, which he ultimately decided to cancel, but had the technician come to clarify with me that he should start the medication tonight.

## 2013-11-02 ENCOUNTER — Encounter (HOSPITAL_COMMUNITY): Payer: Self-pay | Admitting: Emergency Medicine

## 2013-11-02 ENCOUNTER — Encounter: Payer: Self-pay | Admitting: Internal Medicine

## 2013-11-06 ENCOUNTER — Other Ambulatory Visit: Payer: Self-pay | Admitting: Hematology and Oncology

## 2013-11-06 ENCOUNTER — Encounter (HOSPITAL_COMMUNITY): Payer: Self-pay

## 2013-11-06 ENCOUNTER — Inpatient Hospital Stay (HOSPITAL_COMMUNITY): Payer: Medicare Other

## 2013-11-06 ENCOUNTER — Inpatient Hospital Stay: Payer: Medicare Other | Admitting: Internal Medicine

## 2013-11-06 ENCOUNTER — Ambulatory Visit
Admit: 2013-11-06 | Discharge: 2013-11-06 | Disposition: A | Discharge status: Home / Self Care | Payer: Medicare Other | Attending: Radiation Oncology | Admitting: Radiation Oncology

## 2013-11-06 ENCOUNTER — Encounter: Payer: Self-pay | Admitting: Hematology and Oncology

## 2013-11-06 ENCOUNTER — Ambulatory Visit: Payer: Medicare Other | Admitting: Pharmacist Clinician (PhC)/ Clinical Pharmacy Specialist

## 2013-11-06 ENCOUNTER — Encounter (HOSPITAL_COMMUNITY): Payer: Self-pay | Admitting: Hematology and Oncology

## 2013-11-06 ENCOUNTER — Inpatient Hospital Stay (HOSPITAL_COMMUNITY)
Admission: AD | Admit: 2013-11-06 | Discharge: 2013-11-07 | DRG: 191 | Disposition: A | Discharge status: Home / Self Care | Payer: Medicare Other | Source: Ambulatory Visit | Attending: Internal Medicine | Admitting: Internal Medicine

## 2013-11-06 ENCOUNTER — Other Ambulatory Visit: Payer: Self-pay | Admitting: Radiation Oncology

## 2013-11-06 DIAGNOSIS — C3490 Malignant neoplasm of unspecified part of unspecified bronchus or lung: Secondary | ICD-10-CM

## 2013-11-06 DIAGNOSIS — Z01810 Encounter for preprocedural cardiovascular examination: Secondary | ICD-10-CM

## 2013-11-06 DIAGNOSIS — Z87891 Personal history of nicotine dependence: Secondary | ICD-10-CM

## 2013-11-06 DIAGNOSIS — R05 Cough: Secondary | ICD-10-CM

## 2013-11-06 DIAGNOSIS — Z85118 Personal history of other malignant neoplasm of bronchus and lung: Secondary | ICD-10-CM

## 2013-11-06 DIAGNOSIS — C3410 Malignant neoplasm of upper lobe, unspecified bronchus or lung: Secondary | ICD-10-CM

## 2013-11-06 DIAGNOSIS — E119 Type 2 diabetes mellitus without complications: Secondary | ICD-10-CM

## 2013-11-06 DIAGNOSIS — R0609 Other forms of dyspnea: Secondary | ICD-10-CM

## 2013-11-06 DIAGNOSIS — Z8249 Family history of ischemic heart disease and other diseases of the circulatory system: Secondary | ICD-10-CM

## 2013-11-06 DIAGNOSIS — E785 Hyperlipidemia, unspecified: Secondary | ICD-10-CM

## 2013-11-06 DIAGNOSIS — D638 Anemia in other chronic diseases classified elsewhere: Secondary | ICD-10-CM | POA: Diagnosis present

## 2013-11-06 DIAGNOSIS — Z7901 Long term (current) use of anticoagulants: Secondary | ICD-10-CM

## 2013-11-06 DIAGNOSIS — R911 Solitary pulmonary nodule: Secondary | ICD-10-CM

## 2013-11-06 DIAGNOSIS — J441 Chronic obstructive pulmonary disease with (acute) exacerbation: Principal | ICD-10-CM

## 2013-11-06 DIAGNOSIS — D649 Anemia, unspecified: Secondary | ICD-10-CM

## 2013-11-06 DIAGNOSIS — I10 Essential (primary) hypertension: Secondary | ICD-10-CM

## 2013-11-06 DIAGNOSIS — R059 Cough, unspecified: Secondary | ICD-10-CM

## 2013-11-06 DIAGNOSIS — I4891 Unspecified atrial fibrillation: Secondary | ICD-10-CM

## 2013-11-06 DIAGNOSIS — Z833 Family history of diabetes mellitus: Secondary | ICD-10-CM

## 2013-11-06 DIAGNOSIS — J449 Chronic obstructive pulmonary disease, unspecified: Secondary | ICD-10-CM

## 2013-11-06 DIAGNOSIS — R918 Other nonspecific abnormal finding of lung field: Secondary | ICD-10-CM

## 2013-11-06 DIAGNOSIS — Z801 Family history of malignant neoplasm of trachea, bronchus and lung: Secondary | ICD-10-CM

## 2013-11-06 HISTORY — DX: Malignant neoplasm of unspecified part of unspecified bronchus or lung: C34.90

## 2013-11-06 LAB — COMPREHENSIVE METABOLIC PANEL
ALT: 7 U/L (ref 0–53)
AST: 11 U/L (ref 0–37)
Albumin: 3.6 g/dL (ref 3.5–5.2)
Alkaline Phosphatase: 74 U/L (ref 39–117)
BUN: 10 mg/dL (ref 6–23)
Creatinine, Ser: 1.08 mg/dL (ref 0.50–1.35)
GFR calc non Af Amer: 66 mL/min — ABNORMAL LOW (ref >90)
Potassium: 4.2 mEq/L (ref 3.5–5.1)
Total Bilirubin: 0.2 mg/dL — ABNORMAL LOW (ref 0.3–1.2)
Total Protein: 7.1 g/dL (ref 6.0–8.3)

## 2013-11-06 LAB — CBC WITH DIFFERENTIAL/PLATELET
Basophils Absolute: 0 10*3/uL (ref 0.0–0.1)
Eosinophils Absolute: 0 10*3/uL (ref 0.0–0.7)
Eosinophils Relative: 0 % (ref 0–5)
HCT: 37.4 % — ABNORMAL LOW (ref 39.0–52.0)
Lymphocytes Relative: 15 % (ref 12–46)
Lymphs Abs: 1 10*3/uL (ref 0.7–4.0)
MCH: 30.7 pg (ref 26.0–34.0)
MCV: 93.3 fL (ref 78.0–100.0)
Monocytes Absolute: 0.8 10*3/uL (ref 0.1–1.0)
Neutrophils Relative %: 73 % (ref 43–77)
Platelets: 236 10*3/uL (ref 150–400)
RBC: 4.01 MIL/uL — ABNORMAL LOW (ref 4.22–5.81)
RDW: 13.2 % (ref 11.5–15.5)
WBC: 6.8 10*3/uL (ref 4.0–10.5)

## 2013-11-06 LAB — GLUCOSE, CAPILLARY
Glucose-Capillary: 179 mg/dL — ABNORMAL HIGH (ref 70–99)
Glucose-Capillary: 232 mg/dL — ABNORMAL HIGH (ref 70–99)

## 2013-11-06 LAB — PROTIME-INR: Prothrombin Time: 18.9 seconds — ABNORMAL HIGH (ref 11.6–15.2)

## 2013-11-06 MED ORDER — METHYLPREDNISOLONE SODIUM SUCC 40 MG IJ SOLR
40.0000 mg | Freq: Two times a day (BID) | INTRAMUSCULAR | Status: DC
  Administered 2013-11-06 – 2013-11-07 (×2): 40 mg via INTRAVENOUS
  Filled 2013-11-06 (×4): qty 1

## 2013-11-06 MED ORDER — PANTOPRAZOLE SODIUM 40 MG PO TBEC
40.0000 mg | DELAYED_RELEASE_TABLET | Freq: Every day | ORAL | Status: DC
  Administered 2013-11-06 – 2013-11-07 (×2): 40 mg via ORAL
  Filled 2013-11-06 (×2): qty 1

## 2013-11-06 MED ORDER — GADOBENATE DIMEGLUMINE 529 MG/ML IV SOLN
15.0000 mL | Freq: Once | INTRAVENOUS | Status: AC | PRN
  Administered 2013-11-06: 15 mL via INTRAVENOUS

## 2013-11-06 MED ORDER — WARFARIN - PHARMACIST DOSING INPATIENT: Freq: Every day | Status: DC

## 2013-11-06 MED ORDER — DILTIAZEM HCL 60 MG PO TABS
60.0000 mg | ORAL_TABLET | Freq: Two times a day (BID) | ORAL | Status: DC
  Administered 2013-11-06 – 2013-11-07 (×2): 60 mg via ORAL
  Filled 2013-11-06 (×3): qty 1

## 2013-11-06 MED ORDER — ALBUTEROL SULFATE (5 MG/ML) 0.5% IN NEBU
2.5000 mg | INHALATION_SOLUTION | Freq: Four times a day (QID) | RESPIRATORY_TRACT | Status: DC
  Administered 2013-11-06 – 2013-11-07 (×3): 2.5 mg via RESPIRATORY_TRACT
  Filled 2013-11-06 (×4): qty 0.5

## 2013-11-06 MED ORDER — WARFARIN SODIUM 7.5 MG PO TABS
7.5000 mg | ORAL_TABLET | Freq: Once | ORAL | Status: AC
  Administered 2013-11-06: 7.5 mg via ORAL
  Filled 2013-11-06: qty 1

## 2013-11-06 MED ORDER — ATORVASTATIN CALCIUM 10 MG PO TABS
10.0000 mg | ORAL_TABLET | Freq: Every day | ORAL | Status: DC
  Administered 2013-11-06: 10 mg via ORAL
  Filled 2013-11-06 (×2): qty 1

## 2013-11-06 MED ORDER — SIMVASTATIN 20 MG PO TABS: 20.0000 mg | ORAL_TABLET | Freq: Every day | ORAL | Status: DC

## 2013-11-06 MED ORDER — ALBUTEROL SULFATE (5 MG/ML) 0.5% IN NEBU: 2.5000 mg | INHALATION_SOLUTION | RESPIRATORY_TRACT | Status: DC | PRN

## 2013-11-06 MED ORDER — ACETAMINOPHEN 325 MG PO TABS: 650.0000 mg | ORAL_TABLET | Freq: Four times a day (QID) | ORAL | Status: DC | PRN

## 2013-11-06 MED ORDER — SODIUM CHLORIDE 0.9 % IV SOLN
INTRAVENOUS | Status: DC
  Administered 2013-11-06: 10 mL/h via INTRAVENOUS

## 2013-11-06 MED ORDER — IPRATROPIUM BROMIDE 0.02 % IN SOLN
0.5000 mg | Freq: Four times a day (QID) | RESPIRATORY_TRACT | Status: DC
  Administered 2013-11-06 – 2013-11-07 (×3): 0.5 mg via RESPIRATORY_TRACT
  Filled 2013-11-06 (×4): qty 2.5

## 2013-11-06 MED ORDER — ACETAMINOPHEN 650 MG RE SUPP: 650.0000 mg | Freq: Four times a day (QID) | RECTAL | Status: DC | PRN

## 2013-11-06 MED ORDER — HEPARIN SODIUM (PORCINE) 5000 UNIT/ML IJ SOLN
5000.0000 [IU] | Freq: Three times a day (TID) | INTRAMUSCULAR | Status: DC
  Filled 2013-11-06 (×3): qty 1

## 2013-11-06 MED ORDER — INSULIN ASPART 100 UNIT/ML ~~LOC~~ SOLN
0.0000 [IU] | SUBCUTANEOUS | Status: DC
Start: 2013-11-06 — End: 2013-11-07
  Administered 2013-11-06: 5 [IU] via SUBCUTANEOUS
  Administered 2013-11-06 – 2013-11-07 (×2): 3 [IU] via SUBCUTANEOUS
  Administered 2013-11-07: 5 [IU] via SUBCUTANEOUS
  Administered 2013-11-07: 3 [IU] via SUBCUTANEOUS

## 2013-11-06 MED ORDER — DOXAZOSIN MESYLATE 2 MG PO TABS
2.0000 mg | ORAL_TABLET | Freq: Every day | ORAL | Status: DC
  Administered 2013-11-07: 2 mg via ORAL
  Filled 2013-11-06: qty 1

## 2013-11-06 MED ORDER — ASPIRIN EC 81 MG PO TBEC
81.0000 mg | DELAYED_RELEASE_TABLET | Freq: Every day | ORAL | Status: DC
  Administered 2013-11-06 – 2013-11-07 (×2): 81 mg via ORAL
  Filled 2013-11-06 (×2): qty 1

## 2013-11-06 MED ORDER — BUDESONIDE-FORMOTEROL FUMARATE 160-4.5 MCG/ACT IN AERO
2.0000 | INHALATION_SPRAY | Freq: Two times a day (BID) | RESPIRATORY_TRACT | Status: DC
  Administered 2013-11-06 – 2013-11-07 (×2): 2 via RESPIRATORY_TRACT
  Filled 2013-11-06: qty 6

## 2013-11-06 NOTE — H&P (Signed)
PULMONARY  / CRITICAL CARE MEDICINE  Name: Brent Wade MRN: [id number] DOB: [birth year]    ADMISSION DATE:  11/06/2013  PRIMARY SERVICE:  PCCM  CHIEF COMPLAINT:  COPD, ILD  BRIEF PATIENT DESCRIPTION: 1 yowm  With GOLD III COPD  admitted 11/25 with AECOPD. 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  Bronchial washings w adenocarcinoma ....................................................................................................Marland Kitchen 11/25 - Admit with SOB / AECOPD.  ONC evaluation while inpatient.    HISTORY OF PRESENT ILLNESS:  72 y/o M, former smoker (60 pk years) with PMH of DM, HLD, Afib on coumadin, COPD (FEV1 0.93L) who was admitted 11/25 with AECOPD.  Needle biopsies were performed on 08/21/13 but the cytology was negative. Recent CT in Sept 2014 with RUL mass / nodule & hilar lymphadenopathy leading to ENB 11/19 with non-small cell carcinoma c/w adenocarcinoma. He is on Tudorza bid, Symbicort bit, SABA once a day.  Patient called office complaining of worsening shortness of breath to the point of dyspnea at rest.  Has had difficulties with daily activities due to dyspnea.  Morning cough with increased yellow sputum production but improves as the day goes on.  Reports no relief with PRN albuterol.  Denies fevers, chills, nausea, vomiting, diarrhea, chest pain, pain with inspiration.  Denies hemoptysis.    No obvious day to day or daytime variabilty or assoc   chest tightness, subjective wheeze overt sinus or hb symptoms. No unusual exp hx or h/o childhood pna/ asthma or  knowledge of premature birth.  Sleeping ok without nocturnal  or early am exacerbation  of respiratory  c/o's or need for noct saba. Also denies any obvious fluctuation of symptoms with weather or environmental changes or other aggravating or alleviating factors except as outlined above   Current Medications, Allergies, Complete Past Medical History, Past Surgical History, Family History, and Social History were reviewed in [employer] record.          PAST MEDICAL HISTORY :  Past Medical History  Diagnosis Date  . Hyperlipidemia   . Type II or unspecified type diabetes mellitus without mention of complication, not stated as uncontrolled   . Chronic airway obstruction, not elsewhere classified     HFA 25-50% 07-16-2010> 100% 01-28-2011; PFT's 08-13-10 FEV1 .93(34%) ratio 37 with 23% better p B2 DLCO 69%  . Shortness of breath   . Hypertension   . Arthritis     in hands  . Dysrhythmia     Atrial Fibrillation; takes Cardizem   Past Surgical History  Procedure Laterality Date  . Video bronchoscopy Bilateral 08/21/2013    Procedure: VIDEO BRONCHOSCOPY WITH FLUORO;  Surgeon: Nyoka Cowden, MD;  Location: [facility];  Service: Cardiopulmonary;  Laterality: Bilateral;  . 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 Cholecystectomy  1995  . Eye surgery Bilateral     cataracts  . Video bronchoscopy with endobronchial navigation N/A 10/31/2013    Procedure: VIDEO BRONCHOSCOPY WITH ENDOBRONCHIAL NAVIGATION;  Surgeon: Leslye Peer, MD;  Location: [facility] OR;  Service: Thoracic;  Laterality: N/A;  .  Video bronchoscopy with endobronchial ultrasound N/A 10/31/2013    Procedure: VIDEO BRONCHOSCOPY WITH ENDOBRONCHIAL ULTRASOUND;  Surgeon: Leslye Peer, MD;  Location: [facility] OR;  Service: Thoracic;  Laterality: N/A;   Prior to Admission medications   Medication Sig Start Date End Date Taking? Authorizing Provider  Aclidinium Bromide (TUDORZA  PRESSAIR) 400 MCG/ACT AEPB Inhale 1 puff into the lungs 2 (two) times daily.   Yes Historical Provider, MD  albuterol (PROVENTIL HFA;VENTOLIN HFA) 108 (90 BASE) MCG/ACT inhaler Inhale 2 puffs into the lungs every 6 (six) hours as needed for wheezing or shortness of breath.    Yes Historical Provider, MD  budesonide-formoterol (SYMBICORT) 160-4.5 MCG/ACT inhaler Inhale 2 puffs into the lungs 2 (two) times daily.     Yes Historical Provider, MD  diltiazem (CARDIZEM) 60 MG tablet Take 1 tablet (60 mg total) by mouth 2 (two) times daily. 10/29/13  Yes Marykay Lex, MD  doxazosin (CARDURA) 2 MG tablet Take 2 mg by mouth daily.     Yes Historical Provider, MD  metFORMIN (GLUCOPHAGE) 500 MG tablet Take 500 mg by mouth 2 (two) times daily with a meal.    Yes Historical Provider, MD  simvastatin (ZOCOR) 40 MG tablet Take 0.5 tablets (20 mg total) by mouth at bedtime. 10/29/13  Yes Marykay Lex, MD  warfarin (COUMADIN) 5 MG tablet Take 1 tablet (5 mg total) by mouth daily. 11/01/13  Yes Phillips Hay, RPH-CPP   No Known Allergies  FAMILY HISTORY:  Family History  Problem Relation Age of Onset  . Diabetes Father   . Diabetes Brother   . Diabetes Sister   . Lung cancer Sister   . Liver disease Sister   . Lung cancer Daughter   . Diabetes Daughter   . Hypertension Daughter   . Hyperlipidemia Daughter   . Heart murmur Daughter   . Hyperlipidemia Daughter   . Hypertension Daughter    SOCIAL HISTORY:  reports that he quit smoking about 10 years ago. His smoking use included Cigarettes. He has a 60 pack-year smoking history. His smokeless tobacco use includes Chew. He reports that he does not drink alcohol or use illicit drugs.  REVIEW OF SYSTEMS:   Constitutional: Negative for fever, chills, weight loss, malaise/fatigue and diaphoresis.  HENT: Negative for hearing loss, ear pain, nosebleeds, congestion, sore throat, neck pain, tinnitus and ear discharge.   Eyes: Negative for blurred vision,  double vision, photophobia, pain, discharge and redness.  Respiratory: See HPI Cardiovascular: Negative for chest pain, palpitations, orthopnea, claudication, leg swelling and PND.  Gastrointestinal: Negative for heartburn, nausea, vomiting, abdominal pain, diarrhea, constipation, blood in stool and melena.  Genitourinary: Negative for dysuria, urgency, frequency, hematuria and flank pain.  Musculoskeletal: Negative for myalgias, back pain, joint pain and falls.  Skin: Negative for itching and rash.  Neurological: Negative for dizziness, tingling, tremors, sensory change, speech change, focal weakness, seizures, loss of consciousness, weakness and headaches.  Endo/Heme/Allergies: Negative for environmental allergies and polydipsia. Does not bruise/bleed easily.  SUBJECTIVE:  Reports improved SOB.    VITAL SIGNS: Temp:  [97.6 F (36.4 C)] 97.6 F (36.4 C) (11/25 1223) Pulse Rate:  [86] 86 (11/25 1223) Resp:  [20] 20 (11/25 1223) BP: (113)/(74) 113/74 mmHg (11/25 1223) SpO2:  [98 %] 98 % (11/25 1223) Weight:  [175 lb 8 oz (79.606 kg)] 175 lb 8 oz (79.606 kg) (11/25 1223)  PHYSICAL EXAMINATION: Gen: Pleasant, well-nourished, in no distress, normal affect  ENT: No lesions, mouth clear, oropharynx clear, no  postnasal drip Neck: No JVD, no TMG, no carotid bruits  Lungs: No use of accessory muscles, few soft exp wheezes  Cardiovascular: RRR, heart sounds normal, no murmur or gallops, no peripheral edema  Musculoskeletal: No deformities, no cyanosis or clubbing  Neuro: alert, non focal  Skin: Warm, no lesions or rashes   No results found for this basename: NA, K, CL, CO2, BUN, CREATININE, GLUCOSE,  in the last 168 hours No results found for this basename: HGB, HCT, WBC, PLT,  in the last 168 hours No results found.  ASSESSMENT / PLAN:  AECOPD - progressive dyspnea in setting of adeno   Plan: -continue symbicort -scheduled BD's -hold spiriva with scheduled atrovent  -pulmonary  hygiene -IV solumedrol -wean O2 for sats 88-94%  Adenocarcinoma of RUL  Plan: -consult Radiation Oncology & Oncology for input while inpatient (called)  Atrial Fibrillation - on chronic coumadin HLD  HTN  Plan:  -continue home anti-hypertensive regimen -EKG on admit -continue coumadin per pharmacy  DM  Plan: -hold metformin while inpatient -SSI  -diabetic, heart healthy diet   PX  Plan: -heparin SQ -PPI with steroids / ASA   Canary Brim, NP-C [facility] Pgr: [phone] or [phone]    I have personally obtained history, examined patient, evaluated and interpreted laboratory and imaging results, reviewed medical records, formulated assessment / plan and placed orders.    Sandrea Hughs, MD Pulmonary and Critical Care Medicine [facility] Cell [phone] After 5:30 PM or weekends, call [phone]

## 2013-11-06 NOTE — Consult Note (Signed)
[facility] CONSULT NOTE  Patient Care Team: Blair Heys, MD as PCP - General (Family Medicine) Artis Delay, MD as Consulting Physician (Hematology and Oncology)  CHIEF COMPLAINTS/PURPOSE OF CONSULTATION:  Non-small cell lung cancer, new diagnosis  HISTORY OF PRESENTING ILLNESS:  Brent Wade 72 y.o. male is seen because of newly diagnosed lung cancer. The patient had background history of COPD. He had multiple chest x-ray done, and over the past few years, was noted to have lung nodule.  On 08/16/2013, he had CT scan of the chest which show right upper lobe mass, right upper lobe nodule and right hilar lymphadenopathy as well as abnormal nodule in the left lower lung, worrisome for lung cancer with possible metastasis.  On 08/21/2013, transbronchial biopsy was nondiagnostic. On 10/23/2013, he had repeat CT scan of the chest which show interval enlargement of right upper lobe and the left lower lobe lung nodule was stable right hilar lymphadenopathy. He subsequently underwent repeat bronchoscopy and biopsy which came back positive for non-small cell lung cancer, adenocarcinoma subtype. I was consulted for evaluation and treatment options for this. During bronchoscopy, no endobronchial lesions were seen. After the bronchoscopy, he had significant coughing. He denies any hemoptysis. No recent weight loss. No other palpable lymphadenopathy. Denies any headaches or weakness. MEDICAL HISTORY:  Past Medical History  Diagnosis Date  . Hyperlipidemia   . Type II or unspecified type diabetes mellitus without mention of complication, not stated as uncontrolled   . Chronic airway obstruction, not elsewhere classified     HFA 25-50% 07-16-2010> 100% 01-28-2011; PFT's 08-13-10 FEV1 .93(34%) ratio 37 with 23% better p B2 DLCO 69%  . Shortness of breath   . Hypertension   . Arthritis     in hands  . Dysrhythmia     Atrial Fibrillation; takes Cardizem  . Lung cancer 11/06/2013  . Lung  cancer     SURGICAL HISTORY: Past Surgical History  Procedure Laterality Date  . Video bronchoscopy Bilateral 08/21/2013    Procedure: VIDEO BRONCHOSCOPY WITH FLUORO;  Surgeon: Nyoka Cowden, MD;  Location: [facility]COPY;  Service: Cardiopulmonary;  Laterality: Bilateral;  . 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 Cholecystectomy  1995  . Eye surgery Bilateral     cataracts  . Video bronchoscopy with endobronchial navigation N/A 10/31/2013    Procedure: VIDEO BRONCHOSCOPY WITH ENDOBRONCHIAL NAVIGATION;  Surgeon: Leslye Peer, MD;  [facility]: [facility];  Service: Thoracic;  Laterality: N/A;  . Video bronchoscopy with endobronchial ultrasound N/A 10/31/2013    Procedure: VIDEO BRONCHOSCOPY WITH ENDOBRONCHIAL ULTRASOUND;  Surgeon: Leslye Peer, MD;  Location: [facility] OR;  Service: Thoracic;  Laterality: N/A;    SOCIAL HISTORY: History   Social History  . Marital Status: Married    Spouse Name: N/A    Number of Children: N/A  . Years of Education: N/A   Occupational History  . Not on file.   Social History Main Topics  . Smoking status: Former Smoker -- 1.50 packs/day for 40 years    Types: Cigarettes    Quit date: 12/13/2002  . Smokeless tobacco: Current User    Types: Chew  . Alcohol Use: No  . Drug Use: No  . Sexual Activity: No   Other Topics Concern  . Not on file   Social History Narrative   He has been married for 48 years to his wife Darel Hong.   They have 3 daughters,  2 still alive (Debra died at age 62 of lung cancer) -- 5 grandchildren   Former smoker: Quit 2004; 1.5 pack per day for 40 years; does not drink alcohol or use illicit drugs   Does not get routine exercise.    FAMILY HISTORY: Family History  Problem Relation Age of Onset  . Diabetes Father   . Diabetes Brother   . Diabetes Sister   . Lung cancer Sister   . Liver disease Sister   . Lung cancer Daughter   . Diabetes Daughter   . Hypertension Daughter   .  Hyperlipidemia Daughter   . Heart murmur Daughter   . Hyperlipidemia Daughter   . Hypertension Daughter   . Cancer Daughter     lung cancer  . Cancer Sister     lung cancer    ALLERGIES:  has No Known Allergies.  MEDICATIONS:  Current Facility-Administered Medications  Medication Dose Route Frequency Provider Last Rate Last Dose  . 0.9 %  sodium chloride infusion   Intravenous Continuous Jeanella Craze, NP 10 mL/hr at 11/06/13 1405 10 mL/hr at 11/06/13 1405  . acetaminophen (TYLENOL) tablet 650 mg  650 mg Oral Q6H PRN Jeanella Craze, NP       Or  . acetaminophen (TYLENOL) suppository 650 mg  650 mg Rectal Q6H PRN Jeanella Craze, NP      . albuterol (PROVENTIL) (5 MG/ML) 0.5% nebulizer solution 2.5 mg  2.5 mg Nebulization Q6H Jeanella Craze, NP   2.5 mg at 11/06/13 1448  . albuterol (PROVENTIL) (5 MG/ML) 0.5% nebulizer solution 2.5 mg  2.5 mg Nebulization Q3H PRN Jeanella Craze, NP      . aspirin EC tablet 81 mg  81 mg Oral Daily Jeanella Craze, NP   81 mg at 11/06/13 1405  . atorvastatin (LIPITOR) tablet 10 mg  10 mg Oral QHS Nyoka Cowden, MD      . budesonide-formoterol (SYMBICORT) 160-4.5 MCG/ACT inhaler 2 puff  2 puff Inhalation BID Jeanella Craze, NP      . diltiazem (CARDIZEM) tablet 60 mg  60 mg Oral BID Jeanella Craze, NP      . Melene Muller ON 11/07/2013] doxazosin (CARDURA) tablet 2 mg  2 mg Oral Daily Jeanella Craze, NP      . insulin aspart (novoLOG) injection 0-15 Units  0-15 Units Subcutaneous Q4H Brandi L Ollis, NP      . ipratropium (ATROVENT) nebulizer solution 0.5 mg  0.5 mg Nebulization Q6H Jeanella Craze, NP   0.5 mg at 11/06/13 1448  . methylPREDNISolone sodium succinate (SOLU-MEDROL) 40 mg/mL injection 40 mg  40 mg Intravenous Q12H Jeanella Craze, NP   40 mg at 11/06/13 1426  . pantoprazole (PROTONIX) EC tablet 40 mg  40 mg Oral Daily Jeanella Craze, NP   40 mg at 11/06/13 1426  . warfarin (COUMADIN) tablet 7.5 mg  7.5 mg Oral [address] [place], [facility]       . Warfarin - Pharmacist Dosing Inpatient   Does not apply q1800 Loma Messing [place], RPH        REVIEW OF SYSTEMS:   Constitutional: Denies fevers, chills or abnormal night sweats Eyes: Denies blurriness of vision, double vision or watery eyes Ears, nose, mouth, throat, and face: Denies mucositis or sore throat Cardiovascular: Denies palpitation, chest discomfort or lower extremity swelling Gastrointestinal:  Denies nausea, heartburn or change in bowel habits Skin: Denies abnormal skin rashes Lymphatics: Denies new lymphadenopathy or  easy bruising Neurological:Denies numbness, tingling or new weaknesses Behavioral/Psych: Mood is stable, no new changes  All other systems were reviewed with the patient and are negative.  PHYSICAL EXAMINATION: ECOG PERFORMANCE STATUS: 1 - Symptomatic but completely ambulatory  Filed Vitals:   11/06/13 1223  BP: 113/74  Pulse: 86  Temp: 97.6 F (36.4 C)  Resp: 20   Filed Weights   11/06/13 1223  Weight: 175 lb 8 oz (79.606 kg)    GENERAL:alert, no distress and comfortable. He looked elderly. He had significant coughing during the interview. SKIN: skin color, texture, turgor are normal, no rashes or significant lesions EYES: normal, conjunctiva are pink and non-injected, sclera clear OROPHARYNX:no exudate, no erythema and lips, buccal mucosa, and tongue normal  NECK: supple, thyroid normal size, non-tender, without nodularity LYMPH:  no palpable lymphadenopathy in the cervical, axillary or inguinal LUNGS: clear to auscultation and percussion with normal breathing effort HEART: regular rate & rhythm and no murmurs and no lower extremity edema ABDOMEN:abdomen soft, non-tender and normal bowel sounds Musculoskeletal:no cyanosis of digits and no clubbing  PSYCH: alert & oriented x 3 with fluent speech NEURO: no focal motor/sensory deficits  LABORATORY DATA:  I have reviewed the data as listed Recent Results (from the past 2160 hour(s))   APTT     Status: None   Collection Time    10/26/13  9:35 AM      Result Value Range   aPTT 26  24 - 37 seconds  CBC     Status: None   Collection Time    10/26/13  9:35 AM      Result Value Range   WBC 6.7  4.0 - 10.5 K/uL   RBC 4.30  4.22 - 5.81 MIL/uL   Hemoglobin 13.3  13.0 - 17.0 g/dL   HCT 56.2  13.0 - 86.5 %   MCV 91.4  78.0 - 100.0 fL   MCH 30.9  26.0 - 34.0 pg   MCHC 33.8  30.0 - 36.0 g/dL   RDW 78.4  69.6 - 29.5 %   Platelets 208  150 - 400 K/uL  COMPREHENSIVE METABOLIC PANEL     Status: Abnormal   Collection Time    10/26/13  9:35 AM      Result Value Range   Sodium 140  135 - 145 mEq/L   Potassium 4.3  3.5 - 5.1 mEq/L   Chloride 100  96 - 112 mEq/L   CO2 29  19 - 32 mEq/L   Glucose, Bld 232 (*) 70 - 99 mg/dL   BUN 8  6 - 23 mg/dL   Creatinine, Ser 2.84  0.50 - 1.35 mg/dL   Calcium 9.5  8.4 - 13.2 mg/dL   Total Protein 7.2  6.0 - 8.3 g/dL   Albumin 3.8  3.5 - 5.2 g/dL   AST 11  0 - 37 U/L   ALT 10  0 - 53 U/L   Alkaline Phosphatase 71  39 - 117 U/L   Total Bilirubin 0.5  0.3 - 1.2 mg/dL   GFR calc non Af Amer 65 (*) >90 mL/min   GFR calc Af Amer 76 (*) >90 mL/min   Comment: (NOTE)     The eGFR has been calculated using the CKD EPI equation.     This calculation has not been validated in all clinical situations.     eGFR's persistently <90 mL/min signify possible Chronic Kidney     Disease.  PROTIME-INR     Status: None  Collection Time    10/26/13  9:35 AM      Result Value Range   Prothrombin Time 13.5  11.6 - 15.2 seconds   INR 1.05  0.00 - 1.49  GLUCOSE, CAPILLARY     Status: Abnormal   Collection Time    10/31/13  7:29 AM      Result Value Range   Glucose-Capillary 142 (*) 70 - 99 mg/dL  GLUCOSE, CAPILLARY     Status: Abnormal   Collection Time    10/31/13 10:18 AM      Result Value Range   Glucose-Capillary 160 (*) 70 - 99 mg/dL  COMPREHENSIVE METABOLIC PANEL     Status: Abnormal   Collection Time    11/06/13  1:14 PM      Result Value  Range   Sodium 140  135 - 145 mEq/L   Potassium 4.2  3.5 - 5.1 mEq/L   Chloride 101  96 - 112 mEq/L   CO2 28  19 - 32 mEq/L   Glucose, Bld 199 (*) 70 - 99 mg/dL   BUN 10  6 - 23 mg/dL   Creatinine, Ser 8.29  0.50 - 1.35 mg/dL   Calcium 9.4  8.4 - 56.2 mg/dL   Total Protein 7.1  6.0 - 8.3 g/dL   Albumin 3.6  3.5 - 5.2 g/dL   AST 11  0 - 37 U/L   Comment: SLIGHT HEMOLYSIS     HEMOLYSIS AT THIS LEVEL MAY AFFECT RESULT   ALT 7  0 - 53 U/L   Alkaline Phosphatase 74  39 - 117 U/L   Total Bilirubin 0.2 (*) 0.3 - 1.2 mg/dL   GFR calc non Af Amer 66 (*) >90 mL/min   GFR calc Af Amer 77 (*) >90 mL/min   Comment: (NOTE)     The eGFR has been calculated using the CKD EPI equation.     This calculation has not been validated in all clinical situations.     eGFR's persistently <90 mL/min signify possible Chronic Kidney     Disease.  CBC WITH DIFFERENTIAL     Status: Abnormal   Collection Time    11/06/13  1:14 PM      Result Value Range   WBC 6.8  4.0 - 10.5 K/uL   RBC 4.01 (*) 4.22 - 5.81 MIL/uL   Hemoglobin 12.3 (*) 13.0 - 17.0 g/dL   HCT 13.0 (*) 86.5 - 78.4 %   MCV 93.3  78.0 - 100.0 fL   MCH 30.7  26.0 - 34.0 pg   MCHC 32.9  30.0 - 36.0 g/dL   RDW 69.6  29.5 - 28.4 %   Platelets 236  150 - 400 K/uL   Neutrophils Relative % 73  43 - 77 %   Neutro Abs 4.9  1.7 - 7.7 K/uL   Lymphocytes Relative 15  12 - 46 %   Lymphs Abs 1.0  0.7 - 4.0 K/uL   Monocytes Relative 12  3 - 12 %   Monocytes Absolute 0.8  0.1 - 1.0 K/uL   Eosinophils Relative 0  0 - 5 %   Eosinophils Absolute 0.0  0.0 - 0.7 K/uL   Basophils Relative 0  0 - 1 %   Basophils Absolute 0.0  0.0 - 0.1 K/uL  PROTIME-INR     Status: Abnormal   Collection Time    11/06/13  1:58 PM      Result Value Range   Prothrombin Time 18.9 (*) 11.6 -  15.2 seconds   INR 1.63 (*) 0.00 - 1.49    RADIOGRAPHIC STUDIES: I have personally reviewed the radiological images as listed and agreed with the findings in the report. X-ray Chest Pa  And Lateral   Ct Super D Chest Wo Contrast  10/23/2013   CLINICAL DATA:  Lung mass. Super D protocol. Pre bronchoscopic biopsy.  EXAM: CT CHEST WITHOUT CONTRAST  TECHNIQUE: Multidetector CT imaging of the chest was performed using thin slice collimation for electromagnetic bronchoscopy planning purposes, without intravenous contrast.  COMPARISON:  CT scan 08/16/2013.  FINDINGS: The chest wall is unremarkable and stable. No supraclavicular or axillary mass or adenopathy. There is a stable left thyroid gland lesion which appears solid. Sonographic evaluation and follow-up is recommended. The bony thorax is intact. No destructive bone lesions or spinal canal compromise. Moderate degenerative changes involving the thoracic spine.  The heart is normal in size. No pericardial effusion. No mediastinal or hilar mass or lymphadenopathy. Stable mildly enlarged right hilar node as demonstrated on the prior examination. Small scattered lymph nodes are stable. The aorta is normal in caliber. Atherosclerotic calcifications noted at the aortic arch. Dense 3 vessel coronary artery calcifications are noted. The esophagus is grossly normal.  Examination of the lung parenchyma demonstrates stable emphysematous changes. Stable spiculated right upper lobe lung mass measuring 4.0 x 3.1 cm. The other right upper lobe lung lesion has enlarged slightly. It measures 16.5 x 15.5 mm and previously measured 15 x 14 mm. No other right lung lesions are identified.  There is a 5.8 mm nodule in the left lower lobe on image number 31. This has enlarged slightly since the prior study when it measured 4.5 mm The vague ground-glass opacity noted on the prior study in the superior segment of the left lower lobe is stable.  No acute pulmonary findings. No pleural effusion.  The upper abdomen is stable. There is mild nodularity and slight enlargement of the right adrenal gland without a discrete nodule/ mass.  IMPRESSION: Stable dominant right upper  lobe pulmonary lesion.  Slight interval enlargement of the smaller right upper lobe pulmonary nodule and a small left lower lobe pulmonary nodule.  Stable vague ground-glass opacity in the left lower lobe.  Stable small right hilar lymph node but no mediastinal adenopathy.   Electronically Signed   By: Loralie Champagne M.D.   On: 10/23/2013 10:29   Dg C-arm Bronchoscopy  10/31/2013   CLINICAL DATA: needed for case   C-ARM BRONCHOSCOPY  Fluoroscopy was utilized by the requesting physician.  No radiographic  interpretation.     ASSESSMENT:  Newly diagnosed non-small cell lung cancer PLAN:  #1 non-small cell lung cancer I have called to the pathology Department to add EGFR and ALK mutation testing. I recommend complete staging with PET CT scan, to be done as an outpatient and MR of the head. The patient would like the MRI to be done here if possible. I will try to honor that request. I will see him back in my office in 2 weeks to discuss about treatment options. If EGFR or ALK mutation came back positive, we have specific drug options available as a form of targeted therapy. The patient agreed.  #2 COPD The patient is currently on treatment for this. #3 anemia This is likely anemia of chronic disease. We will observe for now.  I will sign off. I gave him appointment card to see me on 11/21/2013 at 1 pm.  All questions were answered. The patient knows  to call the clinic with any problems, questions or concerns.    Leyda Vanderwerf, MD @T @ 4:46 PM

## 2013-11-06 NOTE — Progress Notes (Signed)
Results of patient's EKG and chest x-ray called in to NP Tonny Bollman order.- Hulda Marin RN

## 2013-11-06 NOTE — Progress Notes (Signed)
Dr. Sherene Sires was notified re; patient's arrival to unit, he said PA will check patient.No orders at this time.Hulda Marin RN

## 2013-11-06 NOTE — Progress Notes (Signed)
ANTICOAGULATION CONSULT NOTE - Initial Consult  Pharmacy Consult for Warfarin  Indication: atrial fibrillation   No Known Allergies  Patient Measurements: Height: 5\' 8"  (172.7 cm) Weight: 175 lb 8 oz (79.606 kg) IBW/kg (Calculated) : 68.4   Vital Signs: Temp: 97.6 F (36.4 C) (11/25 1223) Temp src: Oral (11/25 1223) BP: 113/74 mmHg (11/25 1223) Pulse Rate: 86 (11/25 1223)  Labs: No results found for this basename: HGB, HCT, PLT, APTT, LABPROT, INR, HEPARINUNFRC, CREATININE, CKTOTAL, CKMB, TROPONINI,  in the last 72 hours  Estimated Creatinine Clearance: 58.4 ml/min (by C-G formula based on Cr of 1.09).   Medical History: Past Medical History  Diagnosis Date  . Hyperlipidemia   . Type II or unspecified type diabetes mellitus without mention of complication, not stated as uncontrolled   . Chronic airway obstruction, not elsewhere classified     HFA 25-50% 07-16-2010> 100% 01-28-2011; PFT's 08-13-10 FEV1 .93(34%) ratio 37 with 23% better p B2 DLCO 69%  . Shortness of breath   . Hypertension   . Arthritis     in hands  . Dysrhythmia     Atrial Fibrillation; takes Cardizem    Medications:  Scheduled:  . albuterol  2.5 mg Nebulization Q6H  . aspirin EC  81 mg Oral Daily  . atorvastatin  10 mg Oral QHS  . budesonide-formoterol  2 puff Inhalation BID  . diltiazem  60 mg Oral BID  . [START ON 11/07/2013] doxazosin  2 mg Oral Daily  . heparin  5,000 Units Subcutaneous Q8H  . ipratropium  0.5 mg Nebulization Q6H  . methylPREDNISolone (SOLU-MEDROL) injection  40 mg Intravenous Q12H  . pantoprazole  40 mg Oral Daily   Infusions:  . sodium chloride     PRN: acetaminophen, acetaminophen, albuterol  Assessment:  72 yo M admitted with AECOPD, recent diagnosis of NSCLC c/w adenocarcinoma.  Patient is on chronic anticoagulation with warfarin for atrial fibrillation  Home warfarin dose = 5 mg daily; Last dose reported taken on 11/24 prior to admission.  INR on admission is below  goal, 1.63 (goal 2-3).  CBC okay   Warfarin ordered to be dosed per pharmacy  Goal of Therapy:  INR 2-3 Monitor platelets by anticoagulation protocol: Yes   Plan:   Will discontinue SQ heparin admission order as patient is on warfarin  Warfarin 7.5 mg tonight, will give slightly higher than home dose given low INR  Daily PT/INR  Monitor CBC Daily    Tamarion Haymond, Loma Messing PharmD Pager #: [phone] 1:32 PM 11/06/2013

## 2013-11-06 NOTE — Progress Notes (Signed)
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 male  with a history of COPD. He was noted to have an enlarging lung nodule. On 08/16/2013 he underwent a CT of the chest which showed a right upper lobe mass and right hilar adenopathy as well as a nodule in the left lower lung worrisome for lung cancer. On 08/21/2013 a transbronchial needle biopsy was nondiagnostic. In October he was referred to Dr. Delton Coombes for repeat biopsy. He had that performed on 10/31/2013 and it was positive for adenocarcinoma in the right upper lobe mass. A super D. chest CT performed before his biopsy on 10/23/2013 showed an increase in the size of the right upper lobe lung lesion as well as the right upper lobe mass and increase in size of the left lower lobe nodules. No pleural effusion was noted. He has not had an MRI of the brain. He has not had a PET scan. He was recently admitted for increasing cough and shortness of breath. We were consulted for evaluation and treatment while he was an inpatient. He has been evaluated by medical oncology. He has an outpatient appointments December 10. He had a brain MRI this evening which was negative for metastatic disease. He states his breathing is better as long as she doesn't move around much. He is very motivated to get treatment so that he can take care of his wife who is a patient of Dr. Clelia Croft for a renal cell cancer. He denies any hemoptysis. Denies any headache or bone pain.  PREVIOUS RADIATION THERAPY: No  PAST MEDICAL HISTORY:  has a past medical history of Hyperlipidemia; Type II or unspecified type diabetes mellitus without mention of complication, not stated as uncontrolled; Chronic  airway obstruction, not elsewhere classified; Shortness of breath; Hypertension; Arthritis; Dysrhythmia; Lung cancer (11/06/2013); and Lung cancer.    PAST SURGICAL HISTORY: Past Surgical History  Procedure Laterality Date  . Video bronchoscopy Bilateral 08/21/2013    Procedure: VIDEO BRONCHOSCOPY WITH FLUORO;  Surgeon: Nyoka Cowden, MD;  Location: [facility];  Service: Cardiopulmonary;  Laterality: Bilateral;  . 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 Cholecystectomy  1995  . Eye surgery Bilateral     cataracts  . Video bronchoscopy with endobronchial navigation N/A 10/31/2013    Procedure: VIDEO BRONCHOSCOPY WITH ENDOBRONCHIAL NAVIGATION;  Surgeon: Leslye Peer, MD;  Location: [facility] OR;  Service: Thoracic;  Laterality: N/A;  . Video bronchoscopy with endobronchial ultrasound N/A 10/31/2013    Procedure: VIDEO BRONCHOSCOPY WITH ENDOBRONCHIAL ULTRASOUND;  Surgeon: Leslye Peer, MD;  Location: [facility] OR;  Service: Thoracic;  Laterality: N/A;    FAMILY HISTORY:  Family History  Problem Relation Age of Onset  . Diabetes Father   . Diabetes Brother   . Diabetes Sister   . Lung cancer Sister   . Liver disease Sister   . Lung cancer Daughter   . Diabetes Daughter   . Hypertension Daughter   . Hyperlipidemia Daughter   . Heart murmur Daughter   . Hyperlipidemia Daughter   . Hypertension Daughter   . Cancer Daughter     lung cancer  . Cancer  Sister     lung cancer    SOCIAL HISTORY:  History  Substance Use Topics  . Smoking status: Former Smoker -- 1.50 packs/day for 40 years    Types: Cigarettes    Quit date: 12/13/2002  . Smokeless tobacco: Current User    Types: Chew  . Alcohol Use: No    ALLERGIES: Review of patient's allergies indicates no known allergies.  MEDICATIONS:  Current Facility-Administered Medications  Medication Dose Route Frequency Provider Last Rate Last Dose  . 0.9 %  sodium chloride infusion   Intravenous  Continuous Jeanella Craze, NP 10 mL/hr at 11/06/13 1405 10 mL/hr at 11/06/13 1405  . acetaminophen (TYLENOL) tablet 650 mg  650 mg Oral Q6H PRN Jeanella Craze, NP       Or  . acetaminophen (TYLENOL) suppository 650 mg  650 mg Rectal Q6H PRN Jeanella Craze, NP      . albuterol (PROVENTIL) (5 MG/ML) 0.5% nebulizer solution 2.5 mg  2.5 mg Nebulization Q6H Jeanella Craze, NP   2.5 mg at 11/06/13 1448  . albuterol (PROVENTIL) (5 MG/ML) 0.5% nebulizer solution 2.5 mg  2.5 mg Nebulization Q3H PRN Jeanella Craze, NP      . aspirin EC tablet 81 mg  81 mg Oral Daily Jeanella Craze, NP   81 mg at 11/06/13 1405  . atorvastatin (LIPITOR) tablet 10 mg  10 mg Oral QHS Nyoka Cowden, MD      . budesonide-formoterol (SYMBICORT) 160-4.5 MCG/ACT inhaler 2 puff  2 puff Inhalation BID Jeanella Craze, NP      . diltiazem (CARDIZEM) tablet 60 mg  60 mg Oral BID Jeanella Craze, NP      . Melene Muller ON 11/07/2013] doxazosin (CARDURA) tablet 2 mg  2 mg Oral Daily Jeanella Craze, NP      . insulin aspart (novoLOG) injection 0-15 Units  0-15 Units Subcutaneous Q4H Jeanella Craze, NP   3 Units at 11/06/13 1651  . ipratropium (ATROVENT) nebulizer solution 0.5 mg  0.5 mg Nebulization Q6H Jeanella Craze, NP   0.5 mg at 11/06/13 1448  . methylPREDNISolone sodium succinate (SOLU-MEDROL) 40 mg/mL injection 40 mg  40 mg Intravenous Q12H Jeanella Craze, NP   40 mg at 11/06/13 1426  . pantoprazole (PROTONIX) EC tablet 40 mg  40 mg Oral Daily Jeanella Craze, NP   40 mg at 11/06/13 1426  . warfarin (COUMADIN) tablet 7.5 mg  7.5 mg Oral [address] [place], [facility]      . Warfarin - Pharmacist Dosing Inpatient   Does not apply q1800 Loma Messing [place], [facility]        REVIEW OF SYSTEMS:  A 15 point review of systems is documented in the electronic medical record. This was obtained by the nursing staff. However, I reviewed this with the patient to discuss relevant findings and make appropriate changes.  Pertinent items are noted  in HPI.  PHYSICAL EXAM:  Filed Vitals:   11/06/13 1223  BP: 113/74  Pulse: 86  Temp: 97.6 F (36.4 C)  Resp: 20  .175 lb 8 oz (79.606 kg). He is a pleasant elderly male who appears his stated age. His lungs are clear to auscultation bilaterally. He has a nasal cannula in place. He is alert and oriented x3.  LABORATORY DATA:  Lab Results  Component Value Date   WBC 6.8 11/06/2013   HGB 12.3* 11/06/2013   HCT 37.4* 11/06/2013   MCV 93.3 11/06/2013  PLT 236 11/06/2013   Lab Results  Component Value Date   NA 140 11/06/2013   K 4.2 11/06/2013   CL 101 11/06/2013   CO2 28 11/06/2013   Lab Results  Component Value Date   ALT 7 11/06/2013   AST 11 11/06/2013   ALKPHOS 74 11/06/2013   BILITOT 0.2* 11/06/2013     RADIOGRAPHY: X-ray Chest Pa And Lateral   11/06/2013   CLINICAL DATA:  Shortness of breath  EXAM: CHEST  2 VIEW  COMPARISON:  10/31/2013  FINDINGS: Cardiac shadow is stable. Persistent masses are noted in the right upper lobe similar to that seen on prior CT and plain film examination. No new focal infiltrate or sizable effusion is seen. No acute bony abnormality is noted.  IMPRESSION: Persistent right upper lobe masses.   Electronically Signed   By: Alcide Clever M.D.   On: 11/06/2013 14:29   Dg Chest 2 View Within Previous 72 Hours.  Films Obtained On Friday Are Acceptable For Monday And Tuesday Cases  10/31/2013   CLINICAL DATA:  Right-sided chest pain, shortness of breath, known lung masses  EXAM: CHEST  2 VIEW  COMPARISON:  CT scan 10/23/2013  FINDINGS: Two lung masses in the right upper lobe identified, the more peripheral measuring 21 mm and the more central, in the suprahilar area, measuring 3.7 cm. Hyperinflation suggests COPD. No infiltrate, effusion, or consolidation.  IMPRESSION: Two known right lung mass is suspicious for malignancy.   Electronically Signed   By: Esperanza Heir M.D.   On: 10/31/2013 08:41   Dg Chest Port 1 View  10/31/2013   CLINICAL DATA:   Postop bronchoscopy with biopsy  EXAM: PORTABLE CHEST - 1 VIEW  COMPARISON:  10/31/2013.  FINDINGS: Right upper lobe mass lesions are unchanged. No pneumothorax post biopsy.  Mild bibasilar atelectasis hypoventilation.  IMPRESSION: Negative for pneumothorax post right upper lobe biopsy.   Electronically Signed   By: Marlan Palau M.D.   On: 10/31/2013 10:28   Ct Super D Chest Wo Contrast  10/23/2013   CLINICAL DATA:  Lung mass. Super D protocol. Pre bronchoscopic biopsy.  EXAM: CT CHEST WITHOUT CONTRAST  TECHNIQUE: Multidetector CT imaging of the chest was performed using thin slice collimation for electromagnetic bronchoscopy planning purposes, without intravenous contrast.  COMPARISON:  CT scan 08/16/2013.  FINDINGS: The chest wall is unremarkable and stable. No supraclavicular or axillary mass or adenopathy. There is a stable left thyroid gland lesion which appears solid. Sonographic evaluation and follow-up is recommended. The bony thorax is intact. No destructive bone lesions or spinal canal compromise. Moderate degenerative changes involving the thoracic spine.  The heart is normal in size. No pericardial effusion. No mediastinal or hilar mass or lymphadenopathy. Stable mildly enlarged right hilar node as demonstrated on the prior examination. Small scattered lymph nodes are stable. The aorta is normal in caliber. Atherosclerotic calcifications noted at the aortic arch. Dense 3 vessel coronary artery calcifications are noted. The esophagus is grossly normal.  Examination of the lung parenchyma demonstrates stable emphysematous changes. Stable spiculated right upper lobe lung mass measuring 4.0 x 3.1 cm. The other right upper lobe lung lesion has enlarged slightly. It measures 16.5 x 15.5 mm and previously measured 15 x 14 mm. No other right lung lesions are identified.  There is a 5.8 mm nodule in the left lower lobe on image number 31. This has enlarged slightly since the prior study when it measured 4.5  mm The vague ground-glass opacity noted on  the prior study in the superior segment of the left lower lobe is stable.  No acute pulmonary findings. No pleural effusion.  The upper abdomen is stable. There is mild nodularity and slight enlargement of the right adrenal gland without a discrete nodule/ mass.  IMPRESSION: Stable dominant right upper lobe pulmonary lesion.  Slight interval enlargement of the smaller right upper lobe pulmonary nodule and a small left lower lobe pulmonary nodule.  Stable vague ground-glass opacity in the left lower lobe.  Stable small right hilar lymph node but no mediastinal adenopathy.   Electronically Signed   By: Loralie Champagne M.D.   On: 10/23/2013 10:29   Dg C-arm Bronchoscopy  10/31/2013   CLINICAL DATA: needed for case   C-ARM BRONCHOSCOPY  Fluoroscopy was utilized by the requesting physician.  No radiographic  interpretation.       IMPRESSION: Stage III (2 lesions and hilar adenopathy on the right) vs.  stage IV (contralateral lesion) adenocarcinoma of the lung  PLAN: I discussed the plan with Mr. Danae Orleans and his family. We discussed concurrent chemoradiation if this left nodule is in fact not malignancy. We discussed chemotherapy alone if he has done to be metastatic versus chemoradiation to the right and SBRT to the left. At this point a PET would really be helpful to further delineate his disease. We will get that scheduled as an outpatient and proceed with followup. We discussed in broad terms the process of simulation and the placement tattoos. We discussed daily treatments as an outpatient. I will contact my office to make a followup appointment with him. I spent 30 minutes  face to face with the patient and more than 50% of that time was spent in counseling and/or coordination of care.   ------------------------------------------------  Lurline Hare, MD

## 2013-11-06 NOTE — Progress Notes (Signed)
Patient arrived to unit at this time.Alert and orientedx4.denies pain.Placed patient comfortabbly in bed.- Hulda Marin RN

## 2013-11-07 ENCOUNTER — Telehealth: Payer: Self-pay | Admitting: Hematology and Oncology

## 2013-11-07 ENCOUNTER — Other Ambulatory Visit: Payer: Self-pay | Admitting: Internal Medicine

## 2013-11-07 ENCOUNTER — Telehealth: Payer: Self-pay | Admitting: *Deleted

## 2013-11-07 DIAGNOSIS — J441 Chronic obstructive pulmonary disease with (acute) exacerbation: Principal | ICD-10-CM

## 2013-11-07 DIAGNOSIS — C3490 Malignant neoplasm of unspecified part of unspecified bronchus or lung: Secondary | ICD-10-CM

## 2013-11-07 DIAGNOSIS — R0609 Other forms of dyspnea: Secondary | ICD-10-CM

## 2013-11-07 LAB — PROTIME-INR: INR: 1.96 — ABNORMAL HIGH (ref 0.00–1.49)

## 2013-11-07 LAB — CBC
HCT: 34.8 % — ABNORMAL LOW (ref 39.0–52.0)
Hemoglobin: 11.8 g/dL — ABNORMAL LOW (ref 13.0–17.0)
MCH: 30.8 pg (ref 26.0–34.0)
RBC: 3.83 MIL/uL — ABNORMAL LOW (ref 4.22–5.81)
RDW: 13.1 % (ref 11.5–15.5)
WBC: 4.6 10*3/uL (ref 4.0–10.5)

## 2013-11-07 LAB — BASIC METABOLIC PANEL
Chloride: 102 mEq/L (ref 96–112)
Creatinine, Ser: 1.03 mg/dL (ref 0.50–1.35)
GFR calc Af Amer: 81 mL/min — ABNORMAL LOW (ref >90)
Potassium: 4 mEq/L (ref 3.5–5.1)
Sodium: 138 mEq/L (ref 135–145)

## 2013-11-07 LAB — GLUCOSE, CAPILLARY
Glucose-Capillary: 183 mg/dL — ABNORMAL HIGH (ref 70–99)
Glucose-Capillary: 296 mg/dL — ABNORMAL HIGH (ref 70–99)

## 2013-11-07 MED ORDER — ALBUTEROL SULFATE (2.5 MG/3ML) 0.083% IN NEBU: 2.5000 mg | INHALATION_SOLUTION | Freq: Four times a day (QID) | RESPIRATORY_TRACT | Status: DC | PRN

## 2013-11-07 MED ORDER — PREDNISONE 10 MG PO TABS: ORAL_TABLET | ORAL | Status: DC

## 2013-11-07 MED ORDER — WARFARIN SODIUM 5 MG PO TABS
5.0000 mg | ORAL_TABLET | Freq: Once | ORAL | Status: DC
  Filled 2013-11-07: qty 1

## 2013-11-07 NOTE — Progress Notes (Signed)
Patient discharged to home with son via wheelchair, discharge instructions reviewed with patient who verbalized understanding. New RX given to patient.

## 2013-11-07 NOTE — Telephone Encounter (Signed)
s.w. pt daughter and advised DEC appt...ok and aware

## 2013-11-07 NOTE — Telephone Encounter (Signed)
CALLED PATIENT TO INFORM OF APPT. ON 11-28-13- ARRIVAL TIME- 3:30 PM, SPOKE WITH PATIENT'S DAUGHTER KAREN STEWART AND SHE IS AWARE OF THIS APPT.

## 2013-11-07 NOTE — Progress Notes (Signed)
ANTICOAGULATION CONSULT NOTE - Initial Consult  Pharmacy Consult for Warfarin  Indication: atrial fibrillation   No Known Allergies  Patient Measurements: Height: 5\' 8"  (172.7 cm) Weight: 173 lb 15.1 oz (78.9 kg) IBW/kg (Calculated) : 68.4   Vital Signs: Temp: 98.2 F (36.8 C) (11/26 0547) Temp src: Oral (11/26 0547) BP: 104/73 mmHg (11/26 0547) Pulse Rate: 70 (11/26 0547)  Labs:  Recent Labs  11/06/13 1314 11/06/13 1358 11/07/13 0320  HGB 12.3*  --  11.8*  HCT 37.4*  --  34.8*  PLT 236  --  253  LABPROT  --  18.9* 21.7*  INR  --  1.63* 1.96*  CREATININE 1.08  --  1.03    Estimated Creatinine Clearance: 61.8 ml/min (by C-G formula based on Cr of 1.03).   Medical History: Past Medical History  Diagnosis Date  . Hyperlipidemia   . Type II or unspecified type diabetes mellitus without mention of complication, not stated as uncontrolled   . Chronic airway obstruction, not elsewhere classified     HFA 25-50% 07-16-2010> 100% 01-28-2011; PFT's 08-13-10 FEV1 .93(34%) ratio 37 with 23% better p B2 DLCO 69%  . Shortness of breath   . Hypertension   . Arthritis     in hands  . Dysrhythmia     Atrial Fibrillation; takes Cardizem  . Lung cancer 11/06/2013  . Lung cancer     Medications:  Scheduled:  . albuterol  2.5 mg Nebulization Q6H  . aspirin EC  81 mg Oral Daily  . atorvastatin  10 mg Oral QHS  . budesonide-formoterol  2 puff Inhalation BID  . diltiazem  60 mg Oral BID  . doxazosin  2 mg Oral Daily  . insulin aspart  0-15 Units Subcutaneous Q4H  . ipratropium  0.5 mg Nebulization Q6H  . methylPREDNISolone (SOLU-MEDROL) injection  40 mg Intravenous Q12H  . pantoprazole  40 mg Oral Daily  . Warfarin - Pharmacist Dosing Inpatient   Does not apply q1800   Infusions:  . sodium chloride 10 mL/hr (11/06/13 1405)   PRN: acetaminophen, acetaminophen, albuterol  Assessment:  72 yo M admitted with AECOPD, recent diagnosis of NSCLC.  Patient is on chronic  anticoagulation with warfarin for atrial fibrillation  Home warfarin dose = 5 mg daily; Last dose reported taken on 11/24 prior to admission.  INR on admission is below goal, 1.63 (goal 2-3).  INR today (1.96) Nearly at goal after a slightly higher dose last night, will resume with home dosing tonight.   CBC okay  No bleeding/issues   Goal of Therapy:  INR 2-3 Monitor platelets by anticoagulation protocol: Yes   Plan:   Warfarin 5 mg tonight   Daily PT/INR  Monitor CBC Daily    Aneliz Carbary, Loma Messing PharmD Pager #: [phone] 7:48 AM 11/07/2013

## 2013-11-07 NOTE — Care Management Note (Signed)
   CARE MANAGEMENT NOTE 11/07/2013  Patient:  Brent Wade, Brent Wade   Account Number:  [id number]  Date Initiated:  11/07/2013  Documentation initiated by:  Estefany Goebel  Subjective/Objective Assessment:   72 yo male admitted with AECOPD. Hx: non-small cell carcinoma c/w adenocarcinoma. Pulmonologist; Dr.Wert     Action/Plan:   Home when stable   Anticipated DC Date:     Anticipated DC Plan:  HOME W HOME HEALTH SERVICES      DC Planning Services  CM consult      Choice offered to / List presented to:  NA   DME arranged  NA      DME agency  NA     HH arranged  NA      HH agency  NA   Status of service:  In process, will continue to follow Medicare Important Message given?   (If response is "NO", the following Medicare IM given date fields will be blank) Date Medicare IM given:   Date Additional Medicare IM given:    Discharge Disposition:    Per UR Regulation:  Reviewed for med. necessity/level of care/duration of stay  If discussed at Long Length of Stay Meetings, dates discussed:    Comments:  11/07/13 1217 Roxy Manns Shaniqua Guillot,RN,MSN [phone] Chart reviewed for utilization of services. Cm to consult pt concerning home health RN for dx management.

## 2013-11-07 NOTE — Discharge Summary (Signed)
Physician Discharge Summary  Patient ID: Brent Wade MRN: [id number] DOB/AGE: 74-Dec-1942 74 y.o.  Admit date: 11/06/2013 Discharge date: 11/07/2013    Discharge Diagnoses:  Acute Exacerbation of COPD Non-Small Cell Carcinoma / Adenocarcinoma of RUL Former Smoker HTN HLD Atrial Fibrillation on Coumadin Diabetes Mellitus                                                                      DISCHARGE PLAN BY DIAGNOSIS     Acute Exacerbation of COPD Non-Small Cell Carcinoma / Adenocarcinoma of RUL Former Smoker  Discharge Plan: -prednisone taper to off -continue previous home COPD regimen:  Symbicort, albuterol, Tudorza -follow up with ONC / Radiation ONC for RUL adenocarcinoma -follow up with Dr.Eydan Chianese as scheduled  HTN HLD Atrial Fibrillation on Coumadin  Discharge Plan: -continue zocor -continue coumadin -cardizem BID  DM  Discharge Plan: -continue metformin               DISCHARGE SUMMARY   Brent Wade is a 72 y.o. y/o male, former smoker, with a PMH of  DM, HLD, Afib on coumadin, COPD (FEV1 0.93L) who was admitted 11/25 with AECOPD. At baseline he is on Tudorza bid, Symbicort bit, SABA once a day. Patient called the office on 11/25  complaining of worsening shortness of breath to the point of dyspnea at rest. Has had difficulties with daily activities due to dyspnea. Morning cough with increased yellow sputum production but improves as the day goes on. Reports no relief with PRN albuterol.  He has known RUL pulmonary mass.  Needle biopsies were performed on 08/21/13 but the cytology was negative. Recent CT in Sept 2014 with RUL mass / nodule & hilar lymphadenopathy leading to ENB 11/19 with non-small cell carcinoma c/w adenocarcinoma.   Patient was admitted and placed on IV steroids, nebulized bronchodilators, oxygen and pulmonary hygiene.  He was evaluated by Oncology & Radiation Oncology while inpatient for arrangement of further adenocarcinoma treatment.   Patient made quick improvement with Rx.  Will complete prednisone taper at discharge.  He was evaluated for oxygen needs prior to discharge and maintained saturations of 95% while on RA with ambulation.     SIGNIFICANT DIAGNOSTIC STUDIES 08/16/13 - CT Chest>>Aggressive appearing right upper lobe pulmonary mass and nodule with a right hilar lymphadenopathy  11/19 - ENB >>non-small cell carcinoma c/w adenocarcinoma. Bronchial washings w adenocarcinoma ....................................................................................................Marland Kitchen  11/25 - Admit with SOB / AECOPD. ONC/RTevaluation while inpatient.    CONSULTS Radiation Oncology Oncology   Discharge Exam: Gen: Pleasant, well-nourished, in no distress, normal affect  ENT: No lesions, mouth clear, oropharynx clear, no postnasal drip  Neck: No JVD, no TMG, no carotid bruits  Lungs: No use of accessory muscles, few soft exp wheezes  Cardiovascular: RRR, heart sounds normal, no murmur or gallops, no peripheral edema  Musculoskeletal: No deformities, no cyanosis or clubbing  Neuro: alert, non focal  Skin: Warm, no lesions or rashes   Filed Vitals:   11/07/13 0547 11/07/13 0729 11/07/13 1140 11/07/13 1143  BP: 104/73     Pulse: 70     Temp: 98.2 F (36.8 C)     TempSrc: Oral     Resp: 20     Height:  Weight: 173 lb 15.1 oz (78.9 kg)     SpO2: 98% 96% 96% 95%     Discharge Labs  BMET  Recent Labs Lab 11/06/13 1314 11/07/13 0320  NA 140 138  K 4.2 4.0  CL 101 102  CO2 28 25  GLUCOSE 199* 192*  BUN 10 12  CREATININE 1.08 1.03  CALCIUM 9.4 9.2   CBC  Recent Labs Lab 11/06/13 1314 11/07/13 0320  HGB 12.3* 11.8*  HCT 37.4* 34.8*  WBC 6.8 4.6  PLT 236 253   Anti-Coagulation  Recent Labs Lab 11/06/13 1358 11/07/13 0320  INR 1.63* 1.96*        Discharge Orders   Future Appointments Provider Department Dept Phone   11/15/2013 10:15 AM Marykay Lex, MD [facility]  [phone]   11/21/2013 11:15 AM Leslye Peer, MD [facility] [phone]   11/21/2013 1:00 PM Artis Delay, MD [facility] MEDICAL ONCOLOGY [phone]   11/23/2013 8:00 AM [facility] [phone]   Pt should arrive15 minutes prior to scheduled appt time. Please inform patient that exam will take a minimum of 1 1/2 hours. Patient to be NPO 6 hours prior to exam  and should not take any insulin the day of exam.   11/28/2013 3:30 PM [facility] Nurse [facility] RADIATION ONCOLOGY [phone]   11/28/2013 4:00 PM Lurline Hare, MD [facility] RADIATION ONCOLOGY [phone]   Future Orders Complete By Expires   Call MD for:  difficulty breathing, headache or visual disturbances  As directed    Call MD for:  extreme fatigue  As directed    Call MD for:  persistant dizziness or light-headedness  As directed    Call MD for:  severe uncontrolled pain  As directed    Call MD for:  temperature >100.4  As directed    Diet Carb Modified  As directed    Discharge instructions  As directed    Comments:     Complete Prednisone taper until complete.  Next dose due 11/27.  Follow up as scheduled.   Increase activity slowly  As directed         Medication List         albuterol 108 (90 BASE) MCG/ACT inhaler  Commonly known as:  PROVENTIL HFA;VENTOLIN HFA  Inhale 2 puffs into the lungs every 6 (six) hours as needed for wheezing or shortness of breath.     budesonide-formoterol 160-4.5 MCG/ACT inhaler  Commonly known as:  SYMBICORT  Inhale 2 puffs into the lungs 2 (two) times daily.     diltiazem 60 MG tablet  Commonly known as:  CARDIZEM  Take 1 tablet (60 mg total) by mouth 2 (two) times daily.     doxazosin 2 MG tablet  Commonly known as:  CARDURA  Take 2 mg by mouth daily.     metFORMIN 500 MG tablet  Commonly known as:  GLUCOPHAGE  Take 500 mg by mouth 2 (two) times daily with  a meal.     predniSONE 10 MG tablet  Commonly known as:  DELTASONE  4 tabs for 2 days, then 3 tabs for 2 days, then 2 tabs for 2 days, then 1 tab for 2 days and then stop.     simvastatin 40 MG tablet  Commonly known as:  ZOCOR  Take 0.5 tablets (20 mg total) by mouth at bedtime.     TUDORZA PRESSAIR 400 MCG/ACT Aepb  Generic drug:  Aclidinium Bromide  Inhale 1 puff into the lungs 2 (two) times daily.     warfarin 5 MG tablet  Commonly known as:  COUMADIN  Take 1 tablet (5 mg total) by mouth daily.        Disposition: Home.  No home health needs identified prior to discharge.    Discharged Condition: SHAMOND SKELTON has met maximum benefit of inpatient care and is medically stable and cleared for discharge.  Patient is pending follow up as above.      Time spent on disposition:  Greater than 35 minutes.   Signed: Canary Brim, NP-C [facility] Pgr: [phone] Office: [phone]    I have personally obtained history, examined patient, evaluated and interpreted laboratory and imaging results, reviewed medical records, formulated assessment / plan and placed orders.    He also has neb but no albuterol > ordered.  Sandrea Hughs, MD Pulmonary and Critical Care Medicine [facility] Cell [phone] After 5:30 PM or weekends, call [phone]

## 2013-11-12 HISTORY — PX: NM MYOVIEW LTD: HXRAD82

## 2013-11-13 NOTE — Progress Notes (Signed)
Thanks for seeing him.  Any idea why the stress test was cancelled?  Marykay Lex, MD

## 2013-11-14 ENCOUNTER — Telehealth: Payer: Self-pay | Admitting: Hematology and Oncology

## 2013-11-14 NOTE — Telephone Encounter (Signed)
due to pet scan not being until 12/12 f/u moved to 12/15. ok per NG. s/w pt he is aware.

## 2013-11-15 ENCOUNTER — Ambulatory Visit (INDEPENDENT_AMBULATORY_CARE_PROVIDER_SITE_OTHER): Payer: Medicare Other | Admitting: Cardiology

## 2013-11-15 ENCOUNTER — Encounter: Payer: Self-pay | Admitting: Cardiology

## 2013-11-15 ENCOUNTER — Ambulatory Visit (INDEPENDENT_AMBULATORY_CARE_PROVIDER_SITE_OTHER): Payer: Medicare Other | Admitting: Pharmacist Clinician (PhC)/ Clinical Pharmacy Specialist

## 2013-11-15 VITALS — BP 116/84 | HR 78 | Ht 68.0 in | Wt 177.6 lb

## 2013-11-15 DIAGNOSIS — Z7901 Long term (current) use of anticoagulants: Secondary | ICD-10-CM | POA: Insufficient documentation

## 2013-11-15 DIAGNOSIS — I429 Cardiomyopathy, unspecified: Secondary | ICD-10-CM

## 2013-11-15 DIAGNOSIS — I4891 Unspecified atrial fibrillation: Secondary | ICD-10-CM

## 2013-11-15 DIAGNOSIS — I10 Essential (primary) hypertension: Secondary | ICD-10-CM

## 2013-11-15 DIAGNOSIS — E119 Type 2 diabetes mellitus without complications: Secondary | ICD-10-CM

## 2013-11-15 DIAGNOSIS — E785 Hyperlipidemia, unspecified: Secondary | ICD-10-CM

## 2013-11-15 DIAGNOSIS — R9439 Abnormal result of other cardiovascular function study: Secondary | ICD-10-CM

## 2013-11-15 DIAGNOSIS — R931 Abnormal findings on diagnostic imaging of heart and coronary circulation: Secondary | ICD-10-CM

## 2013-11-15 LAB — POCT INR: INR: 3.1

## 2013-11-15 NOTE — Patient Instructions (Signed)
Your Echocardiogram did show a mild to moderately reduced overall function.  The cause of this is unclear, but the stress test would be helpful.  I will reschedule your stress test.  If there is a significant abnormality, I will call you back in to discuss results. If the result is favorable, I will just see you back in 3 months   HARDING,DAVID W, MD

## 2013-11-15 NOTE — Progress Notes (Signed)
May and  PATIENT: Brent Wade MRN: [id number]  DOB: 1941-11-01   DOV:11/17/2013 PCP: Thora Lance, MD  Clinic Note: Chief Complaint  Patient presents with  . Follow-up    did not do myoview, echo ;post hospital -bronchoscopy and post w/W[facility]; no chest pain , sob always, no edema now   HPI: Brent Wade is a 72 y.o.  male with a concave medical history as noted below who presents today for followup of atrial fibrillation and an abnormal echocardiogram. He had an ECG showing atrial fibrillation while undergoing preprocedure evaluation for bronchoscopy/microscopic guided biopsy of a lung mass. When I saw him on November 17, he was not symptomatic with atrial fibrillation, and essentially denied knowing about it. We started him on warfarin once his biopsy was complete. I started him on low dose of diltiazem for rate control. He had an echocardiogram done that showed mild to moderately reduced EF. The plan was for him to undergo a stress test to evaluate for any ischemic etiology of his atrial fibrillation as well as the reduced ejection fraction. Unfortunately the date he was scheduled to have his stress test, was just after his biopsy the previous day. He did not feel up to put himself through another study. He therefore asked to reschedule. I do not therefore have the results of that study..  Interval History: Today he is in somewhat of a somber mood along with his daughter. He is just told the results of his biopsy did confirm lung cancer, he isn't having any details as far as stage. He knows he is to see the oncologist next week. He says he always has baseline shortness of breath with morning productive cough, but other than that he really denies any sensation of palpitations, rapid heart beats, irregular heartbeats. He denies any lightheadedness, dizziness or wooziness. No TIA or amaurosis fugax symptoms. No chest pain with rest or exertion. He does have dyspnea with exertion. No PND,  orthopnea or edema. Now he is on warfarin he denies any melena, hematochezia or hematuria.  Past Medical History  Diagnosis Date  . Hyperlipidemia   . Type II or unspecified type diabetes mellitus without mention of complication, not stated as uncontrolled   . Chronic airway obstruction, not elsewhere classified     HFA 25-50% 07-16-2010> 100% 01-28-2011; PFT's 08-13-10 FEV1 .93(34%) ratio 37 with 23% better p B2 DLCO 69%  . Shortness of breath   . Hypertension   . Arthritis     in hands  . Dysrhythmia     Atrial Fibrillation; takes Cardizem  . Lung cancer 11/06/2013    Diagnosed during bronchoscopy    Prior Cardiac Evaluation and Past Surgical History: Past Surgical History  Procedure Laterality Date  . Video bronchoscopy Bilateral 08/21/2013    Procedure: VIDEO BRONCHOSCOPY WITH FLUORO;  Surgeon: Nyoka Cowden, MD;  Location: [facility];  Service: Cardiopulmonary;  Laterality: Bilateral;  . 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 Cholecystectomy  1995  . Eye surgery Bilateral     cataracts  . Video bronchoscopy with endobronchial navigation N/A 10/31/2013    Procedure: VIDEO BRONCHOSCOPY WITH ENDOBRONCHIAL NAVIGATION;  Surgeon: Leslye Peer, MD;  Location: [facility] OR;  Service: Thoracic;  Laterality: N/A;  . Video bronchoscopy with endobronchial ultrasound N/A 10/31/2013    Procedure: VIDEO BRONCHOSCOPY WITH ENDOBRONCHIAL ULTRASOUND;  Surgeon: Leslye Peer, MD;  Location: [facility] OR;  Service: Thoracic;  Laterality: N/A;  .  Transthoracic echocardiogram  10/29/2013    Mild-moderately reduced EF, 40-45%. Diffuse HK. Mild MR. Mild LA/RA dilation; no determination of elevated pulmonary arterial pressures     No Known Allergies  Current Outpatient Prescriptions  Medication Sig Dispense Refill  . Aclidinium Bromide (TUDORZA PRESSAIR) 400 MCG/ACT AEPB Inhale 1 puff into the lungs 2 (two) times daily.      Marland Kitchen albuterol (PROVENTIL HFA;VENTOLIN HFA) 108 (90  BASE) MCG/ACT inhaler Inhale 2 puffs into the lungs every 6 (six) hours as needed for wheezing or shortness of breath.       Marland Kitchen albuterol (PROVENTIL) (2.5 MG/3ML) 0.083% nebulizer solution Take 3 mLs (2.5 mg total) by nebulization every 6 (six) hours as needed for wheezing or shortness of breath.  75 mL  12  . budesonide-formoterol (SYMBICORT) 160-4.5 MCG/ACT inhaler Inhale 2 puffs into the lungs 2 (two) times daily.        Marland Kitchen diltiazem (CARDIZEM) 60 MG tablet Take 1 tablet (60 mg total) by mouth 2 (two) times daily.  60 tablet  6  . doxazosin (CARDURA) 2 MG tablet Take 2 mg by mouth daily.        . metFORMIN (GLUCOPHAGE) 500 MG tablet Take 500 mg by mouth 2 (two) times daily with a meal.       . simvastatin (ZOCOR) 40 MG tablet Take 0.5 tablets (20 mg total) by mouth at bedtime.  30 tablet  6  . warfarin (COUMADIN) 5 MG tablet Take 1 tablet (5 mg total) by mouth daily.  30 tablet  3   No current facility-administered medications for this visit.    History   Social History Narrative   He has been married for 48 years to his wife Brent Wade.   They have 3 daughters, 2 still alive (Debra died at age 7 of lung cancer) -- 5 grandchildren   Former smoker: Quit 2004; 1.5 pack per day for 40 years; does not drink alcohol or use illicit drugs   Does not get routine exercise.   ROS: A comprehensive Review of Systems - Negative except Symptoms noted in history of present illness.  PHYSICAL EXAM BP 116/84  Pulse 78  Ht 5\' 8"  (1.727 m)  Wt 177 lb 9.6 oz (80.559 kg)  BMI 27.01 kg/m2 General appearance: Alert and oriented X 3, cooperative, appears stated age, no distress and pleasant mood and affect. Well-nourished and well-groomed. Answers questions appropriately.  HEENT: [place]/AT, EOMI, MMM, anicteric sclera; mild arcus sinilus  Neck: no adenopathy, no carotid bruit, no JVD, supple, symmetrical, trachea midline and thyroid not enlarged, symmetric, no tenderness/mass/nodules  Lungs: Mostly CTA B. with mild  end expiratory wheezing bilaterally; nonlabored. No rales or rhonchi.  Heart: normal apical impulse and irregularly irregular rate and rhythm. Tachycardic with no obvious rubs or gallops. There is a soft 1/6 SEM at RUSB.  Abdomen: soft, non-tender; bowel sounds normal; no masses, no organomegaly  Extremities: No clubbing, cyanosis with trace lower extremity edema; Pulses: 2+ and symmetric   [id number] today: Yes Rate: 78 , Rhythm: A. fib with controlled ventricular rate;  nonspecific ST-T abnormalities. No changes.   Recent Labs: Preprocedure labs reviewed in Epic  ASSESSMENT / PLAN: Atrial fibrillation with controlled ventricular response His heart rate is relatively well controlled. I would like his beta blocker, however with his lungs at that using diltiazem is still okay. His EF is now down too low. He is anticoagulated with warfarin, he discussed with Phillips Hay the other options, and they determined that warfarin  be the best choice. If he were to need any additional biopsies, it will be fine for him to stop temporarily. For now since he is not symptomatic I think simply doing rate control is the right option.  We still need to do an ischemic evaluation. Plan: LexiScan Myoview  Secondary cardiomyopathy, unspecified As yet we, we don't really know what the etiology of his cardiomyopathy, reduced  EF is related to. we need to do the ischemic evaluation a Myoview stress test. Since he is not having 2 heart failure symptoms, has not had any anginal symptoms I would not choose to go to heart catheterization unless the Myoview suggests potential ischemia. This is not having any heart failure symptoms, I'm not inclined to start any aggressive treatment of his reduced ejection fraction. With the diltiazem for rate control, he really does not have a lot of blood pressure room. We would consider an ARB in the future if blood pressure would allow. I would also like to consider switching over to  a beta blocker, but would wait until we know the extent of his lung cancer diagnosis.  HYPERLIPIDEMIA He is on a statin that is monitored by his PCP. This just is another potential risk factor for coronary disease, therefore the need to pursue ischemic evaluation.  Long term (current) use of anticoagulants On warfarin, monitored by Phillips Hay, RPH-CPP.    Orders Placed This Encounter  Procedures  . Myocardial Perfusion Imaging    Standing Status: Future     Number of Occurrences:      Standing Expiration Date: 11/15/2014    Order Specific Question:  Where should this test be performed    Answer:  [facility]    Order Specific Question:  Type of stress    Answer:  Lexiscan    Order Specific Question:  Patient weight in lbs    Answer:  177  . EKG 12-Lead   No orders of the defined types were placed in this encounter.    Followup: 3 months, unless Myoview is grossly abnormal.  DAVID W. Herbie Baltimore, M.D., M.S. [facility] [address] [place], [place]  [address]  [phone] Pager # [phone]

## 2013-11-16 ENCOUNTER — Encounter (HOSPITAL_COMMUNITY): Payer: Self-pay

## 2013-11-17 ENCOUNTER — Encounter: Payer: Self-pay | Admitting: Cardiology

## 2013-11-17 DIAGNOSIS — I428 Other cardiomyopathies: Secondary | ICD-10-CM | POA: Insufficient documentation

## 2013-11-17 NOTE — Assessment & Plan Note (Signed)
As yet we, we don't really know what the etiology of his cardiomyopathy, reduced  EF is related to. we need to do the ischemic evaluation a Myoview stress test. Since he is not having 2 heart failure symptoms, has not had any anginal symptoms I would not choose to go to heart catheterization unless the Myoview suggests potential ischemia. This is not having any heart failure symptoms, I'm not inclined to start any aggressive treatment of his reduced ejection fraction. With the diltiazem for rate control, he really does not have a lot of blood pressure room. We would consider an ARB in the future if blood pressure would allow. I would also like to consider switching over to a beta blocker, but would wait until we know the extent of his lung cancer diagnosis.

## 2013-11-17 NOTE — Assessment & Plan Note (Signed)
On warfarin, monitored by Phillips Hay, RPH-CPP.

## 2013-11-17 NOTE — Assessment & Plan Note (Signed)
His heart rate is relatively well controlled. I would like his beta blocker, however with his lungs at that using diltiazem is still okay. His EF is now down too low. He is anticoagulated with warfarin, he discussed with Phillips Hay the other options, and they determined that warfarin be the best choice. If he were to need any additional biopsies, it will be fine for him to stop temporarily. For now since he is not symptomatic I think simply doing rate control is the right option.  We still need to do an ischemic evaluation. Plan: LexiScan Myoview

## 2013-11-17 NOTE — Assessment & Plan Note (Signed)
He is on a statin that is monitored by his PCP. This just is another potential risk factor for coronary disease, therefore the need to pursue ischemic evaluation.

## 2013-11-21 ENCOUNTER — Ambulatory Visit (INDEPENDENT_AMBULATORY_CARE_PROVIDER_SITE_OTHER): Payer: Medicare Other | Admitting: Emergency Medicine

## 2013-11-21 ENCOUNTER — Ambulatory Visit: Payer: Medicare Other | Admitting: Hematology and Oncology

## 2013-11-21 ENCOUNTER — Ambulatory Visit (HOSPITAL_COMMUNITY)
Admission: RE | Admit: 2013-11-21 | Discharge: 2013-11-21 | Disposition: A | Discharge status: Home / Self Care | Payer: Medicare Other | Source: Ambulatory Visit | Attending: Cardiovascular Disease | Admitting: Cardiovascular Disease

## 2013-11-21 ENCOUNTER — Encounter (HOSPITAL_COMMUNITY): Payer: Self-pay

## 2013-11-21 ENCOUNTER — Ambulatory Visit (INDEPENDENT_AMBULATORY_CARE_PROVIDER_SITE_OTHER): Payer: Medicare Other | Admitting: Pharmacist Clinician (PhC)/ Clinical Pharmacy Specialist

## 2013-11-21 ENCOUNTER — Encounter: Payer: Self-pay | Admitting: Emergency Medicine

## 2013-11-21 VITALS — BP 124/82 | HR 76 | Ht 68.0 in | Wt 177.0 lb

## 2013-11-21 DIAGNOSIS — R5381 Other malaise: Secondary | ICD-10-CM | POA: Insufficient documentation

## 2013-11-21 DIAGNOSIS — E663 Overweight: Secondary | ICD-10-CM | POA: Insufficient documentation

## 2013-11-21 DIAGNOSIS — E785 Hyperlipidemia, unspecified: Secondary | ICD-10-CM

## 2013-11-21 DIAGNOSIS — E119 Type 2 diabetes mellitus without complications: Secondary | ICD-10-CM | POA: Insufficient documentation

## 2013-11-21 DIAGNOSIS — I428 Other cardiomyopathies: Secondary | ICD-10-CM | POA: Insufficient documentation

## 2013-11-21 DIAGNOSIS — C3490 Malignant neoplasm of unspecified part of unspecified bronchus or lung: Secondary | ICD-10-CM

## 2013-11-21 DIAGNOSIS — I10 Essential (primary) hypertension: Secondary | ICD-10-CM | POA: Insufficient documentation

## 2013-11-21 DIAGNOSIS — Z794 Long term (current) use of insulin: Secondary | ICD-10-CM | POA: Insufficient documentation

## 2013-11-21 DIAGNOSIS — R9439 Abnormal result of other cardiovascular function study: Secondary | ICD-10-CM

## 2013-11-21 DIAGNOSIS — J449 Chronic obstructive pulmonary disease, unspecified: Secondary | ICD-10-CM | POA: Insufficient documentation

## 2013-11-21 DIAGNOSIS — R0609 Other forms of dyspnea: Secondary | ICD-10-CM | POA: Insufficient documentation

## 2013-11-21 DIAGNOSIS — Z87891 Personal history of nicotine dependence: Secondary | ICD-10-CM | POA: Insufficient documentation

## 2013-11-21 DIAGNOSIS — C3491 Malignant neoplasm of unspecified part of right bronchus or lung: Secondary | ICD-10-CM

## 2013-11-21 DIAGNOSIS — J4489 Other specified chronic obstructive pulmonary disease: Secondary | ICD-10-CM | POA: Insufficient documentation

## 2013-11-21 DIAGNOSIS — I4891 Unspecified atrial fibrillation: Secondary | ICD-10-CM

## 2013-11-21 DIAGNOSIS — Z7901 Long term (current) use of anticoagulants: Secondary | ICD-10-CM

## 2013-11-21 DIAGNOSIS — R0989 Other specified symptoms and signs involving the circulatory and respiratory systems: Secondary | ICD-10-CM | POA: Insufficient documentation

## 2013-11-21 DIAGNOSIS — R931 Abnormal findings on diagnostic imaging of heart and coronary circulation: Secondary | ICD-10-CM

## 2013-11-21 MED ORDER — AMINOPHYLLINE 25 MG/ML IV SOLN
75.0000 mg | Freq: Once | INTRAVENOUS | Status: AC
  Administered 2013-11-21: 75 mg via INTRAVENOUS

## 2013-11-21 MED ORDER — REGADENOSON 0.4 MG/5ML IV SOLN
0.4000 mg | Freq: Once | INTRAVENOUS | Status: AC
  Administered 2013-11-21: 0.4 mg via INTRAVENOUS

## 2013-11-21 MED ORDER — TECHNETIUM TC 99M SESTAMIBI GENERIC - CARDIOLITE
9.7000 | Freq: Once | INTRAVENOUS | Status: AC | PRN
  Administered 2013-11-21: 10 via INTRAVENOUS

## 2013-11-21 MED ORDER — TECHNETIUM TC 99M SESTAMIBI GENERIC - CARDIOLITE
29.8000 | Freq: Once | INTRAVENOUS | Status: AC | PRN
  Administered 2013-11-21: 29.8 via INTRAVENOUS

## 2013-11-21 NOTE — Progress Notes (Signed)
HPI:  72 year old man, former smoker (60 pack years), history of diabetes, hyperlipidemia and COPD ( FEV1 0.93L). He has been followed by Dr. Sherene Sires for a lung mass with right hilar lymphadenopathy. Bronchoscopy revealed extrinsic compression of the bronchus intermedius. Needle biopsies were performed on 08/21/13 but the cytology was negative. He is referred now for consideration of repeat bronchoscopy and possibly EBUS. He is on Tudorza bid, Symbicort bit, SABA once a day.   ROV 11/21/13 -- follows up post bronchoscopy and biopsies of bronchus intermedius obstruction and right hilar mass. His biopsy showed adenocarcinoma of the lung. He does have some residual congestion.  He is planning to see oncology and radiation oncology at [facility]. He remains on [place] and Symbicort.    Past Medical History  Diagnosis Date  . Hyperlipidemia   . Type II or unspecified type diabetes mellitus without mention of complication, not stated as uncontrolled   . Chronic airway obstruction, not elsewhere classified     HFA 25-50% 07-16-2010> 100% 01-28-2011; PFT's 08-13-10 FEV1 .93(34%) ratio 37 with 23% better p B2 DLCO 69%  . Shortness of breath   . Hypertension   . Arthritis     in hands  . Dysrhythmia     Atrial Fibrillation; takes Cardizem  . Lung cancer 11/06/2013    Diagnosed during bronchoscopy     Family History  Problem Relation Age of Onset  . Diabetes Father   . Diabetes Brother   . Diabetes Sister   . Lung cancer Sister   . Liver disease Sister   . Lung cancer Daughter   . Diabetes Daughter   . Hypertension Daughter   . Hyperlipidemia Daughter   . Heart murmur Daughter   . Hyperlipidemia Daughter   . Hypertension Daughter   . Cancer Daughter     lung cancer  . Cancer Sister     lung cancer     History   Social History  . Marital Status: Married    Spouse Name: N/A    Number of Children: N/A  . Years of Education: N/A   Occupational History  . Not on file.   Social History Main  Topics  . Smoking status: Former Smoker -- 1.50 packs/day for 40 years    Types: Cigarettes    Quit date: 12/13/2002  . Smokeless tobacco: Current User    Types: Chew  . Alcohol Use: No  . Drug Use: No  . Sexual Activity: No   Other Topics Concern  . Not on file   Social History Narrative   He has been married for 48 years to his wife Darel Hong.   They have 3 daughters, 2 still alive (Debra died at age 32 of lung cancer) -- 5 grandchildren   Former smoker: Quit 2004; 1.5 pack per day for 40 years; does not drink alcohol or use illicit drugs   Does not get routine exercise.     No Known Allergies   Outpatient Prescriptions Prior to Visit  Medication Sig Dispense Refill  . Aclidinium Bromide (TUDORZA PRESSAIR) 400 MCG/ACT AEPB Inhale 1 puff into the lungs 2 (two) times daily.      Marland Kitchen albuterol (PROVENTIL HFA;VENTOLIN HFA) 108 (90 BASE) MCG/ACT inhaler Inhale 2 puffs into the lungs every 6 (six) hours as needed for wheezing or shortness of breath.       Marland Kitchen albuterol (PROVENTIL) (2.5 MG/3ML) 0.083% nebulizer solution Take 3 mLs (2.5 mg total) by nebulization every 6 (six) hours as needed for wheezing or shortness  of breath.  75 mL  12  . budesonide-formoterol (SYMBICORT) 160-4.5 MCG/ACT inhaler Inhale 2 puffs into the lungs 2 (two) times daily.        Marland Kitchen diltiazem (CARDIZEM) 60 MG tablet Take 1 tablet (60 mg total) by mouth 2 (two) times daily.  60 tablet  6  . doxazosin (CARDURA) 2 MG tablet Take 2 mg by mouth daily.        . metFORMIN (GLUCOPHAGE) 500 MG tablet Take 500 mg by mouth 2 (two) times daily with a meal.       . simvastatin (ZOCOR) 40 MG tablet Take 0.5 tablets (20 mg total) by mouth at bedtime.  30 tablet  6  . warfarin (COUMADIN) 5 MG tablet Take 1 tablet (5 mg total) by mouth daily.  30 tablet  3   No facility-administered medications prior to visit.    Filed Vitals:   11/21/13 1156  BP: 124/82  Pulse: 76  Height: 5\' 8"  (1.727 m)  Weight: 177 lb (80.287 kg)  SpO2: 98%    Gen: Pleasant, well-nourished, in no distress,  normal affect  ENT: No lesions,  mouth clear,  oropharynx clear, no postnasal drip  Neck: No JVD, no TMG, no carotid bruits  Lungs: No use of accessory muscles, clear without rales or rhonchi  Cardiovascular: irreg irreg, heart sounds normal, no murmur or gallops, no peripheral edema  Musculoskeletal: No deformities, no cyanosis or clubbing  Neuro: alert, non focal  Skin: Warm, no lesions or rashes  ECG today >> A Fib with rate 75, no ischemic changes.   08/16/13 --  Comparison: Chest x-ray 06/13/2013.  Findings:  Mediastinum: Heart size is normal. There is no significant  pericardial fluid, thickening or pericardial calcification. There  is atherosclerosis of the thoracic aorta, the great vessels of the  mediastinum and the coronary arteries, including calcified  atherosclerotic plaque in the left main, left anterior descending,  left circumflex and right coronary arteries. Enlarged right hilar  lymph node measuring 1.6 x 2.2 cm (image 26 of series 2). No  definite pathologically enlarged mediastinal or left hilar lymph  nodes are noted. Esophagus is unremarkable in appearance. Mild  calcifications of the aortic valve.  Lungs/Pleura: In the right upper lobe there is a 4.2 x 2.9 cm  macrolobulated mass with spiculated margins (image 20 of series 3).  In addition, more posteriorly in the right upper lobe there is a  1.5 x 1.4 cm macrolobulated nodule with some spiculated margins. A  1.7 x 1.4 cm nodular area of ground-glass attenuation in the  posterior aspect of the left lower lobe (image 26 of series three)  is also noted. There is a background of mild to moderate central  lobular and paraseptal emphysema.  Upper Abdomen: Surgical clips near the celiac axis. Mild  adreniform thickening of the right adrenal gland which is  incompletely visualized.  Musculoskeletal: A small fatty attenuation lesion in the right  serratus  anterior musculature, likely represent an intramuscular  lipoma. There are no aggressive appearing lytic or blastic lesions  noted in the visualized portions of the skeleton.  IMPRESSION:  1. Aggressive appearing right upper lobe pulmonary mass and nodule  with a right hilar lymphadenopathy, as detailed above. This is  favored to represent a primary bronchogenic neoplasm with nodal  metastasis to the right hilum and satellite nodule in the same lobe  (i.e., T3, N1, Mx disease), however, this could alternatively  represent synchronous right upper lobe neoplasms, or less likely  an aggressive atypical infectious process such as a fungal  pneumonia (not strongly favored). Clinical correlation is strongly  recommended, with consideration for PET CT and/or biopsy at this  time.  2. In addition, there is a nonspecific 1.7 x 1.4 cm ground-glass  attenuation nodule in the left lower lobe which warrants attention  on follow-up studies. Per guidelines the initial follow-up by  chest CT without contrast is recommended in 3 months to confirm  persistence, however, this lesion could likely be followed on any  routine follow-up scans performed for the findings in number 1  above.  3. Atherosclerosis, including left main and three-vessel coronary  artery disease. Assessment for potential risk factor modification,  dietary therapy or pharmacologic therapy may be warranted, if  clinically indicated.  4. There are calcifications of the aortic valve. Echocardiographic  correlation for evaluation of potential valvular dysfunction may be  warranted if clinically indicated.  5. Additional incidental findings, as above.   COPD GOLD III/IV - continue symbicort and tudorza as ordered   Adenocarcinoma, lung Following with oncology and radiation oncology next week PET scan is pending His EGFR and Alk genetics do not show mutation

## 2013-11-21 NOTE — Patient Instructions (Signed)
Please continue Tudorza and Symbicort as you have been taking them Follow with oncology and radiation oncology as planned Followup with Dr. Sherene Sires as needed

## 2013-11-21 NOTE — Assessment & Plan Note (Signed)
-   continue symbicort and tudorza as ordered

## 2013-11-21 NOTE — Assessment & Plan Note (Signed)
Following with oncology and radiation oncology next week PET scan is pending His EGFR and Alk genetics do not show mutation

## 2013-11-21 NOTE — Procedures (Addendum)
[facility] [address] [address] [place] [address] [place] [address] [phone]  Cardiology Nuclear Med Study  Brent Wade is a 72 y.o. male     MRN : [id number]     DOB: 09/11/1941  Procedure Date: 11/21/2013  Nuclear Med Background Indication for Stress Test:  Abnormal EKG History:  COPD and Idiopathic Cardiomyopathy Cardiac Risk Factors: History of Smoking, Hypertension, IDDM Type 2, Lipids and Overweight  Symptoms:  DOE, Fatigue and SOB   Nuclear Pre-Procedure Caffeine/Decaff Intake:  7:00pm NPO After: 5:00am   IV Site: R Hand  IV 0.9% NS with Angio Cath:  22g  Chest Size (in):  42"  IV Started by: Emmit Pomfret, RN  Height: 5\' 8"  (1.727 m)  Cup Size: n/a  BMI:  Body mass index is 26.92 kg/(m^2). Weight:  177 lb (80.287 kg)   Tech Comments:  n/a    Nuclear Med Study 1 or 2 day study: 1 day  Stress Test Type:  Lexiscan  Order Authorizing Provider:  Bryan Lemma, MD   Resting Radionuclide: Technetium 51m Sestamibi  Resting Radionuclide Dose: 9.7 mCi   Stress Radionuclide:  Technetium 40m Sestamibi  Stress Radionuclide Dose: 29.8 mCi           Stress Protocol Rest HR: 93 Stress HR:108  Rest BP:122/83 Stress BP: 139/90  Exercise Time (min): n/a METS: n/a          Dose of Adenosine (mg):  n/a Dose of Lexiscan: 0.4 mg  Dose of Atropine (mg): n/a Dose of Dobutamine: n/a mcg/kg/min (at max HR)  Stress Test Technologist: Ernestene Mention, CCT Nuclear Technologist: Gonzella Lex, CNMT   Rest Procedure:  Myocardial perfusion imaging was performed at rest 45 minutes following the intravenous administration of Technetium 53m Sestamibi. Stress Procedure:  The patient received IV Lexiscan 0.4 mg over 15-seconds.  Technetium 82m Sestamibi injected at 30-seconds.  Due to patient's shortness of breath, he was given IV Aminophylline 75 mg. Symptom was resolved during recovery. There were no significant changes with Lexiscan.  Quantitative  spect images were obtained after a 45 minute delay.  Transient Ischemic Dilatation (Normal <1.22):  0.93 Lung/Heart Ratio (Normal <0.45):  0.28 QGS EDV:  n/a ml QGS ESV:  n/a ml LV Ejection Fraction: Study not gated  Signed by       Rest ECG: Atrial Fibrilliation  Stress ECG: No significant change from baseline ECG  QPS Raw Data Images:  Normal; no motion artifact; normal heart/lung ratio. Stress Images:  Normal homogeneous uptake in all areas of the myocardium. Rest Images:  Normal homogeneous uptake in all areas of the myocardium. Subtraction (SDS):  No evidence of ischemia.  Impression Exercise Capacity:  Lexiscan with no exercise. BP Response:  Normal blood pressure response. Clinical Symptoms:  No significant symptoms noted. ECG Impression:  No significant ST segment change suggestive of ischemia. Comparison with Prior Nuclear Study: No previous nuclear study performed  Overall Impression:  Normal stress nuclear study.  LV Wall Motion:  Not gated secondary to afib   Runell Gess, MD  11/21/2013 1:11 PM

## 2013-11-22 ENCOUNTER — Telehealth: Payer: Self-pay | Admitting: *Deleted

## 2013-11-22 NOTE — Telephone Encounter (Signed)
Spoke to patient. Result given . Verbalized understanding  

## 2013-11-22 NOTE — Telephone Encounter (Signed)
Message copied by Tobin Chad on Thu Nov 22, 2013  3:58 PM ------      Message from: Marykay Lex      Created: Wed Nov 21, 2013 10:22 PM       Good news. No sign of a large artery coronary disease. "Normal study" with no ischemia or infarction.      Unfortunately with atrial fibrillation, unable to assess pump function.            Marykay Lex, MD ------

## 2013-11-23 ENCOUNTER — Ambulatory Visit (HOSPITAL_COMMUNITY)
Admit: 2013-11-23 | Discharge: 2013-11-23 | Disposition: A | Discharge status: Home / Self Care | Payer: Medicare Other | Attending: Hematology and Oncology | Admitting: Hematology and Oncology

## 2013-11-23 DIAGNOSIS — R911 Solitary pulmonary nodule: Secondary | ICD-10-CM | POA: Insufficient documentation

## 2013-11-23 DIAGNOSIS — K8689 Other specified diseases of pancreas: Secondary | ICD-10-CM | POA: Insufficient documentation

## 2013-11-23 DIAGNOSIS — K6389 Other specified diseases of intestine: Secondary | ICD-10-CM | POA: Insufficient documentation

## 2013-11-23 DIAGNOSIS — C3410 Malignant neoplasm of upper lobe, unspecified bronchus or lung: Secondary | ICD-10-CM | POA: Insufficient documentation

## 2013-11-23 DIAGNOSIS — N281 Cyst of kidney, acquired: Secondary | ICD-10-CM | POA: Insufficient documentation

## 2013-11-23 DIAGNOSIS — I708 Atherosclerosis of other arteries: Secondary | ICD-10-CM | POA: Insufficient documentation

## 2013-11-23 DIAGNOSIS — Z905 Acquired absence of kidney: Secondary | ICD-10-CM | POA: Insufficient documentation

## 2013-11-23 DIAGNOSIS — N3289 Other specified disorders of bladder: Secondary | ICD-10-CM | POA: Insufficient documentation

## 2013-11-23 MED ORDER — FLUDEOXYGLUCOSE F - 18 (FDG) INJECTION
16.0000 | Freq: Once | INTRAVENOUS | Status: AC | PRN
  Administered 2013-11-23: 16 via INTRAVENOUS

## 2013-11-26 ENCOUNTER — Ambulatory Visit (HOSPITAL_BASED_OUTPATIENT_CLINIC_OR_DEPARTMENT_OTHER): Payer: Medicare Other

## 2013-11-26 ENCOUNTER — Ambulatory Visit (HOSPITAL_BASED_OUTPATIENT_CLINIC_OR_DEPARTMENT_OTHER): Payer: Medicare Other | Admitting: Hematology and Oncology

## 2013-11-26 ENCOUNTER — Telehealth: Payer: Self-pay | Admitting: Pharmacist Clinician (PhC)/ Clinical Pharmacy Specialist

## 2013-11-26 ENCOUNTER — Telehealth: Payer: Self-pay | Admitting: Hematology and Oncology

## 2013-11-26 ENCOUNTER — Telehealth: Payer: Self-pay | Admitting: *Deleted

## 2013-11-26 VITALS — BP 119/74 | HR 71 | Temp 97.3°F | Resp 19 | Ht 68.0 in | Wt 180.1 lb

## 2013-11-26 DIAGNOSIS — C3490 Malignant neoplasm of unspecified part of unspecified bronchus or lung: Secondary | ICD-10-CM

## 2013-11-26 DIAGNOSIS — C3491 Malignant neoplasm of unspecified part of right bronchus or lung: Secondary | ICD-10-CM

## 2013-11-26 LAB — CBC WITH DIFFERENTIAL/PLATELET
BASO%: 0.7 % (ref 0.0–2.0)
Eosinophils Absolute: 0.1 10*3/uL (ref 0.0–0.5)
LYMPH%: 17.1 % (ref 14.0–49.0)
MCHC: 33.3 g/dL (ref 32.0–36.0)
MCV: 91.5 fL (ref 79.3–98.0)
MONO%: 8.4 % (ref 0.0–14.0)
NEUT#: 5 10*3/uL (ref 1.5–6.5)
Platelets: 194 10*3/uL (ref 140–400)
RBC: 4.46 10*6/uL (ref 4.20–5.82)

## 2013-11-26 LAB — COMPREHENSIVE METABOLIC PANEL (CC13)
ALT: 11 U/L (ref 0–55)
Alkaline Phosphatase: 79 U/L (ref 40–150)
BUN: 10.1 mg/dL (ref 7.0–26.0)
Glucose: 154 mg/dl — ABNORMAL HIGH (ref 70–140)
Sodium: 139 mEq/L (ref 136–145)
Total Bilirubin: 0.37 mg/dL (ref 0.20–1.20)
Total Protein: 7.1 g/dL (ref 6.4–8.3)

## 2013-11-26 MED ORDER — CYANOCOBALAMIN 1000 MCG/ML IJ SOLN
INTRAMUSCULAR | Status: AC
  Filled 2013-11-26: qty 1

## 2013-11-26 MED ORDER — DEXAMETHASONE 4 MG PO TABS: ORAL_TABLET | ORAL | Status: DC

## 2013-11-26 MED ORDER — FOLIC ACID 1 MG PO TABS: 1.0000 mg | ORAL_TABLET | Freq: Every day | ORAL | Status: DC

## 2013-11-26 MED ORDER — LIDOCAINE-PRILOCAINE 2.5-2.5 % EX CREA: 1.0000 "application " | TOPICAL_CREAM | CUTANEOUS | Status: DC | PRN

## 2013-11-26 MED ORDER — ONDANSETRON HCL 8 MG PO TABS: 8.0000 mg | ORAL_TABLET | Freq: Three times a day (TID) | ORAL | Status: DC | PRN

## 2013-11-26 MED ORDER — CYANOCOBALAMIN 1000 MCG/ML IJ SOLN
1000.0000 ug | Freq: Once | INTRAMUSCULAR | Status: AC
  Administered 2013-11-26: 1000 ug via INTRAMUSCULAR

## 2013-11-26 MED ORDER — PROCHLORPERAZINE MALEATE 10 MG PO TABS: 10.0000 mg | ORAL_TABLET | Freq: Four times a day (QID) | ORAL | Status: DC | PRN

## 2013-11-26 NOTE — Telephone Encounter (Signed)
Wants to know if they can hold his coumadin for 4 days to place a porta cath?

## 2013-11-26 NOTE — Telephone Encounter (Signed)
Gave pt appt for chemo class and sent to labs today, left message on [place] VM for chemo

## 2013-11-26 NOTE — Progress Notes (Signed)
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  SUMMARY OF ONCOLOGIC HISTORY: Oncology History   Adenocarcinoma, lung (EGFR and ALK mutation negative)   Primary site: Lung (Right)   Staging method: AJCC 7th Edition   Clinical: Stage IV (T4, N1, M1a) signed by Artis Delay, MD on 11/26/2013  4:20 PM   Pathologic: Stage IV (T4, N1, M1a) signed by Artis Delay, MD on 11/26/2013  4:20 PM   Summary: Stage IV (T4, N1, M1a)       Adenocarcinoma, lung   08/16/2013 Imaging Ct scan chest was abnormal with mass in RUL, right hilar LN, ipsilateral nodule and contralateral lung nodule   08/21/2013 Procedure Transbronchial biopsy was non-diagnostic   10/23/2013 Imaging repeat Ct scan showed enlarging mass   10/31/2013 Procedure Repeat bronchoscopy and biopsy confirmed adenocarcinoma of the lung, TTF 1 positive   11/06/2013 Imaging MRI head negative   11/23/2013 Imaging PET/CT scan confirmed activity in RUL mass, and both other nodules were enlarging in size, likely metastatic    INTERVAL HISTORY: Brent Wade 72 y.o. male returns for further followup. He has mild shortness breath and cough. Denies any hemoptysis. Denies any chest pain.  I have reviewed the past medical history, past surgical history, social history and family history with the patient and they are unchanged from previous note.  ALLERGIES:  has No Known Allergies.  MEDICATIONS:  Current Outpatient Prescriptions  Medication Sig Dispense Refill  . Aclidinium Bromide (TUDORZA PRESSAIR) 400 MCG/ACT AEPB Inhale 1 puff into the lungs 2 (two) times daily.      Marland Kitchen albuterol (PROVENTIL HFA;VENTOLIN HFA) 108 (90 BASE) MCG/ACT inhaler Inhale 2 puffs into the lungs every 6 (six) hours as needed for wheezing or shortness  of breath.       Marland Kitchen albuterol (PROVENTIL) (2.5 MG/3ML) 0.083% nebulizer solution Take 3 mLs (2.5 mg total) by nebulization every 6 (six) hours as needed for wheezing or shortness of breath.  75 mL  12  . budesonide-formoterol (SYMBICORT) 160-4.5 MCG/ACT inhaler Inhale 2 puffs into the lungs 2 (two) times daily.        Marland Kitchen diltiazem (CARDIZEM) 60 MG tablet Take 1 tablet (60 mg total) by mouth 2 (two) times daily.  60 tablet  6  . doxazosin (CARDURA) 2 MG tablet Take 2 mg by mouth daily.        . metFORMIN (GLUCOPHAGE) 500 MG tablet Take 500 mg by mouth 2 (two) times daily with a meal.       . simvastatin (ZOCOR) 40 MG tablet Take 0.5 tablets (20 mg total) by mouth at bedtime.  30 tablet  6  . warfarin (COUMADIN) 5 MG tablet Take 1 tablet (5 mg total) by mouth daily.  30 tablet  3  . dexamethasone (DECADRON) 4 MG tablet Take 2 tab two times a day the day before chemo and the day after chemo  30 tablet  1  . folic acid (FOLVITE) 1 MG tablet Take 1 tablet (1 mg total) by mouth daily. Take 1 day  100 tablet  3  . lidocaine-prilocaine (EMLA) cream Apply 1 application topically as needed.  30 g  0  . ondansetron (ZOFRAN) 8 MG tablet Take 1 tablet (8 mg total) by mouth every 8 (eight) hours as needed.  30 tablet  1  . prochlorperazine (COMPAZINE) 10 MG tablet Take 1 tablet (10 mg total) by mouth every 6 (six) hours as needed (Nausea or vomiting).  30 tablet  1   No current facility-administered medications for this visit.    REVIEW OF SYSTEMS:   Constitutional: Denies fevers, chills or abnormal weight loss Eyes: Denies blurriness of vision Ears, nose, mouth, throat, and face: Denies mucositis or sore throat Cardiovascular: Denies palpitation, chest discomfort or lower extremity swelling Gastrointestinal:  Denies nausea, heartburn or change in bowel habits Skin: Denies abnormal skin rashes Lymphatics: Denies new lymphadenopathy or easy bruising Neurological:Denies numbness, tingling or new  weaknesses Behavioral/Psych: Mood is stable, no new changes  All other systems were reviewed with the patient and are negative.  PHYSICAL EXAMINATION: ECOG PERFORMANCE STATUS: 1 - Symptomatic but completely ambulatory  Filed Vitals:   11/26/13 1248  BP: 119/74  Pulse: 71  Temp: 97.3 F (36.3 C)  Resp: 19   Filed Weights   11/26/13 1248  Weight: 180 lb 1.6 oz (81.693 kg)    GENERAL:alert, no distress and comfortable SKIN: skin color, texture, turgor are normal, no rashes or significant lesions EYES: normal, Conjunctiva are pink and non-injected, sclera clear OROPHARYNX:no exudate, no erythema and lips, buccal mucosa, and tongue normal  NECK: supple, thyroid normal size, non-tender, without nodularity LYMPH:  no palpable lymphadenopathy in the cervical, axillary or inguinal LUNGS: clear to auscultation and percussion with normal breathing effort HEART: regular rate & rhythm and no murmurs and no lower extremity edema ABDOMEN:abdomen soft, non-tender and normal bowel sounds Musculoskeletal:no cyanosis of digits and no clubbing  NEURO: alert & oriented x 3 with fluent speech, no focal motor/sensory deficits  LABORATORY DATA:  I have reviewed the data as listed    Component Value Date/Time   NA 139 11/26/2013 1338   NA 138 11/07/2013 0320   K 3.9 11/26/2013 1338   K 4.0 11/07/2013 0320   CL 102 11/07/2013 0320   CO2 25 11/26/2013 1338   CO2 25 11/07/2013 0320   GLUCOSE 154* 11/26/2013 1338   GLUCOSE 192* 11/07/2013 0320   BUN 10.1 11/26/2013 1338   BUN 12 11/07/2013 0320   CREATININE 1.1 11/26/2013 1338   CREATININE 1.03 11/07/2013 0320   CALCIUM 10.0 11/26/2013 1338   CALCIUM 9.2 11/07/2013 0320   PROT 7.1 11/26/2013 1338   PROT 7.1 11/06/2013 1314   ALBUMIN 3.8 11/26/2013 1338   ALBUMIN 3.6 11/06/2013 1314   AST 11 11/26/2013 1338   AST 11 11/06/2013 1314   ALT 11 11/26/2013 1338   ALT 7 11/06/2013 1314   ALKPHOS 79 11/26/2013 1338   ALKPHOS 74 11/06/2013 1314    BILITOT 0.37 11/26/2013 1338   BILITOT 0.2* 11/06/2013 1314   GFRNONAA 70* 11/07/2013 0320   GFRAA 81* 11/07/2013 0320    No results found for this basename: SPEP, UPEP,  kappa and lambda light chains    Lab Results  Component Value Date   WBC 6.8 11/26/2013   NEUTROABS 5.0 11/26/2013   HGB 13.6 11/26/2013   HCT 40.8 11/26/2013   MCV 91.5 11/26/2013   PLT 194 11/26/2013      Chemistry      Component Value Date/Time   NA 139 11/26/2013 1338   NA 138 11/07/2013 0320   K 3.9 11/26/2013 1338   K 4.0 11/07/2013 0320   CL 102 11/07/2013 0320   CO2 25 11/26/2013 1338   CO2 25 11/07/2013 0320   BUN 10.1 11/26/2013 1338  BUN 12 11/07/2013 0320   CREATININE 1.1 11/26/2013 1338   CREATININE 1.03 11/07/2013 0320      Component Value Date/Time   CALCIUM 10.0 11/26/2013 1338   CALCIUM 9.2 11/07/2013 0320   ALKPHOS 79 11/26/2013 1338   ALKPHOS 74 11/06/2013 1314   AST 11 11/26/2013 1338   AST 11 11/06/2013 1314   ALT 11 11/26/2013 1338   ALT 7 11/06/2013 1314   BILITOT 0.37 11/26/2013 1338   BILITOT 0.2* 11/06/2013 1314     RADIOGRAPHIC STUDIES: I reviewed the most recent CT scan and PET scan with the patient and family members I have personally reviewed the radiological images as listed and agreed with the findings in the report.   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 Any form of treatment is for palliative intent only The patient is not a surgical candidate due for stage IV disease. He has an appointment to see radiation oncologist. At this point in time, I felt that there is no role for radiation treatment. I recommend systemic chemotherapy to start as soon as possible. I would order baseline blood work today. I recommend placement of Infuse-a-Port. I discussed with him previous treatment options but I felt to 2 reasonable performance status, I will like to give him  combination chemotherapy with carboplatin and Alimta for 4 doses followed by maintain his therapy with Alimta if he has positive response to treatment. We discussed the role of chemotherapy. The intent is for palliative.  We discussed some of the risks, benefits, side-effects of carboplatin & Alimta.  Some of the short term side-effects included, though not limited to, including weight loss, life threatening infections, risk of allergic reactions, need for transfusions of blood products, nausea, vomiting, change in bowel habits, loss of hair, admission to hospital for various reasons, and risks of death.   Long term side-effects are also discussed including risks of infertility, permanent damage to nerve function, hearing loss, chronic fatigue, kidney damage with possibility needing hemodialysis, and rare secondary malignancy including bone marrow disorders.  The patient is aware that the response rates discussed earlier is not guaranteed.  After a long discussion, patient made an informed decision to proceed with the prescribed plan of care and went ahead to sign the consent form today.   Patient education material was dispensed. I recommend chemotherapy education class for the patient. Due to his age and comorbidities, I recommend Neulasta injection after each cycle. I will proceed with B12 injection today. #2 history of atrial fibrillation on anticoagulation therapy We will monitor his anticoagulation therapy carefully while on chemotherapy. Orders Placed This Encounter  Procedures  . IR Fluoro Guide CV Line Right    Place port on right side for chemo    Standing Status: Future     Number of Occurrences:      Standing Expiration Date: 01/27/2015    Order Specific Question:  Reason for Exam (SYMPTOM  OR DIAGNOSIS REQUIRED)    Answer:  need infusaport placement    Order Specific Question:  Preferred Imaging Location?    Answer:  [facility]  . Comprehensive metabolic panel     Standing Status: Future     Number of Occurrences:      Standing Expiration Date: 11/26/2014  . CBC with Differential    Standing Status: Future     Number of Occurrences:      Standing Expiration  Date: 08/18/2014  . CBC with Differential    Standing Status: Standing     Number of Occurrences: 9     Standing Expiration Date: 11/26/2014  . Comprehensive metabolic panel    Standing Status: Standing     Number of Occurrences: 9     Standing Expiration Date: 11/26/2014   All questions were answered. The patient knows to call the clinic with any problems, questions or concerns. No barriers to learning was detected.    [facility], Tristian Bouska, MD 11/26/2013 4:25 PM

## 2013-11-26 NOTE — Telephone Encounter (Signed)
Per staff message and POF I have scheduled appts.  JMW  

## 2013-11-26 NOTE — Telephone Encounter (Signed)
Reviewed with Dr. Herbie Baltimore - ok to hold warfarin x 4 days for porta cath insertion.  Restart at same dose at bedtime after procedure complete.

## 2013-11-27 ENCOUNTER — Telehealth: Payer: Self-pay | Admitting: Hematology and Oncology

## 2013-11-27 ENCOUNTER — Encounter: Payer: Self-pay | Admitting: *Deleted

## 2013-11-27 ENCOUNTER — Other Ambulatory Visit: Payer: Medicare Other

## 2013-11-27 NOTE — Progress Notes (Signed)
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 He had multiple chest x-ray done, and over the past few years, was noted to have lung nodule On 08/16/2013, he had CT scan of the chest which show right upper lobe mass, right upper lobe nodule and right hilar lymphadenopathy as well as abnormal nodule in the left lower lung, worrisome for lung cancer with possible metastasis.  On 08/21/2013, transbronchial biopsy was nondiagnostic.  On 10/23/2013, he had repeat CT scan of the chest which show interval enlargement of right upper lobe and the left lower lobe lung nodule was stable right hilar lymphadenopathy. He subsequently underwent repeat bronchoscopy and biopsy which came back positive for non-small cell lung cancer, adenocarcinoma subtype  Biopsies of right Lung (if applicable) revealed: Non Small Cell Lung Cancer  of the Right Upper Lobe  Tobacco/Marijuana/Snuff/ETOH use: Smoker x 40 years; Quit 2004, No alcohol, No recreational drug use  Past/Anticipated interventions by cardiothoracic surgery, if any:  Bronchoscopy /Biopsy of Right Upper Lobe  Past/Anticipated interventions by medical oncology, if any: chemotherapy with carboplatin & Alimta and Neulasta support  Signs/Symptoms  Weight changes, if any:6 lb increase.  Respiratory complaints, if any: COPD. Significant coughing following bronchoscopy  Hemoptysis, if WUJ:[id number] hemoptysis early morning  Pain issues, if FAO:ZHYQ ache right lateral chest wall.  SAFETY ISSUES:  Prior radiation? No  Pacemaker/ICD? No  Possible current pregnancy? N/A  Is the patient on methotrexate? No  Current Complaints / other details:Scheduled for chemotherapy on Friday 11/30/13 if patient decides to go forth with radiation.

## 2013-11-27 NOTE — Telephone Encounter (Signed)
I spoke with radiation oncologist, Dr. Michell Heinrich and the patient with family today. I recommend the patient keeps his radiation oncology appointment tomorrow for discussion about possible treatment to his disease with radiation alone. I discussed with the patient pros and cons of each approach. If the patient decides to pursue radiation therapy alone for now, I will cancel his chemotherapy and plan for followup in 2-3 months from now.

## 2013-11-28 ENCOUNTER — Ambulatory Visit
Admission: RE | Admit: 2013-11-28 | Discharge: 2013-11-28 | Disposition: A | Discharge status: Home / Self Care | Payer: Medicare Other | Source: Ambulatory Visit | Attending: Radiation Oncology | Admitting: Radiation Oncology

## 2013-11-28 ENCOUNTER — Encounter: Payer: Self-pay | Admitting: Radiation Oncology

## 2013-11-28 ENCOUNTER — Telehealth: Payer: Self-pay | Admitting: Hematology and Oncology

## 2013-11-28 VITALS — BP 128/86 | HR 102 | Temp 97.7°F | Resp 22 | Wt 181.4 lb

## 2013-11-28 DIAGNOSIS — R0602 Shortness of breath: Secondary | ICD-10-CM | POA: Insufficient documentation

## 2013-11-28 DIAGNOSIS — C3491 Malignant neoplasm of unspecified part of right bronchus or lung: Secondary | ICD-10-CM

## 2013-11-28 DIAGNOSIS — R911 Solitary pulmonary nodule: Secondary | ICD-10-CM | POA: Insufficient documentation

## 2013-11-28 DIAGNOSIS — Z79899 Other long term (current) drug therapy: Secondary | ICD-10-CM | POA: Insufficient documentation

## 2013-11-28 DIAGNOSIS — C3410 Malignant neoplasm of upper lobe, unspecified bronchus or lung: Secondary | ICD-10-CM | POA: Insufficient documentation

## 2013-11-28 NOTE — Progress Notes (Signed)
Department of Radiation Oncology  Phone:  [phone] Fax:        [phone]   Name: Brent Wade MRN: [id number]  DOB: Jun 24, 1941  Date: 11/28/2013  Follow Up Visit Note  Diagnosis: T3 vs synchronous T1 and T2 cancers of the right upper lobe  Interval History: Javion presents today for routine followup.  Since I saw him in the hospital before Thanksgiving he has had a PET scan. This was performed on 1212. This showed the dominant right upper lobe mass which measured 3.8 x 2.8 cm had an SUV of 5.2. The satellite lesion in the right upper lobe which measured 1.6 x 1.4 cm had an SUV of 2.4. The right hilar lymph node which had enlarged on the September scan but was stable from September to November was not hypermetabolic on PET. The less than 1 cm lesion in the left as well as the groundglass opacity in the left lung was not hypermetabolic as well. His brain MRI as an inpatient was negative for metastatic disease. His wife has been admitted to hospice for her renal cell cancer and is being cared for by the patient at home. He presents with his daughter today for discussion of treatment options. He had met with Dr. Emeline Darling such and then the chemotherapy school for discussion of systemic chemotherapy in the management of his disease. He denies any hemoptysis. He has stable shortness of breath.  Allergies: No Known Allergies  Medications:  Current Outpatient Prescriptions  Medication Sig Dispense Refill  . Aclidinium Bromide (TUDORZA PRESSAIR) 400 MCG/ACT AEPB Inhale 1 puff into the lungs 2 (two) times daily.      Marland Kitchen albuterol (PROVENTIL HFA;VENTOLIN HFA) 108 (90 BASE) MCG/ACT inhaler Inhale 2 puffs into the lungs every 6 (six) hours as needed for wheezing or shortness of breath.       Marland Kitchen albuterol (PROVENTIL) (2.5 MG/3ML) 0.083% nebulizer solution Take 3 mLs (2.5 mg total) by nebulization every 6 (six) hours as needed for wheezing or shortness of breath.  75 mL  12  . budesonide-formoterol  (SYMBICORT) 160-4.5 MCG/ACT inhaler Inhale 2 puffs into the lungs 2 (two) times daily.        Marland Kitchen dexamethasone (DECADRON) 4 MG tablet Take 2 tab two times a day the day before chemo and the day after chemo  30 tablet  1  . diltiazem (CARDIZEM) 60 MG tablet Take 1 tablet (60 mg total) by mouth 2 (two) times daily.  60 tablet  6  . doxazosin (CARDURA) 2 MG tablet Take 2 mg by mouth daily.        . folic acid (FOLVITE) 1 MG tablet Take 1 tablet (1 mg total) by mouth daily. Take 1 day  100 tablet  3  . metFORMIN (GLUCOPHAGE) 500 MG tablet Take 500 mg by mouth 2 (two) times daily with a meal.       . simvastatin (ZOCOR) 40 MG tablet Take 0.5 tablets (20 mg total) by mouth at bedtime.  30 tablet  6  . warfarin (COUMADIN) 5 MG tablet Take 1 tablet (5 mg total) by mouth daily.  30 tablet  3  . lidocaine-prilocaine (EMLA) cream Apply 1 application topically as needed.  30 g  0  . ondansetron (ZOFRAN) 8 MG tablet Take 1 tablet (8 mg total) by mouth every 8 (eight) hours as needed.  30 tablet  1  . prochlorperazine (COMPAZINE) 10 MG tablet Take 1 tablet (10 mg total) by mouth every 6 (six) hours as  needed (Nausea or vomiting).  30 tablet  1   No current facility-administered medications for this encounter.    Physical Exam:  Filed Vitals:   11/28/13 1550  BP: 128/86  Pulse: 102  Temp: 97.7 F (36.5 C)  Resp: 22  Weight: 181 lb 6.4 oz (82.283 kg)  SpO2: 99%   well-appearing elderly male in no distress sitting comfortably on examining room table.  IMPRESSION: Oak is a 72 y.o. male with a T3 N0 versus T1 and T2 synchronous primaries of the right upper lobe and a 6 mm nodule in the left lower lobe  PLAN:  I spoke with the patient and his daughter today. We discussed his PET scan findings. We discussed that it's really difficult to tell what going on in the left lower lobe nodule. It really is too small to characterize and is in a location which would be very difficult to biopsy. I believe we should  give him the benefit of doubt of having to synchronous primaries especially given the low activity and PET and the fact that his biopsy showed an adenocarcinoma. He may have an indolent form of adenocarcinoma which may be slow-growing and have a propensity for multiple lung nodules. I think SBRT could target these 2 areas in the right upper lobe with stereotactic radiation and provide excellent local control. Would obviously not be addressing his hilum but again that lymph node is been stable from September to November although it is enlarged. It is also PET negative. I believe we could treat this area in the right upper lobe and monitor the left lower lobe lesion. If necessary we can treat that with stereotactic radiosurgery as well. The benefit of this is I believe we could treat him to the right upper lobe with minimal impact on his quality of life and his performance status.  It would allow him to care for his wife and provide treatment of his cancer. He does not have evidence of distant metastatic disease and therefore it would be difficult to monitor what his benefit from systemic chemotherapy would be. We discussed with him that radiation was a local only treatment and that he could in fact fail distantly which she seems to understand. He feels as though he has an excellent quality of life and doesn't have any symptoms right now and would prefer to wait on systemic treatment if possible. We discussed the process of simulation and the making a mold. We discussed the placement tattoos and the use of abdominal compression for respiratory motion management. I have scheduled him for simulation next Tuesday and we'll plan on starting after the first of the year. I discussed with him 5-10 treatments given the size of this lesion in the right upper lobe. I should also note that he is not interested in surgery. I discussed possible side effects with him and he signed informed consent. He asked that I contact Dr. Emeline Darling  stretching cancel his port placement and scheduled chemotherapy. I agree to do that. I've also asked that he be presented at lung cancer conference tomorrow morning.    Lurline Hare, MD

## 2013-11-28 NOTE — Progress Notes (Signed)
Please see the Nurse Progress Note in the MD Initial Consult Encounter for this patient. 

## 2013-11-28 NOTE — Telephone Encounter (Signed)
talked to pt, gave him chemo appts, he informed me that he does not want to do chemo right now, Dr. Michell Heinrich was supposed to talk to Dr. Marily Memos notifed

## 2013-11-29 ENCOUNTER — Telehealth: Payer: Self-pay | Admitting: *Deleted

## 2013-11-29 NOTE — Telephone Encounter (Signed)
Informed pt of appt for chemo and lab/Dr. Bertis Ruddy canceled along w/ appt for port a cath placement.  Instructed to keep his appts w/ Rad Onc as scheduled.  Instructed to resume coumadin as prescribed.  No need to hold coumadin since port a cath canceled.  Pt verbalized understanding.

## 2013-11-29 NOTE — Telephone Encounter (Signed)
Pt informed Scheduler that he has decided to not have chemotherapy for now,  He is going to have Radiation treatment.  He wants to cancel his chemo appt scheduled for this week on Fri 12/19.

## 2013-11-29 NOTE — Telephone Encounter (Signed)
Yes, please cancel his port, chemo, lab appt and appt to see me He has appt at Rad Onc, I tried to call him but unable to reach him Thanks

## 2013-11-30 ENCOUNTER — Ambulatory Visit: Payer: Medicare Other

## 2013-11-30 ENCOUNTER — Other Ambulatory Visit (HOSPITAL_COMMUNITY): Payer: Medicare Other

## 2013-11-30 NOTE — Addendum Note (Signed)
Encounter addended by: Delynn Flavin, RN on: 11/30/2013  3:37 PM<BR>     Documentation filed: Charges VN

## 2013-12-03 ENCOUNTER — Other Ambulatory Visit (HOSPITAL_COMMUNITY): Payer: Medicare Other

## 2013-12-03 ENCOUNTER — Ambulatory Visit (HOSPITAL_COMMUNITY): Payer: Medicare Other

## 2013-12-04 ENCOUNTER — Ambulatory Visit (INDEPENDENT_AMBULATORY_CARE_PROVIDER_SITE_OTHER): Payer: Medicare Other | Admitting: Pharmacist Clinician (PhC)/ Clinical Pharmacy Specialist

## 2013-12-04 VITALS — BP 160/86 | HR 64

## 2013-12-04 DIAGNOSIS — Z7901 Long term (current) use of anticoagulants: Secondary | ICD-10-CM

## 2013-12-04 DIAGNOSIS — I4891 Unspecified atrial fibrillation: Secondary | ICD-10-CM

## 2013-12-04 LAB — POCT INR: INR: 2.3

## 2013-12-05 ENCOUNTER — Emergency Department (HOSPITAL_COMMUNITY): Payer: Medicare Other

## 2013-12-05 ENCOUNTER — Emergency Department (HOSPITAL_COMMUNITY)
Admission: EM | Admit: 2013-12-05 | Discharge: 2013-12-05 | Disposition: A | Discharge status: Nursing Home (Includes ICF) | Payer: Medicare Other | Source: Home / Self Care | Attending: Family Medicine | Admitting: Family Medicine

## 2013-12-05 ENCOUNTER — Other Ambulatory Visit: Payer: Self-pay

## 2013-12-05 ENCOUNTER — Inpatient Hospital Stay (HOSPITAL_COMMUNITY)
Admission: EM | Admit: 2013-12-05 | Discharge: 2013-12-10 | DRG: 871 | Disposition: A | Discharge status: Home Health | Payer: Medicare Other | Attending: Internal Medicine | Admitting: Internal Medicine

## 2013-12-05 ENCOUNTER — Ambulatory Visit
Admission: RE | Admit: 2013-12-05 | Discharge: 2013-12-05 | Disposition: A | Discharge status: Home / Self Care | Payer: Medicare Other | Source: Ambulatory Visit | Attending: Radiation Oncology | Admitting: Radiation Oncology

## 2013-12-05 ENCOUNTER — Encounter (HOSPITAL_COMMUNITY): Payer: Self-pay | Admitting: Emergency Medicine

## 2013-12-05 ENCOUNTER — Emergency Department (INDEPENDENT_AMBULATORY_CARE_PROVIDER_SITE_OTHER): Payer: Medicare Other

## 2013-12-05 ENCOUNTER — Encounter: Payer: Self-pay | Admitting: Radiation Oncology

## 2013-12-05 VITALS — BP 106/67 | HR 75 | Temp 98.7°F | Resp 20

## 2013-12-05 DIAGNOSIS — A419 Sepsis, unspecified organism: Principal | ICD-10-CM | POA: Diagnosis present

## 2013-12-05 DIAGNOSIS — Z87891 Personal history of nicotine dependence: Secondary | ICD-10-CM

## 2013-12-05 DIAGNOSIS — C3491 Malignant neoplasm of unspecified part of right bronchus or lung: Secondary | ICD-10-CM

## 2013-12-05 DIAGNOSIS — E785 Hyperlipidemia, unspecified: Secondary | ICD-10-CM | POA: Diagnosis present

## 2013-12-05 DIAGNOSIS — E119 Type 2 diabetes mellitus without complications: Secondary | ICD-10-CM | POA: Diagnosis present

## 2013-12-05 DIAGNOSIS — R0602 Shortness of breath: Secondary | ICD-10-CM

## 2013-12-05 DIAGNOSIS — Z7901 Long term (current) use of anticoagulants: Secondary | ICD-10-CM

## 2013-12-05 DIAGNOSIS — D72829 Elevated white blood cell count, unspecified: Secondary | ICD-10-CM | POA: Diagnosis present

## 2013-12-05 DIAGNOSIS — J449 Chronic obstructive pulmonary disease, unspecified: Secondary | ICD-10-CM

## 2013-12-05 DIAGNOSIS — I4891 Unspecified atrial fibrillation: Secondary | ICD-10-CM

## 2013-12-05 DIAGNOSIS — Z9981 Dependence on supplemental oxygen: Secondary | ICD-10-CM

## 2013-12-05 DIAGNOSIS — J4489 Other specified chronic obstructive pulmonary disease: Secondary | ICD-10-CM | POA: Insufficient documentation

## 2013-12-05 DIAGNOSIS — C3490 Malignant neoplasm of unspecified part of unspecified bronchus or lung: Secondary | ICD-10-CM | POA: Insufficient documentation

## 2013-12-05 DIAGNOSIS — J159 Unspecified bacterial pneumonia: Secondary | ICD-10-CM

## 2013-12-05 DIAGNOSIS — Z510 Encounter for antineoplastic radiation therapy: Secondary | ICD-10-CM | POA: Insufficient documentation

## 2013-12-05 DIAGNOSIS — I428 Other cardiomyopathies: Secondary | ICD-10-CM | POA: Diagnosis present

## 2013-12-05 DIAGNOSIS — J189 Pneumonia, unspecified organism: Secondary | ICD-10-CM | POA: Diagnosis present

## 2013-12-05 DIAGNOSIS — J441 Chronic obstructive pulmonary disease with (acute) exacerbation: Secondary | ICD-10-CM | POA: Diagnosis present

## 2013-12-05 DIAGNOSIS — R05 Cough: Secondary | ICD-10-CM

## 2013-12-05 DIAGNOSIS — I429 Cardiomyopathy, unspecified: Secondary | ICD-10-CM | POA: Diagnosis present

## 2013-12-05 DIAGNOSIS — I10 Essential (primary) hypertension: Secondary | ICD-10-CM

## 2013-12-05 DIAGNOSIS — Z01810 Encounter for preprocedural cardiovascular examination: Secondary | ICD-10-CM

## 2013-12-05 DIAGNOSIS — Z79899 Other long term (current) drug therapy: Secondary | ICD-10-CM

## 2013-12-05 LAB — BASIC METABOLIC PANEL
CO2: 26 mEq/L (ref 19–32)
Calcium: 9.1 mg/dL (ref 8.4–10.5)
Chloride: 99 mEq/L (ref 96–112)
Creatinine, Ser: 1.01 mg/dL (ref 0.50–1.35)
Glucose, Bld: 150 mg/dL — ABNORMAL HIGH (ref 70–99)
Potassium: 3.9 mEq/L (ref 3.5–5.1)

## 2013-12-05 LAB — CBC
HCT: 40.9 % (ref 39.0–52.0)
Hemoglobin: 13.9 g/dL (ref 13.0–17.0)
MCH: 31.1 pg (ref 26.0–34.0)
MCV: 91.5 fL (ref 78.0–100.0)
Platelets: 188 10*3/uL (ref 150–400)
RBC: 4.47 MIL/uL (ref 4.22–5.81)
RDW: 13.7 % (ref 11.5–15.5)
WBC: 16.2 10*3/uL — ABNORMAL HIGH (ref 4.0–10.5)

## 2013-12-05 LAB — PROTIME-INR: INR: 2.45 — ABNORMAL HIGH (ref 0.00–1.49)

## 2013-12-05 LAB — GLUCOSE, CAPILLARY
Glucose-Capillary: 178 mg/dL — ABNORMAL HIGH (ref 70–99)
Glucose-Capillary: 174 mg/dL — ABNORMAL HIGH (ref 70–99)

## 2013-12-05 LAB — POCT I-STAT TROPONIN I: Troponin i, poc: 0.01 ng/mL (ref 0.00–0.08)

## 2013-12-05 MED ORDER — SIMVASTATIN 20 MG PO TABS
20.0000 mg | ORAL_TABLET | Freq: Every day | ORAL | Status: DC
  Administered 2013-12-06: 20 mg via ORAL
  Filled 2013-12-05 (×2): qty 1

## 2013-12-05 MED ORDER — DEXTROSE 5 % IV SOLN
2.0000 g | Freq: Three times a day (TID) | INTRAVENOUS | Status: DC
  Administered 2013-12-06 – 2013-12-10 (×14): 2 g via INTRAVENOUS
  Filled 2013-12-05 (×16): qty 2

## 2013-12-05 MED ORDER — DILTIAZEM HCL 25 MG/5ML IV SOLN
10.0000 mg | Freq: Four times a day (QID) | INTRAVENOUS | Status: DC | PRN
  Filled 2013-12-05: qty 5

## 2013-12-05 MED ORDER — INSULIN ASPART 100 UNIT/ML ~~LOC~~ SOLN
0.0000 [IU] | Freq: Three times a day (TID) | SUBCUTANEOUS | Status: DC
  Administered 2013-12-06: 5 [IU] via SUBCUTANEOUS

## 2013-12-05 MED ORDER — GLUCOSE 40 % PO GEL: 1.0000 | ORAL | Status: DC | PRN

## 2013-12-05 MED ORDER — ALBUTEROL SULFATE (5 MG/ML) 0.5% IN NEBU
2.5000 mg | INHALATION_SOLUTION | RESPIRATORY_TRACT | Status: DC | PRN
  Administered 2013-12-07 – 2013-12-10 (×9): 2.5 mg via RESPIRATORY_TRACT
  Filled 2013-12-05 (×10): qty 0.5

## 2013-12-05 MED ORDER — SODIUM CHLORIDE 0.9 % IV SOLN
INTRAVENOUS | Status: AC
  Administered 2013-12-05: 16:00:00 via INTRAVENOUS

## 2013-12-05 MED ORDER — IPRATROPIUM BROMIDE 0.02 % IN SOLN
0.5000 mg | Freq: Four times a day (QID) | RESPIRATORY_TRACT | Status: DC
  Administered 2013-12-06 (×3): 0.5 mg via RESPIRATORY_TRACT
  Filled 2013-12-05 (×3): qty 2.5

## 2013-12-05 MED ORDER — BUDESONIDE-FORMOTEROL FUMARATE 160-4.5 MCG/ACT IN AERO
2.0000 | INHALATION_SPRAY | Freq: Two times a day (BID) | RESPIRATORY_TRACT | Status: DC
  Administered 2013-12-06 – 2013-12-10 (×8): 2 via RESPIRATORY_TRACT
  Filled 2013-12-05 (×2): qty 6

## 2013-12-05 MED ORDER — VANCOMYCIN HCL IN DEXTROSE 1-5 GM/200ML-% IV SOLN
1000.0000 mg | Freq: Two times a day (BID) | INTRAVENOUS | Status: DC
  Administered 2013-12-06 – 2013-12-10 (×9): 1000 mg via INTRAVENOUS
  Filled 2013-12-05 (×10): qty 200

## 2013-12-05 MED ORDER — WARFARIN - PHARMACIST DOSING INPATIENT
Freq: Every day | Status: DC
  Administered 2013-12-07: 18:00:00

## 2013-12-05 MED ORDER — ALBUTEROL SULFATE (5 MG/ML) 0.5% IN NEBU
5.0000 mg | INHALATION_SOLUTION | Freq: Once | RESPIRATORY_TRACT | Status: AC
  Administered 2013-12-05: 5 mg via RESPIRATORY_TRACT
  Filled 2013-12-05: qty 1

## 2013-12-05 MED ORDER — DEXTROSE 50 % IV SOLN: 25.0000 mL | Freq: Once | INTRAVENOUS | Status: AC | PRN

## 2013-12-05 MED ORDER — CEFTRIAXONE SODIUM 1 G IJ SOLR
1.0000 g | Freq: Once | INTRAMUSCULAR | Status: AC
  Administered 2013-12-05: 1 g via INTRAVENOUS
  Filled 2013-12-05: qty 10

## 2013-12-05 MED ORDER — VANCOMYCIN HCL 10 G IV SOLR
1500.0000 mg | Freq: Once | INTRAVENOUS | Status: AC
  Administered 2013-12-05: 1500 mg via INTRAVENOUS
  Filled 2013-12-05: qty 1500

## 2013-12-05 MED ORDER — DOXAZOSIN MESYLATE 2 MG PO TABS
2.0000 mg | ORAL_TABLET | Freq: Every day | ORAL | Status: DC
  Administered 2013-12-06 – 2013-12-10 (×6): 2 mg via ORAL
  Filled 2013-12-05 (×6): qty 1

## 2013-12-05 MED ORDER — ALBUTEROL SULFATE HFA 108 (90 BASE) MCG/ACT IN AERS
2.0000 | INHALATION_SPRAY | Freq: Four times a day (QID) | RESPIRATORY_TRACT | Status: DC | PRN
  Filled 2013-12-05: qty 6.7

## 2013-12-05 MED ORDER — DILTIAZEM HCL 60 MG PO TABS
60.0000 mg | ORAL_TABLET | Freq: Two times a day (BID) | ORAL | Status: DC
  Administered 2013-12-06 (×3): 60 mg via ORAL
  Filled 2013-12-05 (×5): qty 1

## 2013-12-05 MED ORDER — DEXTROSE 50 % IV SOLN: 50.0000 mL | Freq: Once | INTRAVENOUS | Status: AC | PRN

## 2013-12-05 MED ORDER — SODIUM CHLORIDE 0.9 % IV SOLN
Freq: Once | INTRAVENOUS | Status: AC
  Administered 2013-12-05: 14:00:00 via INTRAVENOUS

## 2013-12-05 MED ORDER — FOLIC ACID 1 MG PO TABS
1.0000 mg | ORAL_TABLET | Freq: Every day | ORAL | Status: DC
  Administered 2013-12-06 – 2013-12-10 (×6): 1 mg via ORAL
  Filled 2013-12-05 (×6): qty 1

## 2013-12-05 MED ORDER — WARFARIN SODIUM 5 MG PO TABS
5.0000 mg | ORAL_TABLET | Freq: Once | ORAL | Status: AC
  Administered 2013-12-06: 5 mg via ORAL
  Filled 2013-12-05: qty 1

## 2013-12-05 MED ORDER — PROCHLORPERAZINE MALEATE 10 MG PO TABS
10.0000 mg | ORAL_TABLET | Freq: Four times a day (QID) | ORAL | Status: DC | PRN
  Filled 2013-12-05: qty 1

## 2013-12-05 MED ORDER — ONDANSETRON HCL 4 MG PO TABS: 8.0000 mg | ORAL_TABLET | Freq: Three times a day (TID) | ORAL | Status: DC | PRN

## 2013-12-05 MED ORDER — ALBUTEROL SULFATE (5 MG/ML) 0.5% IN NEBU
2.5000 mg | INHALATION_SOLUTION | Freq: Four times a day (QID) | RESPIRATORY_TRACT | Status: DC
  Administered 2013-12-05 – 2013-12-06 (×3): 2.5 mg via RESPIRATORY_TRACT
  Filled 2013-12-05 (×2): qty 0.5

## 2013-12-05 MED ORDER — PREDNISONE 20 MG PO TABS
40.0000 mg | ORAL_TABLET | Freq: Every day | ORAL | Status: DC
  Administered 2013-12-06: 40 mg via ORAL
  Filled 2013-12-05 (×2): qty 2

## 2013-12-05 MED ORDER — OSELTAMIVIR PHOSPHATE 75 MG PO CAPS
75.0000 mg | ORAL_CAPSULE | Freq: Two times a day (BID) | ORAL | Status: DC
  Administered 2013-12-06: 75 mg via ORAL
  Filled 2013-12-05 (×3): qty 1

## 2013-12-05 MED ORDER — DEXTROSE 5 % IV SOLN
500.0000 mg | Freq: Once | INTRAVENOUS | Status: AC
  Administered 2013-12-05: 500 mg via INTRAVENOUS

## 2013-12-05 MED ORDER — GUAIFENESIN ER 600 MG PO TB12
600.0000 mg | ORAL_TABLET | Freq: Two times a day (BID) | ORAL | Status: DC
  Administered 2013-12-06 – 2013-12-10 (×10): 600 mg via ORAL
  Filled 2013-12-05 (×11): qty 1

## 2013-12-05 NOTE — ED Provider Notes (Signed)
See prior note   Ward Givens, MD 12/05/13 [phone]

## 2013-12-05 NOTE — Progress Notes (Signed)
Weekly Management Note  Narrative:  Patient asked to be seen after simulation today. Increased shortness of breath, fever and chills last night. Fever responded to 3 tylenol. Cough this morning is stable for him. Feels some chest tightness like his airways are closing. Wife is doing better.   Physical Findings:  Feels warm. Wheezing on left side.Pursed lip breathing.   Vitals: See RN encounter.   Weight:  Wt Readings from Last 3 Encounters:  11/28/13 181 lb 6.4 oz (82.283 kg)  11/26/13 180 lb 1.6 oz (81.693 kg)  11/21/13 177 lb (80.287 kg)   Lab Results  Component Value Date   WBC 6.8 11/26/2013   HGB 13.6 11/26/2013   HCT 40.8 11/26/2013   MCV 91.5 11/26/2013   PLT 194 11/26/2013   Lab Results  Component Value Date   CREATININE 1.1 11/26/2013   BUN 10.1 11/26/2013   NA 139 11/26/2013   K 3.9 11/26/2013   CL 102 11/07/2013   CO2 25 11/26/2013     Impression:  Increased shortness of breath, fever and chills in a patient with COPD  Plan:  Contacted Dr. Thurston Hole office and they close at 12. Dr. Shelle Iron recommended he be seen at ER. Patient does not want to spend Christmas Eve in ER.  Contacted [facility] and they can see today. Discussed with his son. They will go over there right now to be seen and evaluated.

## 2013-12-05 NOTE — ED Notes (Signed)
Per EMS pt was c/o exertional dyspnea since last night at cancer center then sent to [facility] 96%/2L [place]

## 2013-12-05 NOTE — ED Provider Notes (Signed)
Patient has history of lung cancer. He is getting ready to start radiation therapy. He had a recent biopsy done followed by increasing cough with sputum production. He started having fevers yesterday with chills. He states he has shortness of breath. He states he felt better when he was placed on oxygen in the ED.  Patient appears tachypnic at rest. He is noted to have some mild diffuse wheezing.  Medical screening examination/treatment/procedure(s) were conducted as a shared visit with non-physician practitioner(s) and myself.  I personally evaluated the patient during the encounter.  EKG Interpretation   None        Devoria Albe, MD, Armando Gang   Ward Givens, MD 12/05/13 [phone]

## 2013-12-05 NOTE — ED Notes (Signed)
Notified [facility] 

## 2013-12-05 NOTE — ED Notes (Signed)
Went to get patient for CXR, in process of EKG

## 2013-12-05 NOTE — ED Notes (Addendum)
Spoke to [employer] from [facility] requesting to ask if pt is floor appropriate. CN made aware.

## 2013-12-05 NOTE — ED Notes (Signed)
Chest congestion, center chest discomfort, fever, increased sob .  Patient is preparing for cancer treatment.  Treatment center sent patient to this location for a chest xray

## 2013-12-05 NOTE — ED Provider Notes (Signed)
CSN: [id number]     Arrival date & time 12/05/13  1219 History   First MD Initiated Contact with Patient 12/05/13 1231     Chief Complaint  Patient presents with  . Shortness of Breath   (Consider location/radiation/quality/duration/timing/severity/associated sxs/prior Treatment) Patient is a 72 y.o. male presenting with shortness of breath. The history is provided by the patient.  Shortness of Breath Severity:  Moderate Onset quality:  Sudden Duration:  1 day Progression:  Worsening Chronicity:  Chronic Associated symptoms: fever   Risk factors comment:  Lung ca and h/o afib.   Past Medical History  Diagnosis Date  . Hyperlipidemia   . Type II or unspecified type diabetes mellitus without mention of complication, not stated as uncontrolled     twice/week  . Chronic airway obstruction, not elsewhere classified     HFA 25-50% 07-16-2010> 100% 01-28-2011; PFT's 08-13-10 FEV1 .93(34%) ratio 37 with 23% better p B2 DLCO 69%  . Shortness of breath   . Hypertension   . Arthritis     in hands  . Dysrhythmia     Atrial Fibrillation; takes Cardizem  . Lung cancer 11/06/2013    Diagnosed during bronchoscopy   Past Surgical History  Procedure Laterality Date  . Video bronchoscopy Bilateral 08/21/2013    Procedure: VIDEO BRONCHOSCOPY WITH FLUORO;  Surgeon: Nyoka Cowden, MD;  Location: [facility];  Service: Cardiopulmonary;  Laterality: Bilateral;  . 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 Cholecystectomy  1995  . Eye surgery Bilateral     cataracts  . Video bronchoscopy with endobronchial navigation N/A 10/31/2013    Procedure: VIDEO BRONCHOSCOPY WITH ENDOBRONCHIAL NAVIGATION;  Surgeon: Leslye Peer, MD;  Location: [facility] OR;  Service: Thoracic;  Laterality: N/A;  . Video bronchoscopy with endobronchial ultrasound N/A 10/31/2013    Procedure: VIDEO BRONCHOSCOPY WITH ENDOBRONCHIAL ULTRASOUND;  Surgeon: Leslye Peer, MD;  Location: [facility] OR;  Service:  Thoracic;  Laterality: N/A;  . Transthoracic echocardiogram  10/29/2013    Mild-moderately reduced EF, 40-45%. Diffuse HK. Mild MR. Mild LA/RA dilation; no determination of elevated pulmonary arterial pressures    Family History  Problem Relation Age of Onset  . Diabetes Father   . Diabetes Brother   . Diabetes Sister   . Lung cancer Sister   . Liver disease Sister   . Lung cancer Daughter   . Diabetes Daughter   . Hypertension Daughter   . Hyperlipidemia Daughter   . Heart murmur Daughter   . Hyperlipidemia Daughter   . Hypertension Daughter   . Cancer Daughter     lung cancer  . Cancer Sister     lung cancer   History  Substance Use Topics  . Smoking status: Former Smoker -- 1.50 packs/day for 40 years    Types: Cigarettes    Quit date: 12/13/2002  . Smokeless tobacco: Current User    Types: Chew  . Alcohol Use: No    Review of Systems  Constitutional: Positive for fever.  Respiratory: Positive for shortness of breath.   Cardiovascular: Negative for leg swelling.  Gastrointestinal: Negative.     Allergies  Review of patient's allergies indicates no known allergies.  Home Medications   Current Outpatient Rx  Name  Route  Sig  Dispense  Refill  . Aclidinium Bromide (TUDORZA PRESSAIR) 400 MCG/ACT AEPB   Inhalation   Inhale 1 puff into the lungs 2 (two) times daily.         Marland Kitchen  albuterol (PROVENTIL HFA;VENTOLIN HFA) 108 (90 BASE) MCG/ACT inhaler   Inhalation   Inhale 2 puffs into the lungs every 6 (six) hours as needed for wheezing or shortness of breath.          Marland Kitchen albuterol (PROVENTIL) (2.5 MG/3ML) 0.083% nebulizer solution   Nebulization   Take 3 mLs (2.5 mg total) by nebulization every 6 (six) hours as needed for wheezing or shortness of breath.   75 mL   12   . budesonide-formoterol (SYMBICORT) 160-4.5 MCG/ACT inhaler   Inhalation   Inhale 2 puffs into the lungs 2 (two) times daily.           Marland Kitchen diltiazem (CARDIZEM) 60 MG tablet   Oral   Take 1  tablet (60 mg total) by mouth 2 (two) times daily.   60 tablet   6   . doxazosin (CARDURA) 2 MG tablet   Oral   Take 2 mg by mouth daily.           . folic acid (FOLVITE) 1 MG tablet   Oral   Take 1 tablet (1 mg total) by mouth daily. Take 1 day   100 tablet   3   . metFORMIN (GLUCOPHAGE) 500 MG tablet   Oral   Take 500 mg by mouth 2 (two) times daily with a meal.          . ondansetron (ZOFRAN) 8 MG tablet   Oral   Take 1 tablet (8 mg total) by mouth every 8 (eight) hours as needed.   30 tablet   1   . prochlorperazine (COMPAZINE) 10 MG tablet   Oral   Take 1 tablet (10 mg total) by mouth every 6 (six) hours as needed (Nausea or vomiting).   30 tablet   1   . simvastatin (ZOCOR) 40 MG tablet   Oral   Take 0.5 tablets (20 mg total) by mouth at bedtime.   30 tablet   6   . warfarin (COUMADIN) 5 MG tablet   Oral   Take 1 tablet (5 mg total) by mouth daily.   30 tablet   3    BP 112/85  Pulse 110  Temp(Src) 97.8 F (36.6 C) (Oral)  Resp 18  SpO2 98% Physical Exam  Nursing note and vitals reviewed. Constitutional: He is oriented to person, place, and time. He appears cachectic. He is cooperative. He appears ill.  Neck: Normal range of motion. Neck supple.  Cardiovascular: Intact distal pulses and normal pulses.  An irregularly irregular rhythm present. Tachycardia present.   Pulmonary/Chest: Tachypnea noted. He is in respiratory distress. He has decreased breath sounds.  Lymphadenopathy:    He has no cervical adenopathy.  Neurological: He is alert and oriented to person, place, and time.  Skin: Skin is warm and dry.    ED Course  Procedures (including critical care time) Labs Review Labs Reviewed - No data to display Imaging Review Dg Chest 2 View  12/05/2013   CLINICAL DATA:  Labored breathing  EXAM: CHEST  2 VIEW  COMPARISON:  None.  FINDINGS: Normal heart size. No pleural effusion or edema. Right upper lobe pulmonary lesions are again noted. The  more central lesion measures 4.3 cm on today's exam. Previously 3.8 cm. Right lower lobe paramediastinal consolidation is identified and appears new from previous exam. Findings may reflect changes secondary to external beam radiation. The heart size and mediastinal contours are within normal limits. Both lungs are clear. The visualized skeletal structures are  unremarkable.  IMPRESSION: 1. New right lower lobe paramediastinal consolidation which may reflect changes secondary to external beam radiation 2. Increase in size of right upper lobe pulmonary mass.   Electronically Signed   By: Signa Kell M.D.   On: 12/05/2013 13:02    EKG Interpretation    Date/Time:    Ventricular Rate:    PR Interval:    QRS Duration:   QT Interval:    QTC Calculation:   R Axis:     Text Interpretation:              MDM  ecg-afib with rvr X-rays reviewed and report per radiologist.  Sent for medical stabilization of afib with rvr and sob/dyspnea, lung cancer     Linna Hoff, MD 12/05/13 1749

## 2013-12-05 NOTE — Progress Notes (Signed)
ANTICOAGULATION CONSULT NOTE - Initial Consult  Pharmacy Consult for Coumadin Indication: atrial fibrillation  No Known Allergies  Patient Measurements: Height: 5\' 7"  (170.2 cm) Weight: 175 lb 0.7 oz (79.4 kg) IBW/kg (Calculated) : 66.1  Vital Signs: Temp: 98.6 F (37 C) (12/24 1748) Temp src: Oral (12/24 1341) BP: 124/75 mmHg (12/24 1748) Pulse Rate: 109 (12/24 1748)  Labs:  Recent Labs  12/04/13 1131 12/05/13 1348 12/05/13 1423  HGB  --  13.9  --   HCT  --  40.9  --   PLT  --  188  --   LABPROT  --   --  25.8*  INR 2.3  --  2.45*  CREATININE  --  1.01  --     Estimated Creatinine Clearance: 64.8 ml/min (by C-G formula based on Cr of 1.01).   Medical History: Past Medical History  Diagnosis Date  . Hyperlipidemia   . Type II or unspecified type diabetes mellitus without mention of complication, not stated as uncontrolled     twice/week  . Chronic airway obstruction, not elsewhere classified     HFA 25-50% 07-16-2010> 100% 01-28-2011; PFT's 08-13-10 FEV1 .93(34%) ratio 37 with 23% better p B2 DLCO 69%  . Shortness of breath   . Hypertension   . Arthritis     in hands  . Dysrhythmia     Atrial Fibrillation; takes Cardizem  . Lung cancer 11/06/2013    Diagnosed during bronchoscopy    Medications:  Scheduled:  . sodium chloride   Intravenous STAT  . ceFEPime (MAXIPIME) IV  2 g Intravenous Q8H  . vancomycin  1,500 mg Intravenous Once   Followed by  . [START ON 12/06/2013] vancomycin  1,000 mg Intravenous Q12H   Assessment: 72 yo M on Coumadin PTA (reports 5mg  daily) for afib.  Pharmacy consulted to continue therapy.  Patients INR upon admission is therapeutic at 2.45, and he reports not having taken today's dose yet.  He has had a small amount of blood being coughed up since his bronchoscopy biopsy ~3 weeks ago, but no other reports of bleeding.  H/H and Plts are wnl. I spoke with patient and confirmed home dose and he says he's been on this dosage for ~30 days  now.  Patient is starting antibiotics for PNA which can potentially increase the effect of warfarin - will monitor.  Goal of Therapy:  INR 2-3 Monitor platelets by anticoagulation protocol: Yes   Plan:  - give Coumadin PO 5mg  x1 dose tonight - daily INR for now - monitor for s/s of bleeding - monitor for interacting medications  Harrold Donath E. Achilles Dunk, PharmD Clinical Pharmacist - Resident Pager: [phone] Pharmacy: [phone] 12/05/2013 6:08 PM

## 2013-12-05 NOTE — Progress Notes (Addendum)
Mr. Recendez escorted to nursing after his radiation therapy simulation appointment with c/o chills and increasing SOB since last pm.  Note marked decrease in breath sounds bilaterally. O2 sat 99% on room air.  Pulse 130 beats/min and irregular ( Hx Atrial fibrillation).  Upon 2nd set of vitals pulse rate decreased to 75 beats/min, but remained irregular. Afebrile at 98.7.  He states he does not "feel well" .  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  He was seen by Dr. Lurline Hare, Radiation Oncologist, and, as a result, patient sent to [facility] urgent care for assessment to avoid prolonged ED wait.  Accompanied by his son.  Spoke with Elnita Maxwell in urgent care.

## 2013-12-05 NOTE — Progress Notes (Signed)
ANTIBIOTIC CONSULT NOTE - INITIAL  Pharmacy Consult for vancomycin and cefepime Indication: pneumonia  No Known Allergies  Patient Measurements: Height: 5\' 6"  (167.6 cm) Weight: 176 lb (79.833 kg) IBW/kg (Calculated) : 63.8  Vital Signs: Temp: 98.5 F (36.9 C) (12/24 1341) Temp src: Oral (12/24 1341) BP: 105/70 mmHg (12/24 1715) Pulse Rate: 104 (12/24 1715)  Labs:  Recent Labs  12/05/13 1348  WBC 16.2*  HGB 13.9  PLT 188  CREATININE 1.01   Estimated Creatinine Clearance: 63.7 ml/min (by C-G formula based on Cr of 1.01). No results found for this basename: VANCOTROUGH, VANCOPEAK, VANCORANDOM, GENTTROUGH, GENTPEAK, GENTRANDOM, TOBRATROUGH, TOBRAPEAK, TOBRARND, AMIKACINPEAK, AMIKACINTROU, AMIKACIN,  in the last 72 hours   Microbiology: No results found for this or any previous visit (from the past 720 hour(s)).  Medical History: Past Medical History  Diagnosis Date  . Hyperlipidemia   . Type II or unspecified type diabetes mellitus without mention of complication, not stated as uncontrolled     twice/week  . Chronic airway obstruction, not elsewhere classified     HFA 25-50% 07-16-2010> 100% 01-28-2011; PFT's 08-13-10 FEV1 .93(34%) ratio 37 with 23% better p B2 DLCO 69%  . Shortness of breath   . Hypertension   . Arthritis     in hands  . Dysrhythmia     Atrial Fibrillation; takes Cardizem  . Lung cancer 11/06/2013    Diagnosed during bronchoscopy    Medications:  Infusions:  . sodium chloride 50 mL/hr at 12/05/13 1557   Assessment: 72 yo M with lung cancer (observation for past year) s/p biopsy done by bronchoscopy ~3 weeks ago and reports cough since with productive sputum and small amount of blood.  C/o fever on prior day with chills.  Patient has not received any chemo or radiation for cancer yet, but apparent plan was to start soon.  Pharmacy consulted to start vancomycin and cefepime for possible PNA.  He is currently afebrile with elevated WBC of 16.2.  Kidney  function of SCr ~1 with estimated CrCl ~64.    Goal of Therapy:  Resolution of possible infection Vancomycin trough level 15-20 mcg/ml  Plan:  - give vancomycin IV 1500mg  x1 dose, followed by 1000mg  q12h - plan to draw vancomycin trough at [facility] - start cefepime 2g q8h - monitor fever curve, WBC, kidney function, and clinical progression  Harrold Donath E. Achilles Dunk, PharmD Clinical Pharmacist - Resident Pager: [phone] Pharmacy: [phone] 12/05/2013 5:47 PM

## 2013-12-05 NOTE — ED Notes (Signed)
[facility] does not have a truck available 

## 2013-12-05 NOTE — H&P (Signed)
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 He also reported subjective fevers and chills. He reports productive cough with yellowish phlegm and a scanty blood-tinged sputum. He denies any nasal congestion or rhinorrhea, reports a mild headache, denies blurred vision, earache, sore throat, palpitations, orthopnea, PND or leg swelling.  he denies any nausea or vomiting, abdominal pain, bowel or urinary symptoms. He reports generalized malaise. Patient reports having cough since his bronchoscopy about 3 weeks back. He denies any sick contacts or recent travel. He reports getting a flu vaccine this season.  Course in the ED Patient was found to be tachycardic and tachypneic. Oxygen saturation was stable on room air. Blood work done showed leukocytosis with WBC of 16,000. Chemistry was normal. INR of 2.45. Chest x-ray done showed new right lower lobe pneumonia. Also showed increasing size of his right upper lobe mass. Condition given a dose of IV Rocephin and azithromycin, albuterol nebs X 1 and [facility] called for admission to telemetry.   Review of Systems:  Constitutional:  fever, chills, denies diaphoresis, appetite change and fatigue.  HEENT: Denies photophobia, eye pain, redness, hearing loss, ear pain, congestion, sore throat, rhinorrhea, sneezing, mouth sores, trouble swallowing, neck pain, neck stiffness and tinnitus.   Respiratory: SOB, DOE, cough, chest tightness,  and wheezing.   Cardiovascular: Denies  chest pain, palpitations and leg swelling.  Gastrointestinal: Denies nausea, vomiting, abdominal pain, diarrhea, constipation, blood in stool and abdominal distention.  Genitourinary: Denies dysuria, urgency, frequency, hematuria, flank pain and difficulty urinating.  Endocrine: Denies polyuria, polydipsia. Musculoskeletal: Denies myalgias, back pain, joint swelling, arthralgias and gait problem.  Skin: Denies pallor, rash and wound.  Neurological: Denies dizziness, seizures, syncope, weakness, light-headedness, numbness and headaches.  Hematological: Denies adenopathy. Reports Easy bruising Psychiatric/Behavioral: Denies  confusion, nervousness, sleep disturbance and agitation   Past Medical History  Diagnosis Date  . Hyperlipidemia   . Type II or unspecified type diabetes mellitus without mention of complication, not stated as uncontrolled     twice/week  . Chronic airway obstruction, not elsewhere classified     HFA 25-50% 07-16-2010> 100% 01-28-2011; PFT's 08-13-10 FEV1 .93(34%) ratio 37 with 23% better p B2 DLCO 69%  . Shortness of breath   . Hypertension   . Arthritis     in hands  . Dysrhythmia     Atrial Fibrillation; takes Cardizem  . Lung cancer 11/06/2013    Diagnosed during bronchoscopy   Past Surgical History  Procedure Laterality Date  . Video bronchoscopy Bilateral 08/21/2013    Procedure: VIDEO BRONCHOSCOPY WITH FLUORO;  Surgeon: Nyoka Cowden, MD;  Location: [facility];  Service: Cardiopulmonary;  Laterality: Bilateral;  . 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 Cholecystectomy  1995  . Eye surgery Bilateral     cataracts  . Video bronchoscopy with endobronchial navigation N/A 10/31/2013    Procedure: VIDEO BRONCHOSCOPY WITH ENDOBRONCHIAL NAVIGATION;  Surgeon: Leslye Peer, MD;  Location: [facility] OR;  Service: Thoracic;  Laterality: N/A;  . Video bronchoscopy  with endobronchial ultrasound N/A 10/31/2013    Procedure: VIDEO  BRONCHOSCOPY WITH ENDOBRONCHIAL ULTRASOUND;  Surgeon: Leslye Peer, MD;  Location: [facility] OR;  Service: Thoracic;  Laterality: N/A;  . Transthoracic echocardiogram  10/29/2013    Mild-moderately reduced EF, 40-45%. Diffuse HK. Mild MR. Mild LA/RA dilation; no determination of elevated pulmonary arterial pressures    Social History:  reports that he quit smoking about 10 years ago. His smoking use included Cigarettes. He has a 60 pack-year smoking history. His smokeless tobacco use includes Chew. He reports that he does not drink alcohol or use illicit drugs.  No Known Allergies  Family History  Problem Relation Age of Onset  . Diabetes Father   . Diabetes Brother   . Diabetes Sister   . Lung cancer Sister   . Liver disease Sister   . Lung cancer Daughter   . Diabetes Daughter   . Hypertension Daughter   . Hyperlipidemia Daughter   . Heart murmur Daughter   . Hyperlipidemia Daughter   . Hypertension Daughter   . Cancer Daughter     lung cancer  . Cancer Sister     lung cancer    Prior to Admission medications   Medication Sig Start Date End Date Taking? Authorizing Provider  Aclidinium Bromide (TUDORZA PRESSAIR) 400 MCG/ACT AEPB Inhale 1 puff into the lungs 2 (two) times daily.   Yes Historical Provider, MD  albuterol (PROVENTIL HFA;VENTOLIN HFA) 108 (90 BASE) MCG/ACT inhaler Inhale 2 puffs into the lungs every 6 (six) hours as needed for wheezing or shortness of breath.    Yes Historical Provider, MD  albuterol (PROVENTIL) (2.5 MG/3ML) 0.083% nebulizer solution Take 3 mLs (2.5 mg total) by nebulization every 6 (six) hours as needed for wheezing or shortness of breath. 11/07/13  Yes Nyoka Cowden, MD  budesonide-formoterol [facility]) 160-4.5 MCG/ACT inhaler Inhale 2 puffs into the lungs 2 (two) times daily.     Yes Historical Provider, MD  diltiazem (CARDIZEM) 60 MG tablet Take 1 tablet (60 mg total) by mouth 2 (two) times daily. 10/29/13  Yes Marykay Lex, MD  doxazosin  (CARDURA) 2 MG tablet Take 2 mg by mouth daily.     Yes Historical Provider, MD  folic acid (FOLVITE) 1 MG tablet Take 1 tablet (1 mg total) by mouth daily. Take 1 day 11/26/13  Yes Artis Delay, MD  metFORMIN (GLUCOPHAGE) 500 MG tablet Take 500 mg by mouth 2 (two) times daily with a meal.    Yes Historical Provider, MD  ondansetron (ZOFRAN) 8 MG tablet Take 1 tablet (8 mg total) by mouth every 8 (eight) hours as needed. 11/26/13  Yes Artis Delay, MD  prochlorperazine (COMPAZINE) 10 MG tablet Take 1 tablet (10 mg total) by mouth every 6 (six) hours as needed (Nausea or vomiting). 11/26/13  Yes Artis Delay, MD  simvastatin (ZOCOR) 40 MG tablet Take 0.5 tablets (20 mg total) by mouth at bedtime. 10/29/13  Yes Marykay Lex, MD  warfarin (COUMADIN) 5 MG tablet Take 1 tablet (5 mg total) by mouth daily. 11/01/13  Yes Phillips Hay, RPH-CPP    Physical Exam:  Filed Vitals:   12/05/13 1530 12/05/13 1545 12/05/13 1600 12/05/13 1630  BP: 114/81 130/84 111/64 117/80  Pulse: 110 121 106 98  Temp:      TempSrc:      Resp: 24 23    Height:      Weight:      SpO2: 97% 96% 94% 93%  Constitutional: Vital signs reviewed.  Patient is an elderly male lying in bed in no acute distress HEENT: Pallor, no icterus, moist oral mucosa, no cervical lymphadenopathy Chest: Diffuse rhonchi and wheeze bilaterally, right >left, no crackles. Not using accessory muscles of respiration Cardiovascular: S1 and S2 is regularly regular, no murmurs rub or gallop Abdomen: Soft, nontender, nondistended, bowel sounds present Extremities: Warm, no edema CNS: AAO x3  Labs on Admission:  Basic Metabolic Panel:  Recent Labs Lab 12/05/13 1348  NA 137  K 3.9  CL 99  CO2 26  GLUCOSE 150*  BUN 14  CREATININE 1.01  CALCIUM 9.1   Liver Function Tests: No results found for this basename: AST, ALT, ALKPHOS, BILITOT, PROT, ALBUMIN,  in the last 168 hours No results found for this basename: LIPASE, AMYLASE,  in the last  168 hours No results found for this basename: AMMONIA,  in the last 168 hours CBC:  Recent Labs Lab 12/05/13 1348  WBC 16.2*  HGB 13.9  HCT 40.9  MCV 91.5  PLT 188   Cardiac Enzymes: No results found for this basename: CKTOTAL, CKMB, CKMBINDEX, TROPONINI,  in the last 168 hours BNP: No components found with this basename: POCBNP,  CBG: No results found for this basename: GLUCAP,  in the last 168 hours  Radiological Exams on Admission: Dg Chest 2 View  12/05/2013   CLINICAL DATA:  Labored breathing  EXAM: CHEST  2 VIEW  COMPARISON:  None.  FINDINGS: Normal heart size. No pleural effusion or edema. Right upper lobe pulmonary lesions are again noted. The more central lesion measures 4.3 cm on today's exam. Previously 3.8 cm. Right lower lobe paramediastinal consolidation is identified and appears new from previous exam. Findings may reflect changes secondary to external beam radiation. The heart size and mediastinal contours are within normal limits. Both lungs are clear. The visualized skeletal structures are unremarkable.  IMPRESSION: 1. New right lower lobe paramediastinal consolidation which may reflect changes secondary to external beam radiation 2. Increase in size of right upper lobe pulmonary mass.   Electronically Signed   By: Signa Kell M.D.   On: 12/05/2013 13:02    EKG: A. fib at 129, no ST-T changes  Assessment/Plan  Principal Problem:   SIRS with HCAP (healthcare-associated pneumonia) Given findings  Of SIRS on patient with advanced lung ca will treat as HCAP with empiric vancomycin and cefepime  follow blood cx, sputum cx, urine strep ag and legionella ag Supportive care with tylenol and antitussives -Check for influenza PCR. We'll place on empiric Tamiflu  Active Problems: COPD exacerbation Patient has underlying gold stage  COPD. Continue O2 via nasal cannula. Scheduled and when necessary albuterol and Atrovent nebs. Oral prednisone 40 mg daily.    DM Cord  metformin. We'll place on sliding scale insulin       Adenocarcinoma, lung stage IV  plan for palliative chemoradiation. patient refusing chemotherapy for now. Plan for radiation therapy.follows with Dr Bertis Ruddy. i will add her in treatment team     Atrial fibrillation with controlled ventricular response Continue warfarin. Dose  or pharmacy. Add IV cardizem prn for rate control     Essential hypertension Stable  Hyperlipidemia  continue statin     Diet: Diabetic  DVT prophylaxis: On warfarin Code Status:  Full Code Family Communication: Son at bedside Disposition Plan: Home once improved  Eddie North [facility] Pager [phone]  If 7PM-7AM, please contact night-coverage [email or web address] Password [facility] 12/05/2013, 4:49 PM  Total time spent: 70  minutes

## 2013-12-05 NOTE — ED Provider Notes (Signed)
CSN: [id number]     Arrival date & time 12/05/13  1334 History   First MD Initiated Contact with Patient 12/05/13 1359     Chief Complaint  Patient presents with  . Shortness of Breath   (Consider location/radiation/quality/duration/timing/severity/associated sxs/prior Treatment) Patient is a 72 y.o. male presenting with shortness of breath. The history is provided by the patient and a significant other. No language interpreter was used.  Shortness of Breath Severity:  Moderate Onset quality:  Gradual Duration:  1 week Associated symptoms: chest pain, cough and wheezing   Associated symptoms: no abdominal pain, no headaches and no rash   Associated symptoms comment:  The patient presents with cough and shortness of breath for the past week, with fever at home yesterday described as significant chills. He has been diagnosed with lung cancer that has been under observation for the past year, now getting ready to start chemo and radiation therapy. He reports having a biopsy done by bronchoscopy 3 weeks ago and having a cough since that time, productive of sputum with a small amount of blood. He reports that he was seen by Urgent care prior to arrival who sent him here for further management.    Past Medical History  Diagnosis Date  . Hyperlipidemia   . Type II or unspecified type diabetes mellitus without mention of complication, not stated as uncontrolled     twice/week  . Chronic airway obstruction, not elsewhere classified     HFA 25-50% 07-16-2010> 100% 01-28-2011; PFT's 08-13-10 FEV1 .93(34%) ratio 37 with 23% better p B2 DLCO 69%  . Shortness of breath   . Hypertension   . Arthritis     in hands  . Dysrhythmia     Atrial Fibrillation; takes Cardizem  . Lung cancer 11/06/2013    Diagnosed during bronchoscopy   Past Surgical History  Procedure Laterality Date  . Video bronchoscopy Bilateral 08/21/2013    Procedure: VIDEO BRONCHOSCOPY WITH FLUORO;  Surgeon: Nyoka Cowden, MD;   Location: [facility];  Service: Cardiopulmonary;  Laterality: Bilateral;  . 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 Cholecystectomy  1995  . Eye surgery Bilateral     cataracts  . Video bronchoscopy with endobronchial navigation N/A 10/31/2013    Procedure: VIDEO BRONCHOSCOPY WITH ENDOBRONCHIAL NAVIGATION;  Surgeon: Leslye Peer, MD;  Location: [facility] OR;  Service: Thoracic;  Laterality: N/A;  . Video bronchoscopy with endobronchial ultrasound N/A 10/31/2013    Procedure: VIDEO BRONCHOSCOPY WITH ENDOBRONCHIAL ULTRASOUND;  Surgeon: Leslye Peer, MD;  Location: [facility] OR;  Service: Thoracic;  Laterality: N/A;  . Transthoracic echocardiogram  10/29/2013    Mild-moderately reduced EF, 40-45%. Diffuse HK. Mild MR. Mild LA/RA dilation; no determination of elevated pulmonary arterial pressures    Family History  Problem Relation Age of Onset  . Diabetes Father   . Diabetes Brother   . Diabetes Sister   . Lung cancer Sister   . Liver disease Sister   . Lung cancer Daughter   . Diabetes Daughter   . Hypertension Daughter   . Hyperlipidemia Daughter   . Heart murmur Daughter   . Hyperlipidemia Daughter   . Hypertension Daughter   . Cancer Daughter     lung cancer  . Cancer Sister     lung cancer   History  Substance Use Topics  . Smoking status: Former Smoker -- 1.50 packs/day for 40 years    Types: Cigarettes  Quit date: 12/13/2002  . Smokeless tobacco: Current User    Types: Chew  . Alcohol Use: No    Review of Systems  Constitutional: Positive for chills.  HENT: Positive for congestion.   Respiratory: Positive for cough, chest tightness, shortness of breath and wheezing.   Cardiovascular: Positive for chest pain.  Gastrointestinal: Negative for nausea and abdominal pain.  Musculoskeletal: Negative for myalgias.  Skin: Negative for rash.  Neurological: Negative for weakness and headaches.  Hematological: Bruises/bleeds easily.   Psychiatric/Behavioral: Negative for confusion.    Allergies  Review of patient's allergies indicates no known allergies.  Home Medications   Current Outpatient Rx  Name  Route  Sig  Dispense  Refill  . Aclidinium Bromide (TUDORZA PRESSAIR) 400 MCG/ACT AEPB   Inhalation   Inhale 1 puff into the lungs 2 (two) times daily.         Marland Kitchen albuterol (PROVENTIL HFA;VENTOLIN HFA) 108 (90 BASE) MCG/ACT inhaler   Inhalation   Inhale 2 puffs into the lungs every 6 (six) hours as needed for wheezing or shortness of breath.          Marland Kitchen albuterol (PROVENTIL) (2.5 MG/3ML) 0.083% nebulizer solution   Nebulization   Take 3 mLs (2.5 mg total) by nebulization every 6 (six) hours as needed for wheezing or shortness of breath.   75 mL   12   . budesonide-formoterol (SYMBICORT) 160-4.5 MCG/ACT inhaler   Inhalation   Inhale 2 puffs into the lungs 2 (two) times daily.           Marland Kitchen diltiazem (CARDIZEM) 60 MG tablet   Oral   Take 1 tablet (60 mg total) by mouth 2 (two) times daily.   60 tablet   6   . doxazosin (CARDURA) 2 MG tablet   Oral   Take 2 mg by mouth daily.           . folic acid (FOLVITE) 1 MG tablet   Oral   Take 1 tablet (1 mg total) by mouth daily. Take 1 day   100 tablet   3   . metFORMIN (GLUCOPHAGE) 500 MG tablet   Oral   Take 500 mg by mouth 2 (two) times daily with a meal.          . ondansetron (ZOFRAN) 8 MG tablet   Oral   Take 1 tablet (8 mg total) by mouth every 8 (eight) hours as needed.   30 tablet   1   . prochlorperazine (COMPAZINE) 10 MG tablet   Oral   Take 1 tablet (10 mg total) by mouth every 6 (six) hours as needed (Nausea or vomiting).   30 tablet   1   . simvastatin (ZOCOR) 40 MG tablet   Oral   Take 0.5 tablets (20 mg total) by mouth at bedtime.   30 tablet   6   . warfarin (COUMADIN) 5 MG tablet   Oral   Take 1 tablet (5 mg total) by mouth daily.   30 tablet   3    BP 117/79  Pulse 115  Temp(Src) 98.5 F (36.9 C) (Oral)   Resp 27  Ht 5\' 6"  (1.676 m)  Wt 176 lb (79.833 kg)  BMI 28.42 kg/m2  SpO2 99% Physical Exam  Constitutional: He is oriented to person, place, and time. He appears well-developed and well-nourished.  HENT:  Head: Normocephalic.  Mouth/Throat: Oropharynx is clear and moist.  Neck: Normal range of motion. Neck supple.  Cardiovascular: Normal rate and  regular rhythm.   Pulmonary/Chest: Effort normal. He has wheezes. He exhibits no tenderness.  Decrease air movement on right. Scattered rales diffusely.  Abdominal: Soft. Bowel sounds are normal. There is no tenderness. There is no rebound and no guarding.  Musculoskeletal: Normal range of motion. He exhibits no edema.  Neurological: He is alert and oriented to person, place, and time.  Skin: Skin is warm and dry. No rash noted.  Psychiatric: He has a normal mood and affect.    ED Course  Procedures (including critical care time) Labs Review Labs Reviewed  CBC - Abnormal; Notable for the following:    WBC 16.2 (*)    All other components within normal limits  PROTIME-INR - Abnormal; Notable for the following:    Prothrombin Time 25.8 (*)    INR 2.45 (*)    All other components within normal limits  BASIC METABOLIC PANEL  POCT I-STAT TROPONIN I   Imaging Review Dg Chest 2 View  12/05/2013   CLINICAL DATA:  Labored breathing  EXAM: CHEST  2 VIEW  COMPARISON:  None.  FINDINGS: Normal heart size. No pleural effusion or edema. Right upper lobe pulmonary lesions are again noted. The more central lesion measures 4.3 cm on today's exam. Previously 3.8 cm. Right lower lobe paramediastinal consolidation is identified and appears new from previous exam. Findings may reflect changes secondary to external beam radiation. The heart size and mediastinal contours are within normal limits. Both lungs are clear. The visualized skeletal structures are unremarkable.  IMPRESSION: 1. New right lower lobe paramediastinal consolidation which may reflect  changes secondary to external beam radiation 2. Increase in size of right upper lobe pulmonary mass.   Electronically Signed   By: Signa Kell M.D.   On: 12/05/2013 13:02    EKG Interpretation   None       MDM  No diagnosis found. 1. Pneumonia, Community Acquired 2. Lung cancer  The patient is tachycardic and tachypneic but afebrile, blood pressure 117 systolic and appears non-toxic. Doubt sepsis. CXR showing new consolidation in the setting of presenting symptoms and leukocytosis, will admit for pnuemonia. Patient reports 1-2 day admissions only in the last 3 months, will treat as CAP with rocephin and zithromax. Patient given nebulizer treatment here and feels better, less SOB. O2 saturation maintains upper 90's on room air.     Arnoldo Hooker, PA-C 12/05/13 1529

## 2013-12-05 NOTE — Progress Notes (Signed)
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  SIDE: right  CONSENT VERIFIED: yes  SET UP AND IMMOBILIZATION: Patient is setup supine in a vac loc with a custom moldable pillow for head and neck immobilization   NARRATIVE: The patient was brought to the CT Simulation planning suite.  Identity was confirmed.  All relevant records and images related to the planned course of therapy were reviewed.  Then, the patient was positioned in a stable reproducible clinical set-up for radiation therapy.  CT images were obtained.  Skin markings were placed.  A four dimensional simulation was then performed to track tumor movement throughout the patients' breathing cycle. The CT images were loaded into the planning software where the target and avoidance structures were contoured.  The GTV was outlined on the free breathing, 4D and MIP image sets.  The radiation prescription was entered and confirmed.   TREATMENT PLANNING NOTE:  Treatment planning then occurred. I have requested 3D simulation with [facility] of the spinal cord, total lungs and gross tumor volume. I have also requested mlcs and an isodose plan.   Special treatment procedure will be performed as Norina Buzzard will be receiving high dose per fraction.    Respiratory Motion Management: 4D CT was performed to account for respiratory motion of the tumors throughout the respiratory cycle.  These images were reconstructed and along with the MIP and planning CT images will be sued to create the ITV for treatment planning purposes. Abdominal compression was used with a paddle to prevent deep inspiration.

## 2013-12-05 NOTE — ED Notes (Addendum)
Spoke to dr. Cameron Ali- sts pt is able to go to tele bed inpatient.  Spoke to Company secretary and CN. Unable to put in verbal order for admit. MD called to place new admit order.

## 2013-12-06 DIAGNOSIS — I10 Essential (primary) hypertension: Secondary | ICD-10-CM

## 2013-12-06 LAB — GLUCOSE, CAPILLARY
Glucose-Capillary: 259 mg/dL — ABNORMAL HIGH (ref 70–99)
Glucose-Capillary: 240 mg/dL — ABNORMAL HIGH (ref 70–99)
Glucose-Capillary: 331 mg/dL — ABNORMAL HIGH (ref 70–99)
Glucose-Capillary: 372 mg/dL — ABNORMAL HIGH (ref 70–99)

## 2013-12-06 LAB — CBC
HCT: 36.2 % — ABNORMAL LOW (ref 39.0–52.0)
Hemoglobin: 12 g/dL — ABNORMAL LOW (ref 13.0–17.0)
MCH: 30.5 pg (ref 26.0–34.0)
MCHC: 33.1 g/dL (ref 30.0–36.0)
MCV: 92.1 fL (ref 78.0–100.0)
Platelets: 165 K/uL (ref 150–400)
RBC: 3.93 MIL/uL — ABNORMAL LOW (ref 4.22–5.81)
RDW: 13.7 % (ref 11.5–15.5)
WBC: 10.4 K/uL (ref 4.0–10.5)

## 2013-12-06 LAB — PROTIME-INR: INR: 2.17 — ABNORMAL HIGH (ref 0.00–1.49)

## 2013-12-06 LAB — INFLUENZA PANEL BY PCR (TYPE A & B)
H1N1 flu by pcr: NOT DETECTED
Influenza B By PCR: NEGATIVE

## 2013-12-06 LAB — EXPECTORATED SPUTUM ASSESSMENT W GRAM STAIN, RFLX TO RESP C

## 2013-12-06 LAB — STREP PNEUMONIAE URINARY ANTIGEN: Strep Pneumo Urinary Antigen: NEGATIVE

## 2013-12-06 MED ORDER — METHYLPREDNISOLONE SODIUM SUCC 125 MG IJ SOLR
80.0000 mg | Freq: Two times a day (BID) | INTRAMUSCULAR | Status: DC
  Administered 2013-12-06 (×2): 80 mg via INTRAVENOUS
  Filled 2013-12-06 (×4): qty 1.28

## 2013-12-06 MED ORDER — INSULIN ASPART 100 UNIT/ML ~~LOC~~ SOLN
0.0000 [IU] | Freq: Three times a day (TID) | SUBCUTANEOUS | Status: DC
  Administered 2013-12-06: 5 [IU] via SUBCUTANEOUS
  Administered 2013-12-06 – 2013-12-07 (×2): 11 [IU] via SUBCUTANEOUS
  Administered 2013-12-07 (×2): 8 [IU] via SUBCUTANEOUS
  Administered 2013-12-08: 11 [IU] via SUBCUTANEOUS
  Administered 2013-12-08: 8 [IU] via SUBCUTANEOUS
  Administered 2013-12-08: 15 [IU] via SUBCUTANEOUS
  Administered 2013-12-09 (×2): 11 [IU] via SUBCUTANEOUS
  Administered 2013-12-09 – 2013-12-10 (×2): 8 [IU] via SUBCUTANEOUS
  Administered 2013-12-10: 5 [IU] via SUBCUTANEOUS

## 2013-12-06 MED ORDER — INSULIN DETEMIR 100 UNIT/ML ~~LOC~~ SOLN
20.0000 [IU] | Freq: Every day | SUBCUTANEOUS | Status: DC
  Administered 2013-12-06: 20 [IU] via SUBCUTANEOUS
  Filled 2013-12-06 (×2): qty 0.2

## 2013-12-06 MED ORDER — INSULIN ASPART 100 UNIT/ML ~~LOC~~ SOLN
4.0000 [IU] | Freq: Three times a day (TID) | SUBCUTANEOUS | Status: DC
  Administered 2013-12-06 – 2013-12-10 (×13): 4 [IU] via SUBCUTANEOUS

## 2013-12-06 MED ORDER — IPRATROPIUM BROMIDE 0.02 % IN SOLN
0.5000 mg | Freq: Four times a day (QID) | RESPIRATORY_TRACT | Status: DC | PRN
  Administered 2013-12-07 – 2013-12-10 (×3): 0.5 mg via RESPIRATORY_TRACT
  Filled 2013-12-06 (×4): qty 2.5

## 2013-12-06 MED ORDER — INSULIN ASPART 100 UNIT/ML ~~LOC~~ SOLN
0.0000 [IU] | Freq: Every day | SUBCUTANEOUS | Status: DC
  Administered 2013-12-06 – 2013-12-07 (×2): 5 [IU] via SUBCUTANEOUS
  Administered 2013-12-08 – 2013-12-09 (×2): 3 [IU] via SUBCUTANEOUS

## 2013-12-06 MED ORDER — ATORVASTATIN CALCIUM 10 MG PO TABS
10.0000 mg | ORAL_TABLET | Freq: Every day | ORAL | Status: DC
  Administered 2013-12-06 – 2013-12-09 (×4): 10 mg via ORAL
  Filled 2013-12-06 (×5): qty 1

## 2013-12-06 MED ORDER — PREDNISONE 20 MG PO TABS
40.0000 mg | ORAL_TABLET | Freq: Every day | ORAL | Status: DC
  Filled 2013-12-06: qty 2

## 2013-12-06 NOTE — Progress Notes (Addendum)
[facility] PROGRESS NOTE Assessment/Plan: Sepsis/HCAP (healthcare-associated pneumonia) - Vanc and cefepime 12.24.2014. - BC and sputum cultures pending. - influenza PCR pending. - Increase in size of right upper lobe pulmonary mass.  Atrial fibrillation with controlled ventricular response - rate controlled, INR thx.  Essential hypertension - borderline low. Cont diltiazem  DM: - Hold metformin, cont SSI.  COPD GOLD III/IV - Continue O2 via nasal cannula. Scheduled and when necessary albuterol and Atrovent nebs.  - wheezing change steroids to IV.  Code Status: Full Code  Family Communication: Son at bedside  Disposition Plan: Home once improved    Consultants:  none  Procedures:  CXR  Antibiotics:  vanc cefepime  HPI/Subjective: Feels better no complains.  Objective: Filed Vitals:   12/05/13 1748 12/05/13 2037 12/06/13 0227 12/06/13 0624  BP: 124/75 103/69  103/63  Pulse: 109 102 89 80  Temp: 98.6 F (37 C) 99 F (37.2 C)  99.4 F (37.4 C)  TempSrc:  Oral  Oral  Resp: 23 22 22 22   Height: 5\' 7"  (1.702 m) 5\' 7"  (1.702 m)    Weight: 79.4 kg (175 lb 0.7 oz) 79.578 kg (175 lb 7 oz)    SpO2: 92% 96% 98% 98%    Intake/Output Summary (Last 24 hours) at 12/06/13 0808 Last data filed at 12/06/13 1610  Gross per 24 hour  Intake    240 ml  Output    500 ml  Net   -260 ml   Filed Weights   12/05/13 1341 12/05/13 1748 12/05/13 2037  Weight: 79.833 kg (176 lb) 79.4 kg (175 lb 0.7 oz) 79.578 kg (175 lb 7 oz)    Exam:  General: Alert, awake, oriented x3, in no acute distress.  HEENT: No bruits, no goiter.  Heart: Regular rate and rhythm, without murmurs, rubs, gallops.  Lungs: Good air movement, wheezing B/L Abdomen: Soft, nontender, nondistended, positive bowel sounds.  Neuro: Grossly intact, nonfocal.   Data Reviewed: Basic Metabolic Panel:  Recent Labs Lab 12/05/13 1348  NA 137  K 3.9  CL 99  CO2 26  GLUCOSE 150*  BUN 14   CREATININE 1.01  CALCIUM 9.1   Liver Function Tests: No results found for this basename: AST, ALT, ALKPHOS, BILITOT, PROT, ALBUMIN,  in the last 168 hours No results found for this basename: LIPASE, AMYLASE,  in the last 168 hours No results found for this basename: AMMONIA,  in the last 168 hours CBC:  Recent Labs Lab 12/05/13 1348 12/06/13 0600  WBC 16.2* 10.4  HGB 13.9 12.0*  HCT 40.9 36.2*  MCV 91.5 92.1  PLT 188 165   Cardiac Enzymes: No results found for this basename: CKTOTAL, CKMB, CKMBINDEX, TROPONINI,  in the last 168 hours BNP (last 3 results) No results found for this basename: PROBNP,  in the last 8760 hours CBG:  Recent Labs Lab 12/05/13 1750 12/05/13 2040 12/06/13 0752  GLUCAP 178* 174* 259*    Recent Results (from the past 240 hour(s))  CULTURE, EXPECTORATED SPUTUM-ASSESSMENT     Status: None   Collection Time    12/05/13  3:46 PM      Result Value Range Status   Specimen Description SPUTUM   Final   Special Requests Immunocompromised   Final   Sputum evaluation     Final   Value: MICROSCOPIC FINDINGS SUGGEST THAT THIS SPECIMEN IS NOT REPRESENTATIVE OF LOWER RESPIRATORY SECRETIONS. PLEASE RECOLLECT.   Report Status 12/06/2013 FINAL   Final     Studies: Dg Chest  2 View  12/05/2013   CLINICAL DATA:  Labored breathing  EXAM: CHEST  2 VIEW  COMPARISON:  None.  FINDINGS: Normal heart size. No pleural effusion or edema. Right upper lobe pulmonary lesions are again noted. The more central lesion measures 4.3 cm on today's exam. Previously 3.8 cm. Right lower lobe paramediastinal consolidation is identified and appears new from previous exam. Findings may reflect changes secondary to external beam radiation. The heart size and mediastinal contours are within normal limits. Both lungs are clear. The visualized skeletal structures are unremarkable.  IMPRESSION: 1. New right lower lobe paramediastinal consolidation which may reflect changes secondary to external  beam radiation 2. Increase in size of right upper lobe pulmonary mass.   Electronically Signed   By: Signa Kell M.D.   On: 12/05/2013 13:02    Scheduled Meds: . albuterol  2.5 mg Nebulization Q6H  . budesonide-formoterol  2 puff Inhalation BID  . ceFEPime (MAXIPIME) IV  2 g Intravenous Q8H  . diltiazem  60 mg Oral BID  . doxazosin  2 mg Oral Daily  . folic acid  1 mg Oral Daily  . guaiFENesin  600 mg Oral BID  . insulin aspart  0-9 Units Subcutaneous TID WC  . ipratropium  0.5 mg Nebulization Q6H  . oseltamivir  75 mg Oral BID  . predniSONE  40 mg Oral Q breakfast  . simvastatin  20 mg Oral QHS  . vancomycin  1,000 mg Intravenous Q12H  . warfarin  5 mg Oral ONCE-1800  . Warfarin - Pharmacist Dosing Inpatient   Does not apply q1800   Continuous Infusions:    Marinda Elk  [facility] Pager [phone]. If 8PM-8AM, please contact night-coverage at [email or web address], password [facility] 12/06/2013, 8:08 AM  LOS: 1 day

## 2013-12-07 LAB — GLUCOSE, CAPILLARY
Glucose-Capillary: 284 mg/dL — ABNORMAL HIGH (ref 70–99)
Glucose-Capillary: 339 mg/dL — ABNORMAL HIGH (ref 70–99)
Glucose-Capillary: 370 mg/dL — ABNORMAL HIGH (ref 70–99)

## 2013-12-07 LAB — LEGIONELLA ANTIGEN, URINE: Legionella Antigen, Urine: NEGATIVE

## 2013-12-07 LAB — PROTIME-INR: Prothrombin Time: 21.8 seconds — ABNORMAL HIGH (ref 11.6–15.2)

## 2013-12-07 MED ORDER — ALBUTEROL SULFATE HFA 108 (90 BASE) MCG/ACT IN AERS
2.0000 | INHALATION_SPRAY | Freq: Four times a day (QID) | RESPIRATORY_TRACT | Status: DC
  Administered 2013-12-07 (×2): 2 via RESPIRATORY_TRACT
  Filled 2013-12-07 (×2): qty 6.7

## 2013-12-07 MED ORDER — ZOLPIDEM TARTRATE 5 MG PO TABS
5.0000 mg | ORAL_TABLET | Freq: Every evening | ORAL | Status: DC | PRN
  Administered 2013-12-07: 5 mg via ORAL
  Filled 2013-12-07: qty 1

## 2013-12-07 MED ORDER — METOPROLOL TARTRATE 1 MG/ML IV SOLN
5.0000 mg | Freq: Four times a day (QID) | INTRAVENOUS | Status: DC | PRN
  Filled 2013-12-07: qty 5

## 2013-12-07 MED ORDER — ZOLPIDEM TARTRATE 5 MG PO TABS
5.0000 mg | ORAL_TABLET | Freq: Every evening | ORAL | Status: DC | PRN
  Administered 2013-12-07 – 2013-12-09 (×3): 5 mg via ORAL
  Filled 2013-12-07 (×3): qty 1

## 2013-12-07 MED ORDER — DILTIAZEM HCL 60 MG PO TABS
60.0000 mg | ORAL_TABLET | Freq: Four times a day (QID) | ORAL | Status: DC
  Administered 2013-12-07 – 2013-12-10 (×13): 60 mg via ORAL
  Filled 2013-12-07 (×16): qty 1

## 2013-12-07 MED ORDER — ZOLPIDEM TARTRATE 5 MG PO TABS: 10.0000 mg | ORAL_TABLET | Freq: Every evening | ORAL | Status: DC | PRN

## 2013-12-07 MED ORDER — METHYLPREDNISOLONE SODIUM SUCC 125 MG IJ SOLR
125.0000 mg | Freq: Three times a day (TID) | INTRAMUSCULAR | Status: DC
  Administered 2013-12-07 – 2013-12-09 (×6): 125 mg via INTRAVENOUS
  Filled 2013-12-07 (×10): qty 2

## 2013-12-07 MED ORDER — WARFARIN SODIUM 5 MG PO TABS
5.0000 mg | ORAL_TABLET | Freq: Once | ORAL | Status: AC
  Administered 2013-12-07: 5 mg via ORAL
  Filled 2013-12-07: qty 1

## 2013-12-07 MED ORDER — INSULIN DETEMIR 100 UNIT/ML ~~LOC~~ SOLN
40.0000 [IU] | Freq: Every day | SUBCUTANEOUS | Status: DC
  Administered 2013-12-07: 40 [IU] via SUBCUTANEOUS
  Filled 2013-12-07 (×2): qty 0.4

## 2013-12-07 NOTE — Progress Notes (Addendum)
[facility] PROGRESS NOTE Assessment/Plan: Sepsis/HCAP (healthcare-associated pneumonia) - Vanc and cefepime 12.24.2014. - BC and sputum cultures pending. - influenza PCR negative, d/c tamiflu. - Increase in size of right upper lobe pulmonary mass. - Cont Iv steroids.  Atrial fibrillation with controlled ventricular response - With RVR chang diltiazem to q 6hrs add metoprolol IV. INR thx.  Essential hypertension - borderline low. Cont diltiazem  DM: - Hold metformin, cont SSI.  COPD GOLD III/IV - Continue O2 via nasal cannula. Scheduled and when necessary albuterol and Atrovent nebs.  - wheezing change steroids to IV.  Code Status: Full Code  Family Communication: Son at bedside  Disposition Plan: Home once improved    Consultants:  none  Procedures:  CXR  Antibiotics:  vanc cefepime  HPI/Subjective: SOB is unchaged  Objective: Filed Vitals:   12/06/13 2053 12/06/13 2143 12/07/13 0502 12/07/13 0759  BP: 134/80  141/92 123/96  Pulse: 128  98 99  Temp: 97.9 F (36.6 C)  97.8 F (36.6 C) 97.9 F (36.6 C)  TempSrc: Oral  Oral Oral  Resp: 18  17 18   Height:  5\' 7"  (1.702 m)    Weight:  80.74 kg (178 lb)    SpO2: 96%  95% 97%    Intake/Output Summary (Last 24 hours) at 12/07/13 0818 Last data filed at 12/07/13 5409  Gross per 24 hour  Intake   1500 ml  Output   3725 ml  Net  -2225 ml   Filed Weights   12/05/13 1748 12/05/13 2037 12/06/13 2143  Weight: 79.4 kg (175 lb 0.7 oz) 79.578 kg (175 lb 7 oz) 80.74 kg (178 lb)    Exam:  General: Alert, awake, oriented x3, in no acute distress.  HEENT: No bruits, no goiter.  Heart: Regular rate and rhythm, without murmurs, rubs, gallops.  Lungs: moderate air movement , wheezing B/L Abdomen: Soft, nontender, nondistended, positive bowel sounds.  Neuro: Grossly intact, nonfocal.   Data Reviewed: Basic Metabolic Panel:  Recent Labs Lab 12/05/13 1348  NA 137  K 3.9  CL 99  CO2 26  GLUCOSE  150*  BUN 14  CREATININE 1.01  CALCIUM 9.1   Liver Function Tests: No results found for this basename: AST, ALT, ALKPHOS, BILITOT, PROT, ALBUMIN,  in the last 168 hours No results found for this basename: LIPASE, AMYLASE,  in the last 168 hours No results found for this basename: AMMONIA,  in the last 168 hours CBC:  Recent Labs Lab 12/05/13 1348 12/06/13 0600  WBC 16.2* 10.4  HGB 13.9 12.0*  HCT 40.9 36.2*  MCV 91.5 92.1  PLT 188 165   Cardiac Enzymes: No results found for this basename: CKTOTAL, CKMB, CKMBINDEX, TROPONINI,  in the last 168 hours BNP (last 3 results) No results found for this basename: PROBNP,  in the last 8760 hours CBG:  Recent Labs Lab 12/05/13 2040 12/06/13 0752 12/06/13 1156 12/06/13 1621 12/06/13 2049  GLUCAP 174* 259* 240* 331* 372*    Recent Results (from the past 240 hour(s))  CULTURE, EXPECTORATED SPUTUM-ASSESSMENT     Status: None   Collection Time    12/05/13  3:46 PM      Result Value Range Status   Specimen Description SPUTUM   Final   Special Requests Immunocompromised   Final   Sputum evaluation     Final   Value: MICROSCOPIC FINDINGS SUGGEST THAT THIS SPECIMEN IS NOT REPRESENTATIVE OF LOWER RESPIRATORY SECRETIONS. PLEASE RECOLLECT.   Report Status 12/06/2013 FINAL   Final  Studies: Dg Chest 2 View  12/05/2013   CLINICAL DATA:  Labored breathing  EXAM: CHEST  2 VIEW  COMPARISON:  None.  FINDINGS: Normal heart size. No pleural effusion or edema. Right upper lobe pulmonary lesions are again noted. The more central lesion measures 4.3 cm on today's exam. Previously 3.8 cm. Right lower lobe paramediastinal consolidation is identified and appears new from previous exam. Findings may reflect changes secondary to external beam radiation. The heart size and mediastinal contours are within normal limits. Both lungs are clear. The visualized skeletal structures are unremarkable.  IMPRESSION: 1. New right lower lobe paramediastinal  consolidation which may reflect changes secondary to external beam radiation 2. Increase in size of right upper lobe pulmonary mass.   Electronically Signed   By: Signa Kell M.D.   On: 12/05/2013 13:02    Scheduled Meds: . atorvastatin  10 mg Oral q1800  . budesonide-formoterol  2 puff Inhalation BID  . ceFEPime (MAXIPIME) IV  2 g Intravenous Q8H  . diltiazem  60 mg Oral BID  . doxazosin  2 mg Oral Daily  . folic acid  1 mg Oral Daily  . guaiFENesin  600 mg Oral BID  . insulin aspart  0-15 Units Subcutaneous TID WC  . insulin aspart  0-5 Units Subcutaneous QHS  . insulin aspart  4 Units Subcutaneous TID WC  . insulin detemir  20 Units Subcutaneous QHS  . methylPREDNISolone (SOLU-MEDROL) injection  80 mg Intravenous Q12H  . [START ON 12/08/2013] predniSONE  40 mg Oral Q breakfast  . vancomycin  1,000 mg Intravenous Q12H  . Warfarin - Pharmacist Dosing Inpatient   Does not apply q1800   Continuous Infusions:    Marinda Elk  [facility] Pager [phone]. If 8PM-8AM, please contact night-coverage at [email or web address], password [facility] 12/07/2013, 8:18 AM  LOS: 2 days

## 2013-12-07 NOTE — Progress Notes (Signed)
Pt with dyspnea at rest, requesting breathing tx. PRN neb given, pt with elevated HR in 140s-150s with afib per CMT. MD Feliz-Ortiz notified via text page. Will continue to monitor.

## 2013-12-07 NOTE — Progress Notes (Signed)
ANTICOAGULATION CONSULT NOTE - Follow Up Consult  Pharmacy Consult for Warfarin Indication: Hx Afib  No Known Allergies  Patient Measurements: Height: 5\' 7"  (170.2 cm) Weight: 178 lb (80.74 kg) IBW/kg (Calculated) : 66.1  Vital Signs: Temp: 97.9 F (36.6 C) (12/26 0759) Temp src: Oral (12/26 0759) BP: 123/96 mmHg (12/26 0759) Pulse Rate: 99 (12/26 0759)  Labs:  Recent Labs  12/05/13 1348 12/05/13 1423 12/06/13 0600 12/07/13 0519  HGB 13.9  --  12.0*  --   HCT 40.9  --  36.2*  --   PLT 188  --  165  --   LABPROT  --  25.8* 23.5* 21.8*  INR  --  2.45* 2.17* 1.97*  CREATININE 1.01  --   --   --     Estimated Creatinine Clearance: 65.3 ml/min (by C-G formula based on Cr of 1.01).   Assessment: 72 y.o. M who continues on warfarin from PTA for hx Afib. Dose missed on 12/24 due to being entered for 12/25 instead, given yesterday evening. INR slightly SUBtherapeutic this morning (INR 1.97 << 2.17). The patient was noted to start on IV steroids on 12/25 which will increase warfarin sensitivity. Will monitor closely. No CBC today, stable from 12/25 . No overt s/sx of bleeding noted.   Goal of Therapy:  INR 2-3   Plan:  1. Warfarin 5 mg x 1 dose at 1800 today 2. Will continue to monitor for any signs/symptoms of bleeding and will follow up with PT/INR in the a.m.   Georgina Pillion, PharmD, BCPS Clinical Pharmacist Pager: [phone] 12/07/2013 8:26 AM

## 2013-12-07 NOTE — Progress Notes (Signed)
Pt HR in the 160s to 170s with activity, 130s-140s at rest. MD is aware, instructed pt to remain on bedrest until HR with better control. Pt with DOE and dyspnea at rest, no other sx. Will continue to monitor.

## 2013-12-07 NOTE — Progress Notes (Signed)
Inpatient Diabetes Program Recommendations  AACE/ADA: New Consensus Statement on Inpatient Glycemic Control (2013)  Target Ranges:  Prepandial:   less than 140 mg/dL      Peak postprandial:   less than 180 mg/dL (1-2 hours)      Critically ill patients:  140 - 180 mg/dL     Results for MATEO, OVERBECK (MRN [id number]) as of 12/07/2013 17:07  Ref. Range 12/07/2013 07:58 12/07/2013 11:36 12/07/2013 15:59  Glucose-Capillary Latest Range: 70-99 mg/dL 086 (H) 578 (H) 469 (H)    **Patient on IV steroids.  Noted Levemir increased to 40 units QHS today (got dose at 10am today).   Inpatient Diabetes Program Recommendations Correction (SSI): Please increase SSI to Resistant scale (currently on Moderate scale). Insulin - Meal Coverage: Please increase meal coverage to Novolog 6 units tid with meals (currently getting Novolog 4 units tid with meals)   Will follow. Ambrose Finland RN, MSN, CDE Diabetes Coordinator Inpatient Diabetes Program Team Pager: [phone] (8a-10p)

## 2013-12-08 DIAGNOSIS — Z7901 Long term (current) use of anticoagulants: Secondary | ICD-10-CM

## 2013-12-08 LAB — GLUCOSE, CAPILLARY

## 2013-12-08 LAB — PROTIME-INR
INR: 2.27 — ABNORMAL HIGH (ref 0.00–1.49)
Prothrombin Time: 24.3 seconds — ABNORMAL HIGH (ref 11.6–15.2)

## 2013-12-08 MED ORDER — METFORMIN HCL 500 MG PO TABS
500.0000 mg | ORAL_TABLET | Freq: Two times a day (BID) | ORAL | Status: DC
  Administered 2013-12-08 – 2013-12-10 (×4): 500 mg via ORAL
  Filled 2013-12-08 (×6): qty 1

## 2013-12-08 MED ORDER — WARFARIN SODIUM 5 MG PO TABS
5.0000 mg | ORAL_TABLET | Freq: Once | ORAL | Status: AC
  Administered 2013-12-08: 5 mg via ORAL
  Filled 2013-12-08: qty 1

## 2013-12-08 MED ORDER — ALBUTEROL SULFATE HFA 108 (90 BASE) MCG/ACT IN AERS
2.0000 | INHALATION_SPRAY | Freq: Three times a day (TID) | RESPIRATORY_TRACT | Status: DC
  Administered 2013-12-08 – 2013-12-10 (×7): 2 via RESPIRATORY_TRACT
  Filled 2013-12-08: qty 6.7

## 2013-12-08 MED ORDER — POLYETHYLENE GLYCOL 3350 17 G PO PACK
17.0000 g | PACK | Freq: Every day | ORAL | Status: DC | PRN
  Administered 2013-12-08 – 2013-12-09 (×2): 17 g via ORAL
  Filled 2013-12-08 (×3): qty 1

## 2013-12-08 MED ORDER — INSULIN DETEMIR 100 UNIT/ML ~~LOC~~ SOLN
30.0000 [IU] | Freq: Two times a day (BID) | SUBCUTANEOUS | Status: DC
  Administered 2013-12-08 – 2013-12-10 (×4): 30 [IU] via SUBCUTANEOUS
  Filled 2013-12-08 (×5): qty 0.3

## 2013-12-08 MED ORDER — INSULIN DETEMIR 100 UNIT/ML ~~LOC~~ SOLN
25.0000 [IU] | Freq: Two times a day (BID) | SUBCUTANEOUS | Status: DC
  Filled 2013-12-08 (×2): qty 0.25

## 2013-12-08 NOTE — Progress Notes (Signed)
[facility] PROGRESS NOTE Assessment/Plan: Sepsis/HCAP (healthcare-associated pneumonia) - Cont Vanc and cefepime started on12.24.2014. - BC and sputum cultures negative. - influenza PCR negative, d/c tamiflu. - Increase in size of right upper lobe pulmonary mass. - Cont Iv steroids.  Atrial fibrillation with controlled ventricular response - Originally on diltiazem q12hrs short acting, change to q 6hr and HR controlled.  Essential hypertension - Now stable.  DM: - Change lantus to BID, restart metformin.  COPD GOLD III/IV - Continue O2 via nasal cannula. Scheduled and when necessary albuterol and Atrovent nebs.  - wheezing change steroids to IV.  Code Status: Full Code  Family Communication: Son at bedside  Disposition Plan: Home once improved    Consultants:  none  Procedures:  CXR  Antibiotics:  vanc cefepime  HPI/Subjective: SOB improved  Objective: Filed Vitals:   12/07/13 2226 12/08/13 0506 12/08/13 0628 12/08/13 0746  BP:  118/66    Pulse:  92    Temp:  98.2 F (36.8 C)    TempSrc:  Oral    Resp:  18    Height:      Weight:      SpO2: 94% 96% 97% 95%    Intake/Output Summary (Last 24 hours) at 12/08/13 0826 Last data filed at 12/08/13 0701  Gross per 24 hour  Intake   1340 ml  Output    895 ml  Net    445 ml   Filed Weights   12/05/13 2037 12/06/13 2143 12/07/13 2100  Weight: 79.578 kg (175 lb 7 oz) 80.74 kg (178 lb) 80.5 kg (177 lb 7.5 oz)    Exam:  General: Alert, awake, oriented x3, in no acute distress.  HEENT: No bruits, no goiter.  Heart: Regular rate and rhythm, without murmurs, rubs, gallops.  Lungs: moderate air movement , wheezing B/L Abdomen: Soft, nontender, nondistended, positive bowel sounds.  Neuro: Grossly intact, nonfocal.   Data Reviewed: Basic Metabolic Panel:  Recent Labs Lab 12/05/13 1348  NA 137  K 3.9  CL 99  CO2 26  GLUCOSE 150*  BUN 14  CREATININE 1.01  CALCIUM 9.1   Liver Function  Tests: No results found for this basename: AST, ALT, ALKPHOS, BILITOT, PROT, ALBUMIN,  in the last 168 hours No results found for this basename: LIPASE, AMYLASE,  in the last 168 hours No results found for this basename: AMMONIA,  in the last 168 hours CBC:  Recent Labs Lab 12/05/13 1348 12/06/13 0600  WBC 16.2* 10.4  HGB 13.9 12.0*  HCT 40.9 36.2*  MCV 91.5 92.1  PLT 188 165   Cardiac Enzymes: No results found for this basename: CKTOTAL, CKMB, CKMBINDEX, TROPONINI,  in the last 168 hours BNP (last 3 results) No results found for this basename: PROBNP,  in the last 8760 hours CBG:  Recent Labs Lab 12/07/13 0758 12/07/13 1136 12/07/13 1559 12/07/13 2052 12/08/13 0733  GLUCAP 274* 284* 339* 370* 287*    Recent Results (from the past 240 hour(s))  CULTURE, EXPECTORATED SPUTUM-ASSESSMENT     Status: None   Collection Time    12/05/13  3:46 PM      Result Value Range Status   Specimen Description SPUTUM   Final   Special Requests Immunocompromised   Final   Sputum evaluation     Final   Value: MICROSCOPIC FINDINGS SUGGEST THAT THIS SPECIMEN IS NOT REPRESENTATIVE OF LOWER RESPIRATORY SECRETIONS. PLEASE RECOLLECT.   Report Status 12/06/2013 FINAL   Final     Studies: No results  found.  Scheduled Meds: . albuterol  2 puff Inhalation TID  . atorvastatin  10 mg Oral q1800  . budesonide-formoterol  2 puff Inhalation BID  . ceFEPime (MAXIPIME) IV  2 g Intravenous Q8H  . diltiazem  60 mg Oral Q6H  . doxazosin  2 mg Oral Daily  . folic acid  1 mg Oral Daily  . guaiFENesin  600 mg Oral BID  . insulin aspart  0-15 Units Subcutaneous TID WC  . insulin aspart  0-5 Units Subcutaneous QHS  . insulin aspart  4 Units Subcutaneous TID WC  . insulin detemir  40 Units Subcutaneous QHS  . methylPREDNISolone (SOLU-MEDROL) injection  125 mg Intravenous Q8H  . vancomycin  1,000 mg Intravenous Q12H  . Warfarin - Pharmacist Dosing Inpatient   Does not apply q1800   Continuous  Infusions:    Marinda Elk  [facility] Pager [phone]. If 8PM-8AM, please contact night-coverage at [email or web address], password [facility] 12/08/2013, 8:26 AM  LOS: 3 days

## 2013-12-08 NOTE — Progress Notes (Signed)
ANTICOAGULATION and ANTIBIOTIC CONSULT NOTE - Follow Up Consult  Pharmacy Consult for coumadin; vancomycin and cefepime Indication: hx Afib; PNA  No Known Allergies  Patient Measurements: Height: 5\' 7"  (170.2 cm) Weight: 177 lb 7.5 oz (80.5 kg) IBW/kg (Calculated) : 66.1   Vital Signs: Temp: 98.2 F (36.8 C) (12/27 0506) Temp src: Oral (12/27 0506) BP: 118/66 mmHg (12/27 0506) Pulse Rate: 92 (12/27 0506)  Labs:  Recent Labs  12/05/13 1348  12/06/13 0600 12/07/13 0519 12/08/13 0615  HGB 13.9  --  12.0*  --   --   HCT 40.9  --  36.2*  --   --   PLT 188  --  165  --   --   LABPROT  --   < > 23.5* 21.8* 24.3*  INR  --   < > 2.17* 1.97* 2.27*  CREATININE 1.01  --   --   --   --   < > = values in this interval not displayed.  Estimated Creatinine Clearance: 65.3 ml/min (by C-G formula based on Cr of 1.01).  Assessment: Patient is a 72 y.o M on coumadin for hx Afib.  INR is therapeutic at 2.27.  No bleeding documented.  He's also on antibiotics day #4 for suspected PNA.  All cultures have been negative thus far.  Patient remains afebrile this morning and wbc wnl.  Goal of Therapy:  INR 2-3; vancomycin trough level= 15-20    Plan:  1) coumadin 5mg  PO x1 today 2) continue vancomycin 1gm IVq12h 3) continue cefepime to 2gm IV q8h 4) please indicate plan for abx-- pharmacy will plan on checking vancomycin level in a couple of days if to continue with antibiotic  Tonae Livolsi P 12/08/2013,10:25 AM

## 2013-12-09 DIAGNOSIS — J449 Chronic obstructive pulmonary disease, unspecified: Secondary | ICD-10-CM

## 2013-12-09 DIAGNOSIS — A419 Sepsis, unspecified organism: Secondary | ICD-10-CM

## 2013-12-09 DIAGNOSIS — E119 Type 2 diabetes mellitus without complications: Secondary | ICD-10-CM

## 2013-12-09 DIAGNOSIS — E785 Hyperlipidemia, unspecified: Secondary | ICD-10-CM

## 2013-12-09 DIAGNOSIS — R0609 Other forms of dyspnea: Secondary | ICD-10-CM

## 2013-12-09 LAB — PROTIME-INR
INR: 2.86 — ABNORMAL HIGH (ref 0.00–1.49)
Prothrombin Time: 29 seconds — ABNORMAL HIGH (ref 11.6–15.2)

## 2013-12-09 LAB — GLUCOSE, CAPILLARY
Glucose-Capillary: 305 mg/dL — ABNORMAL HIGH (ref 70–99)
Glucose-Capillary: 325 mg/dL — ABNORMAL HIGH (ref 70–99)
Glucose-Capillary: 273 mg/dL — ABNORMAL HIGH (ref 70–99)

## 2013-12-09 MED ORDER — WARFARIN SODIUM 2.5 MG PO TABS
2.5000 mg | ORAL_TABLET | Freq: Once | ORAL | Status: AC
  Administered 2013-12-09: 2.5 mg via ORAL
  Filled 2013-12-09: qty 1

## 2013-12-09 MED ORDER — METHYLPREDNISOLONE SODIUM SUCC 125 MG IJ SOLR
60.0000 mg | Freq: Three times a day (TID) | INTRAMUSCULAR | Status: DC
  Administered 2013-12-09 – 2013-12-10 (×3): 60 mg via INTRAVENOUS
  Filled 2013-12-09 (×6): qty 0.96

## 2013-12-09 NOTE — Progress Notes (Signed)
[facility]                                                                                Patient Demographics  Brent Wade, is a 72 y.o. male, DOB - [birth year], ZOX:[id number]  Admit date - 12/05/2013   Admitting Physician No admitting provider for patient encounter.  Outpatient Primary MD for the patient is Thora Lance, MD  LOS - 4   Chief Complaint  Patient presents with  . Shortness of Breath        Assessment & Plan    Principal Problem:   HCAP (healthcare-associated pneumonia) Active Problems:   DM   HYPERLIPIDEMIA   COPD GOLD III/IV   Adenocarcinoma, lung   Atrial fibrillation with controlled ventricular response   Essential hypertension   Secondary cardiomyopathy, unspecified   Sepsis(995.91)  Sepsis secondary HCAP (healthcare-associated pneumonia)  - Continue Vancomycin and cefepime started on12.24.2014.  - Blood and sputum cultures negative.  - influenza PCR negative, d/c tamiflu.  - Increase in size of right upper lobe pulmonary mass.  - Continue IV steroids, however, will taper.  Atrial fibrillation with controlled ventricular response  -Continue diltiazem 60 mg every 6 hours -Currently rate controlled -Continue warfarin per pharmacy, INR therapeutic.  Essential hypertension  -Stable.  Diabetes mellitus, type 2 -Change l continue metformin, Levemir or comment on sliding scale, CBG monitoring.  COPD GOLD III/IV  -Continue O2 via nasal cannula. Scheduled and when necessary albuterol and Atrovent nebs.  -Continue IV steroids.   Code Status: Full  Family Communication: None at bedside  Disposition Plan: Admitted, will discharge when stable   Procedures None  Consults  None  DVT Prophylaxis  Warfarin  Lab Results  Component Value Date   PLT 165 12/06/2013    Medications  Scheduled Meds: . albuterol  2 puff Inhalation TID  . atorvastatin  10 mg Oral q1800  . budesonide-formoterol  2 puff Inhalation BID  . ceFEPime  (MAXIPIME) IV  2 g Intravenous Q8H  . diltiazem  60 mg Oral Q6H  . doxazosin  2 mg Oral Daily  . folic acid  1 mg Oral Daily  . guaiFENesin  600 mg Oral BID  . insulin aspart  0-15 Units Subcutaneous TID WC  . insulin aspart  0-5 Units Subcutaneous QHS  . insulin aspart  4 Units Subcutaneous TID WC  . insulin detemir  30 Units Subcutaneous BID  . metFORMIN  500 mg Oral BID WC  . methylPREDNISolone (SOLU-MEDROL) injection  125 mg Intravenous Q8H  . vancomycin  1,000 mg Intravenous Q12H  . Warfarin - Pharmacist Dosing Inpatient   Does not apply q1800   Continuous Infusions:  PRN Meds:.albuterol, dextrose, ipratropium, metoprolol, ondansetron, polyethylene glycol, prochlorperazine, zolpidem  Antibiotics    Anti-infectives   Start     Dose/Rate Route Frequency Ordered Stop   12/06/13 0600  vancomycin (VANCOCIN) IVPB 1000 mg/200 mL premix     1,000 mg 200 mL/hr over 60 Minutes Intravenous Every 12 hours 12/05/13 1749     12/05/13 2200  oseltamivir (TAMIFLU) capsule 75 mg  Status:  Discontinued     75 mg Oral 2 times daily 12/05/13 1958 12/06/13 0930  12/05/13 2200  ceFEPIme (MAXIPIME) 2 g in dextrose 5 % 50 mL IVPB     2 g 100 mL/hr over 30 Minutes Intravenous 3 times per day 12/05/13 1749     12/05/13 1800  vancomycin (VANCOCIN) 1,500 mg in sodium chloride 0.9 % 500 mL IVPB     1,500 mg 250 mL/hr over 120 Minutes Intravenous  Once 12/05/13 1749 12/05/13 2134   12/05/13 1515  cefTRIAXone (ROCEPHIN) 1 g in dextrose 5 % 50 mL IVPB     1 g 100 mL/hr over 30 Minutes Intravenous  Once 12/05/13 1502 12/05/13 1557   12/05/13 1515  azithromycin (ZITHROMAX) 500 mg in dextrose 5 % 250 mL IVPB     500 mg 250 mL/hr over 60 Minutes Intravenous  Once 12/05/13 1502 12/05/13 1657       Time Spent in minutes   30 minutes   Leiya Keesey D.O. on 12/09/2013 at 10:40 AM  Between 7am to 7pm - Pager - [phone]  After 7pm go to [email or web address] - password [email or web address]  And look for the night  coverage person covering for me after hours  [facility] Office  [phone]    Subjective:   Brent Wade seen and examined today.  Patient states he is breathing better, he does continue to have shortness of breath with walking. Denies any chest pain, abdominal pain, dizziness    Objective:   Filed Vitals:   12/08/13 2004 12/08/13 2124 12/09/13 0536 12/09/13 0954  BP: 128/76  144/88 118/83  Pulse: 78  102 97  Temp: 97.5 F (36.4 C)  97.9 F (36.6 C) 97.6 F (36.4 C)  TempSrc: Oral  Oral Oral  Resp: 19  18 17   Height:      Weight: 82.555 kg (182 lb)     SpO2: 99% 95% 95% 99%    Wt Readings from Last 3 Encounters:  12/08/13 82.555 kg (182 lb)  11/28/13 82.283 kg (181 lb 6.4 oz)  11/26/13 81.693 kg (180 lb 1.6 oz)     Intake/Output Summary (Last 24 hours) at 12/09/13 1040 Last data filed at 12/09/13 0521  Gross per 24 hour  Intake    800 ml  Output    700 ml  Net    100 ml    Exam  General: Well developed, well nourished, NAD, appears stated age  HEENT: NCAT, PERRLA, EOMI, Anicteic Sclera, mucous membranes moist. No pharyngeal erythema or exudates  Neck: Supple, no JVD, no masses  Cardiovascular: S1 S2 auscultated, no rubs, murmurs or gallops. Regular rate and rhythm.  Respiratory: Diminished breath sounds noted in all lung fields, and expiratory wheezing noted  Abdomen: Soft, nontender, nondistended, + bowel sounds  Extremities: warm dry without cyanosis clubbing or edema  Neuro: AAOx3, cranial nerves grossly intact. Strength 5/5 in patient's upper and lower extremities bilaterally  Skin: Without rashes exudates or nodules  Psych: Normal affect and demeanor with intact judgement and insight  Data Review   Micro Results Recent Results (from the past 240 hour(s))  CULTURE, EXPECTORATED SPUTUM-ASSESSMENT     Status: None   Collection Time    12/05/13  3:46 PM      Result Value Range Status   Specimen Description SPUTUM   Final    Special Requests Immunocompromised   Final   Sputum evaluation     Final   Value: MICROSCOPIC FINDINGS SUGGEST THAT THIS SPECIMEN IS NOT REPRESENTATIVE OF LOWER RESPIRATORY SECRETIONS. PLEASE RECOLLECT.   Report Status 12/06/2013 FINAL  Final    Radiology Reports Dg Chest 2 View  12/05/2013   CLINICAL DATA:  Labored breathing  EXAM: CHEST  2 VIEW  COMPARISON:  None.  FINDINGS: Normal heart size. No pleural effusion or edema. Right upper lobe pulmonary lesions are again noted. The more central lesion measures 4.3 cm on today's exam. Previously 3.8 cm. Right lower lobe paramediastinal consolidation is identified and appears new from previous exam. Findings may reflect changes secondary to external beam radiation. The heart size and mediastinal contours are within normal limits. Both lungs are clear. The visualized skeletal structures are unremarkable.  IMPRESSION: 1. New right lower lobe paramediastinal consolidation which may reflect changes secondary to external beam radiation 2. Increase in size of right upper lobe pulmonary mass.   Electronically Signed   By: Signa Kell M.D.   On: 12/05/2013 13:02   Nm Pet Image Initial (pi) Skull Base To Thigh  11/23/2013   CLINICAL DATA:  Initial treatment strategy for lung cancer (adenocarcinoma on biopsy performed 10/31/2013).  EXAM: NUCLEAR MEDICINE PET SKULL BASE TO THIGH  FASTING BLOOD GLUCOSE:  Value: 177mg /dl  TECHNIQUE: 16.1 mCi W-96 FDG was injected intravenously. CT data was obtained and used for attenuation correction and anatomic localization only. (This was not acquired as a diagnostic CT examination.) Additional exam technical data entered on technologist worksheet.  COMPARISON:  Chest CT 10/23/2013 and 08/16/2013.  FINDINGS: NECK  There are no hypermetabolic cervical lymph nodes. No lesions of the pharyngeal mucosal space are identified.  CHEST  The dominant right upper lobe lesion demonstrates spiculation, central cavitation and measures  approximately 3.8 x 2.8 cm on image 74. This lesion is hypermetabolic with an SUV max of 5.2. The smaller satellite lesion more posteriorly in the right upper lobe measures 1.6 x 1.4 cm on image 73 and has an SUV max of 2.4. There is a 6 mm left upper lobe nodule on image number 90 which does not show any increased metabolic activity, although is not optimally evaluated based on size. No abnormal activity is seen associated with the small ground-glass density in the left lower lobe. There is no hypermetabolic nodal activity.  ABDOMEN/PELVIS  There is no abnormal metabolic activity within the liver, adrenal glands or pancreas. The gallbladder and left kidney are surgically absent. The right kidney demonstrates several cysts but no suspicious findings. The pancreatic tail is displaced into the left nephrectomy bed. There is focal enlargement of the pancreatic tail adjacent to the splenic vein, measuring 2.6 x 1.8 cm on image 138. This does not show any increased metabolic activity and is unchanged from the recent CT scans. Prominent activity seen throughout the colon. There appears to be mild associated diffuse colonic wall thickening. Aortoiliac atherosclerosis, bladder trabeculation and a TUR defect in the prostate gland are noted.  SKELETON  No focal hypermetabolic activity to suggest skeletal metastasis.  IMPRESSION: 1. 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 2. No hypermetabolic nodal activity. 3. Small left upper lobe nodule does not show increased metabolic activity, although is too small to optimally characterize. That nodule and the left lower lobe ground-glass opacity should be evaluated with follow-up CT to address stability. 4. Nodular enlargement of the pancreatic tail, displaced into the left nephrectomy bed. There is no associated hypermetabolic activity, and this may reflect a benign finding. 5. Diffuse  colonic activity associated with mild diffuse wall thickening on CT, suspicious for colitis. Correlate clinically.  Electronically Signed   By: Roxy Horseman M.D.   On: 11/23/2013 10:22    CBC  Recent Labs Lab 12/05/13 1348 12/06/13 0600  WBC 16.2* 10.4  HGB 13.9 12.0*  HCT 40.9 36.2*  PLT 188 165  MCV 91.5 92.1  MCH 31.1 30.5  MCHC 34.0 33.1  RDW 13.7 13.7    Chemistries   Recent Labs Lab 12/05/13 1348  NA 137  K 3.9  CL 99  CO2 26  GLUCOSE 150*  BUN 14  CREATININE 1.01  CALCIUM 9.1   ------------------------------------------------------------------------------------------------------------------ estimated creatinine clearance is 66 ml/min (by C-G formula based on Cr of 1.01). ------------------------------------------------------------------------------------------------------------------ No results found for this basename: HGBA1C,  in the last 72 hours ------------------------------------------------------------------------------------------------------------------ No results found for this basename: CHOL, HDL, LDLCALC, TRIG, CHOLHDL, LDLDIRECT,  in the last 72 hours ------------------------------------------------------------------------------------------------------------------ No results found for this basename: TSH, T4TOTAL, FREET3, T3FREE, THYROIDAB,  in the last 72 hours ------------------------------------------------------------------------------------------------------------------ No results found for this basename: VITAMINB12, FOLATE, FERRITIN, TIBC, IRON, RETICCTPCT,  in the last 72 hours  Coagulation profile  Recent Labs Lab 12/04/13 1131 12/05/13 1423 12/06/13 0600 12/07/13 0519 12/08/13 0615  INR 2.3 2.45* 2.17* 1.97* 2.27*    No results found for this basename: DDIMER,  in the last 72 hours  Cardiac Enzymes No results found for this basename: CK, CKMB, TROPONINI, MYOGLOBIN,  in the last 168  hours ------------------------------------------------------------------------------------------------------------------ No components found with this basename: POCBNP,

## 2013-12-09 NOTE — Progress Notes (Signed)
ANTICOAGULATION CONSULT NOTE - Follow Up Consult  Pharmacy Consult for coumadin Indication: atrial fibrillation  No Known Allergies  Patient Measurements: Height: 5\' 7"  (170.2 cm) Weight: 182 lb (82.555 kg) IBW/kg (Calculated) : 66.1   Vital Signs: Temp: 97.6 F (36.4 C) (12/28 0954) Temp src: Oral (12/28 0954) BP: 118/83 mmHg (12/28 0954) Pulse Rate: 97 (12/28 0954)  Labs:  Recent Labs  12/07/13 0519 12/08/13 0615 12/09/13 1041  LABPROT 21.8* 24.3* 29.0*  INR 1.97* 2.27* 2.86*    Estimated Creatinine Clearance: 66 ml/min (by C-G formula based on Cr of 1.01).  Assessment: Patient is a 72 y.o M on coumadin for Afib.  INR remains therapeutic but increased noticeably from 2.27 to 2.86 today.  No bleeding documented.  Goal of Therapy:  INR 2-3    Plan:   1) decrease coumadin dose to 2.5mg  PO x1 today   Brent Wade P 12/09/2013,12:06 PM

## 2013-12-10 LAB — PROTIME-INR: Prothrombin Time: 30.1 seconds — ABNORMAL HIGH (ref 11.6–15.2)

## 2013-12-10 LAB — GLUCOSE, CAPILLARY: Glucose-Capillary: 272 mg/dL — ABNORMAL HIGH (ref 70–99)

## 2013-12-10 MED ORDER — PREDNISONE (PAK) 10 MG PO TABS: ORAL_TABLET | Freq: Every day | ORAL | Status: DC

## 2013-12-10 MED ORDER — WARFARIN SODIUM 2.5 MG PO TABS: 2.5000 mg | ORAL_TABLET | Freq: Every day | ORAL | Status: DC

## 2013-12-10 MED ORDER — LEVOFLOXACIN 750 MG PO TABS: 750.0000 mg | ORAL_TABLET | Freq: Every day | ORAL | Status: DC

## 2013-12-10 NOTE — Progress Notes (Signed)
ANTICOAGULATION CONSULT NOTE - Follow Up Consult  Pharmacy Consult for coumadin Indication: atrial fibrillation  No Known Allergies  Patient Measurements: Height: 5\' 7"  (170.2 cm) Weight: 182 lb (82.555 kg) IBW/kg (Calculated) : 66.1   Vital Signs: Temp: 98.5 F (36.9 C) (12/29 1208) Temp src: Oral (12/29 1208) BP: 160/93 mmHg (12/29 1208) Pulse Rate: 100 (12/29 1208)  Labs:  Recent Labs  12/08/13 0615 12/09/13 1041 12/10/13 0633  LABPROT 24.3* 29.0* 30.1*  INR 2.27* 2.86* 3.00*    Estimated Creatinine Clearance: 66 ml/min (by C-G formula based on Cr of 1.01).  Assessment: Patient is a 72 y.o M on coumadin for Afib.  INR 3.0 today, up from 2.86 yesterday.  Increase in INR today is not as siginificant as noted yesterday (2.27 to 2.86). INR remains therapeutic. No bleeding documented. Dr. Catha Gosselin reports plan to discharge patient today. We discussed his coumadin dosage and antibiotic choice of levaquin.  I recommended coumadin dose reduced from prior to admission dose due to INR =3.0 today, and possibility of levaquin increasing coumadin effect.  Recommended coumadin 2.5 mg po daily with INR check on Wednesday 12/12/13, then as needed per primary MD. He will also need INR checked a few days after the levaquin has completed as coumadin may need to be adjusted.   Goal of Therapy:  INR 2-3    Plan:   Coumadin dose to 2.5mg  PO x1 today if not discharged today. If discharged today, coumadin dose & INR monitoring as noted above (see assessment).   Brent Wade, RPh Clinical Pharmacist Pager: [phone] 12/10/2013,1:04 PM

## 2013-12-10 NOTE — Progress Notes (Signed)
   CARE MANAGEMENT NOTE 12/10/2013  Patient:  CRAYTON, SAVARESE   Account Number:  [id number]  Date Initiated:  12/10/2013  Documentation initiated by:  Darlyne Russian  Subjective/Objective Assessment:   admitted with dyspnea, COPD, stage IV lung ca.     Action/Plan:   progression of care and discharge planning  Home Health RN   Anticipated DC Date:  12/10/2013   Anticipated DC Plan:  HOME W HOME HEALTH SERVICES      DC Planning Services  CM consult      Choice offered to / List presented to:  C-1 Patient        HH arranged  HH-1 RN      [facility] agency  [facility]   Status of service:  Completed, signed off Medicare Important Message given?   (If response is "NO", the following Medicare IM given date fields will be blank) Date Medicare IM given:   Date Additional Medicare IM given:    Discharge Disposition:  HOME W HOME HEALTH SERVICES  Per UR Regulation:    If discussed at Long Length of Stay Meetings, dates discussed:    Comments:  12/10/2013  [address] RN, [place]  [phone] CM referral: home health RN  Met with patient regarding discharge planning. He selected [facility] for home health RN [facility]/Marie called with referral

## 2013-12-10 NOTE — Discharge Summary (Signed)
Physician Discharge Summary  [name] DOB: 08/09/1941 DOA: 12/05/2013  PCP: Thora Lance, MD  Admit date: 12/05/2013 Discharge date: 12/10/2013  Time spent: 40 minutes  Recommendations for Outpatient Follow-up:  Patient will be discharged home with home health, nursing. He should continue taking his antibiotics as well as other medications as prescribed. Patient should also follow up with his primary care physician in 2 days on 12/12/2013 for his INR check.  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 He also reported subjective fevers and chills. He reports productive cough with yellowish phlegm and a scanty blood-tinged sputum. He denies any nasal congestion or rhinorrhea, reports a mild headache, denies blurred vision, earache, sore throat, palpitations, orthopnea, PND or leg swelling. he denies any nausea or vomiting, abdominal pain, bowel or urinary symptoms. He reports generalized malaise. Patient reports having cough since his bronchoscopy about 3 weeks back. He denied any sick contacts or recent travel. He reports getting a flu vaccine this season.   Hospital Course:  This is a  72 year old male with a history of COPD, adenocarcinoma the right lung currently stage IV, diabetes mellitus type 2, H. fibrillation on Coumadin therapy that presents emergency department with increasing shortness of breath and productive cough. Patient also had some fevers at home. Patient was admitted for sepsis secondary to healthcare associated pneumonia, was started on vancomycin as well as cefepime. He is also known to have COPD secondary to healthcare acquired pneumonia.  Patient was also started on IV steroids however our taper. He was noted to have a negative influenza PCR. He was empirically started on 10 however this was discontinued after the negative result.  Patient was noted to have a negative strep pneumo and Legionella urine antigen. Patient did improve with nebulizer treatments.  Patient also states he has nebulizer treatments as well as oxygen at home. Patient also has history of atrial fibrillation he was noted to have ventricular response on the hospital was placed on diltiazem every 6 hours. He did remain rate controlled. We did continue his warfarin, his INR did remain therapeutic. Patient was also noted to have hypertension which remained stable his metformin was continued along with insulin sliding scale and CBG monitoring. Patient does have a history of COPD likely stage III or stage IV. He was continued on oxygen to maintain his oxygen saturations above 92%. Patient also did continue his albuterol and Atrovent nebulizers as well as steroids. Patient will be discharged home with home health nursing. He is eating his antibiotics as prescribed as well as his other medications. Patient should have his INR checked on 12/12/2013 with his primary care physician. This was discussed with the patient he does understand agree.  Procedures: None  Consultations: None  Discharge Exam: Filed Vitals:  12/10/13 0802  BP: 142/89  Pulse: 83  Temp: 97.5 F (36.4 C)  Resp: 18   Exam   General: Well developed, well nourished, NAD, appears stated age  HEENT: NCAT, PERRLA, EOMI, Anicteic Sclera, mucous membranes moist. No pharyngeal erythema or exudates  Neck: Supple, no JVD, no masses  Cardiovascular: S1 S2 auscultated, no rubs, murmurs or gallops. Regular rate and rhythm.  Respiratory: Diminished breath sounds noted in all lung fields, and scattered expiratory wheezing noted however improved. Abdomen: Soft, nontender, nondistended, + bowel sounds  Extremities: warm dry without cyanosis clubbing or edema  Neuro: AAOx3, cranial nerves grossly intact. Strength 5/5 in patient's upper and lower extremities bilaterally  Skin: Without rashes exudates or nodules  Psych: Normal affect and demeanor with intact judgement and insight  Discharge Instructions  Discharge Orders   Future Appointments Provider Department Dept Phone   12/18/2013 1:45 PM Lurline Hare, MD [facility]COL[facility] [phone]   Joint Appt [facility] [place] [facility] RAD[facility]COL[facility] [phone]   12/20/2013 2:00 PM Lurline Hare, MD [facility] RAD[facility]COL[facility] [phone]   Joint Appt [facility] [place] [facility] RADI[facility] ONCOLOG[facility] [phone]   12/24/2013 2:00 PM Lurline Hare, MD [facility] RAD[facility] ONCOLOGY [phone]   Joint Appt [facility] [place] [facility] RADI[facility] ONCOLOGY [phone]   12/26/2013 2:00 PM Lurline Hare, MD [facility] RADI[facility] ONCOLOGY [phone]   Joint Appt [facility] [place] [facility] RAD[facility] ONCOLOG[facility] [phone]   12/28/2013 2:00 PM Lurline Hare, MD [facility] RAD[facility] ONCOLOG[facility] [phone]   Joint Appt [facility] [place] [facility] RAD[facility] ONCOLOG[facility] [phone]   12/31/2013 2:00 PM Lurline Hare, MD [facility] RADI[facility] ONCOLOGY [phone]   Joint Appt [facility]  [place] [facility] RADI[facility] ONCOL[facility] [phone]   01/01/2014 11:30 AM Phillips Hay, RPH-CPP [facility] [phone]   01/02/2014 2:00 PM Lurline Hare, MD [facility] RAD[facility] ONCOLOGY [phone]   Joint Appt [facility] [place] [facility] RADIATION ONCOLOGY [phone]   01/04/2014 2:00 PM Lurline Hare, MD [facility] RADIATION ONCOLOGY [phone]   Joint Appt [facility] [place] [facility] RADI[facility] ONCOLOGY [phone]   01/07/2014 2:00 PM Lurline Hare, MD [facility] RADI[facility] ONCOLOGY [phone]   Joint Appt [facility] [place] [facility] RADI[facility] ONCOLOGY [phone]   01/09/2014 2:00 PM Lurline Hare, MD [place] [facility] RADIATION ONCOLOGY [phone]   Joint Appt [facility] [place] [facility] RADI[facility] ONCOLOGY [phone]   Future Orders Complete By Expires   Diet - low sodium heart healthy  As directed    Discharge instructions  As directed    Comments:     Patient will be discharged home with home health, nursing. He should continue taking his antibiotics as well as other medications as prescribed. Patient should also follow up with his primary care physician in 2 days on 12/12/2013 for his INR check.   Home Health  As directed    Questions:     To provide the following care/treatments:  RN   Increase activity slowly  As directed        Medication List         albuterol (2.5 MG/3ML) 0.083% nebulizer solution  Commonly known as:  PROVENTIL  Take 3 mLs (2.5 mg total) by nebulization every 6 (six) hours as needed  for wheezing or shortness of breath.     albuterol 108 (90 BASE) MCG/ACT inhaler  Commonly known as:  PROVENTIL HFA;VENTOLIN HFA  Inhale 2 puffs into the lungs every 6 (six) hours as needed for wheezing or shortness of breath.     budesonide-formoterol 160-4.5 MCG/ACT inhaler  Commonly known as:  SYMBICORT   Inhale 2 puffs into the lungs 2 (two) times daily.     diltiazem 60 MG tablet  Commonly known as:  CARDIZEM  Take 1 tablet (60 mg total) by mouth 2 (two) times daily.     doxazosin 2 MG tablet  Commonly known as:  CARDURA  Take 2 mg by mouth daily.     folic acid 1 MG tablet  Commonly known as:  FOLVITE  Take 1 tablet (1 mg total) by mouth daily. Take 1 day     levofloxacin 750 MG tablet  Commonly known as:  LEVAQUIN  Take 1 tablet (750 mg total) by mouth daily.     metFORMIN 500 MG tablet  Commonly known as:  GLUCOPHAGE  Take 500 mg by mouth 2 (two) times daily with a meal.     ondansetron 8 MG tablet  Commonly known as:  ZOFRAN  Take 1 tablet (8 mg total) by mouth every 8 (eight) hours as needed.     predniSONE 10 MG tablet  Commonly known as:  STERAPRED UNI-PAK  Take by mouth daily. Prednisone dosing: Take  Prednisone 60mg  (6 tabs) x 3 days, then taper to 50mg  (5 tabs) x 3 days, then 40mg  (4 tabs) x 3days, then 30mg  (3 tabs) x 3days, then 20mg  (2 tabs) x 3 days, then 10mg  (1 tab) x3 days, then STOP.     prochlorperazine 10 MG tablet  Commonly known as:  COMPAZINE  Take 1 tablet (10 mg total) by mouth every 6 (six) hours as needed (Nausea or vomiting).     simvastatin 40 MG tablet  Commonly known as:  ZOCOR  Take 0.5 tablets (20 mg total) by mouth at bedtime.     TUDORZA PRESSAIR 400 MCG/ACT Aepb  Generic drug:  Aclidinium Bromide  Inhale 1 puff into the lungs 2 (two) times daily.     warfarin 2.5 MG tablet  Commonly known as:  COUMADIN  Take 1 tablet (2.5 mg total) by mouth daily.       No Known Allergies     Follow-up Information   Follow up with Thora Lance, MD. Schedule an appointment as soon as possible for a visit in 2 days. (INR check)    Specialty:  Family Medicine   Contact information:   [address], [address] [place] [address] [phone]        The results of significant diagnostics from this hospitalization (including  imaging, microbiology, ancillary and laboratory) are listed below for reference.    Significant Diagnostic Studies: Dg Chest 2 View  12/05/2013   CLINICAL DATA:  Labored breathing  EXAM: CHEST  2 VIEW  COMPARISON:  None.  FINDINGS: Normal heart size. No pleural effusion or edema. Right upper lobe pulmonary lesions are again noted. The more central lesion measures 4.3 cm on today's exam. Previously 3.8 cm. Right lower lobe paramediastinal consolidation is identified and appears new from previous exam. Findings may reflect changes secondary to external beam radiation. The heart size and mediastinal contours are within normal limits. Both lungs are clear. The visualized skeletal structures are unremarkable.  IMPRESSION: 1. New right lower lobe paramediastinal consolidation which may  reflect changes secondary to external beam radiation 2. Increase in size of right upper lobe pulmonary mass.   Electronically Signed   By: Signa Kell M.D.   On: 12/05/2013 13:02   Nm Pet Image Initial (pi) Skull Base To Thigh  11/23/2013   CLINICAL DATA:  Initial treatment strategy for lung cancer (adenocarcinoma on biopsy performed 10/31/2013).  EXAM: NUCLEAR MEDICINE PET SKULL BASE TO THIGH  FASTING BLOOD GLUCOSE:  Value: 177mg /dl  TECHNIQUE: 40.1 mCi U-27 FDG was injected intravenously. CT data was obtained and used for attenuation correction and anatomic localization only. (This was not acquired as a diagnostic CT examination.) Additional exam technical data entered on technologist worksheet.  COMPARISON:  Chest CT 10/23/2013 and 08/16/2013.  FINDINGS: NECK  There are no hypermetabolic cervical lymph nodes. No lesions of the pharyngeal mucosal space are identified.  CHEST  The dominant right upper lobe lesion demonstrates spiculation, central cavitation and measures approximately 3.8 x 2.8 cm on image 74. This lesion is hypermetabolic with an SUV max of 5.2. The smaller satellite lesion more posteriorly in the right upper  lobe measures 1.6 x 1.4 cm on image 73 and has an SUV max of 2.4. There is a 6 mm left upper lobe nodule on image number 90 which does not show any increased metabolic activity, although is not optimally evaluated based on size. No abnormal activity is seen associated with the small ground-glass density in the left lower lobe. There is no hypermetabolic nodal activity.  ABDOMEN/PELVIS  There is no abnormal metabolic activity within the liver, adrenal glands or pancreas. The gallbladder and left kidney are surgically absent. The right kidney demonstrates several cysts but no suspicious findings. The pancreatic tail is displaced into the left nephrectomy bed. There is focal enlargement of the pancreatic tail adjacent to the splenic vein, measuring 2.6 x 1.8 cm on image 138. This does not show any increased metabolic activity and is unchanged from the recent CT scans. Prominent activity seen throughout the colon. There appears to be mild associated diffuse colonic wall thickening. Aortoiliac atherosclerosis, bladder trabeculation and a TUR defect in the prostate gland are noted.  SKELETON  No focal hypermetabolic activity to suggest skeletal metastasis.  IMPRESSION: 1. 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 2. No hypermetabolic nodal activity. 3. Small left upper lobe nodule does not show increased metabolic activity, although is too small to optimally characterize. That nodule and the left lower lobe ground-glass opacity should be evaluated with follow-up CT to address stability. 4. Nodular enlargement of the pancreatic tail, displaced into the left nephrectomy bed. There is no associated hypermetabolic activity, and this may reflect a benign finding. 5. Diffuse colonic activity associated with mild diffuse wall thickening on CT, suspicious for colitis. Correlate clinically.   Electronically Signed   By: Roxy Horseman M.D.    On: 11/23/2013 10:22    Microbiology: Recent Results (from the past 240 hour(s))  CULTURE, EXPECTORATED SPUTUM-ASSESSMENT     Status: None   Collection Time    12/05/13  3:46 PM      Result Value Range Status   Specimen Description SPUTUM   Final   Special Requests Immunocompromised   Final   Sputum evaluation     Final   Value: MICROSCOPIC FINDINGS SUGGEST THAT THIS SPECIMEN IS NOT REPRESENTATIVE OF LOWER RESPIRATORY SECRETIONS. PLEASE RECOLLECT.   Report Status 12/06/2013 FINAL   Final     Labs: Basic  Metabolic Panel:  Recent Labs Lab 12/05/13 1348  NA 137  K 3.9  CL 99  CO2 26  GLUCOSE 150*  BUN 14  CREATININE 1.01  CALCIUM 9.1   Liver Function Tests: No results found for this basename: AST, ALT, ALKPHOS, BILITOT, PROT, ALBUMIN,  in the last 168 hours No results found for this basename: LIPASE, AMYLASE,  in the last 168 hours No results found for this basename: AMMONIA,  in the last 168 hours CBC:  Recent Labs Lab 12/05/13 1348 12/06/13 0600  WBC 16.2* 10.4  HGB 13.9 12.0*  HCT 40.9 36.2*  MCV 91.5 92.1  PLT 188 165   Cardiac Enzymes: No results found for this basename: CKTOTAL, CKMB, CKMBINDEX, TROPONINI,  in the last 168 hours BNP: BNP (last 3 results) No results found for this basename: PROBNP,  in the last 8760 hours CBG:  Recent Labs Lab 12/09/13 0745 12/09/13 1130 12/09/13 1619 12/09/13 2134 12/10/13 0754  GLUCAP 269* 305* 325* 273* 221*       Signed:  Otoniel Myhand  [facility] 12/10/2013, 10:44 AM

## 2013-12-12 LAB — POCT INR: INR: 1.7

## 2013-12-14 ENCOUNTER — Ambulatory Visit (INDEPENDENT_AMBULATORY_CARE_PROVIDER_SITE_OTHER): Payer: Medicare Other | Admitting: Pharmacist Clinician (PhC)/ Clinical Pharmacy Specialist

## 2013-12-14 DIAGNOSIS — I4891 Unspecified atrial fibrillation: Secondary | ICD-10-CM

## 2013-12-14 DIAGNOSIS — Z7901 Long term (current) use of anticoagulants: Secondary | ICD-10-CM

## 2013-12-18 ENCOUNTER — Encounter: Payer: Self-pay | Admitting: Radiation Oncology

## 2013-12-18 ENCOUNTER — Ambulatory Visit
Admission: RE | Admit: 2013-12-18 | Discharge: 2013-12-18 | Disposition: A | Discharge status: Home / Self Care | Payer: Medicare Other | Source: Ambulatory Visit | Attending: Radiation Oncology | Admitting: Radiation Oncology

## 2013-12-18 VITALS — Wt 171.1 lb

## 2013-12-18 DIAGNOSIS — C3491 Malignant neoplasm of unspecified part of right bronchus or lung: Secondary | ICD-10-CM

## 2013-12-18 NOTE — Progress Notes (Signed)
  Radiation Oncology         [phone] ________________________________  Name: Brent Wade MRN: [id number]  Date: 12/18/2013  DOB: January 09, 1941  Stereotactic Body Radiotherapy Treatment Procedure Note (fraction 1/3)  NARRATIVE:  Brent Wade was brought to the stereotactic radiation treatment machine and placed supine on the CT couch. The patient was set up for stereotactic body radiotherapy on the body fix pillow.  3D TREATMENT PLANNING AND DOSIMETRY:  The patient's radiation plan was reviewed and approved prior to starting treatment.  It showed 3-dimensional radiation distributions overlaid onto the planning CT.  The [facility] for the target structures as well as the organs at risk were reviewed. The documentation of this is filed in the radiation oncology EMR.  SIMULATION VERIFICATION:  The patient underwent CT imaging on the treatment unit.  These were carefully aligned to document that the ablative radiation dose would cover the target volume and maximally spare the nearby organs at risk according to the planned distribution.  SPECIAL TREATMENT PROCEDURE: Brent Wade received high dose ablative stereotactic body radiotherapy to the planned target volume without unforeseen complications. Treatment was delivered uneventfully. The high doses associated with stereotactic body radiotherapy and the significant potential risks require careful treatment set up and patient monitoring constituting a special treatment procedure   STEREOTACTIC TREATMENT MANAGEMENT:  Following delivery, the patient was evaluated clinically. The patient tolerated treatment without significant acute effects, and was discharged to home in stable condition.    PLAN: Continue treatment as planned.  _________________________   Thea Silversmith, MD

## 2013-12-19 ENCOUNTER — Ambulatory Visit: Payer: Medicare Other

## 2013-12-19 ENCOUNTER — Telehealth: Payer: Self-pay | Admitting: Cardiology

## 2013-12-19 ENCOUNTER — Ambulatory Visit (INDEPENDENT_AMBULATORY_CARE_PROVIDER_SITE_OTHER): Payer: Medicare Other | Admitting: Pharmacist Clinician (PhC)/ Clinical Pharmacy Specialist

## 2013-12-19 DIAGNOSIS — Z7901 Long term (current) use of anticoagulants: Secondary | ICD-10-CM

## 2013-12-19 DIAGNOSIS — I4891 Unspecified atrial fibrillation: Secondary | ICD-10-CM

## 2013-12-19 LAB — POCT INR: INR: 1.9

## 2013-12-19 NOTE — Telephone Encounter (Signed)
Please call.  Has PT INR results.

## 2013-12-19 NOTE — Telephone Encounter (Signed)
INR was 1.9 today,  RN will repeat INR in 1 week - see anticoag note

## 2013-12-20 ENCOUNTER — Ambulatory Visit
Admission: RE | Admit: 2013-12-20 | Discharge: 2013-12-20 | Disposition: A | Discharge status: Home / Self Care | Payer: Medicare Other | Source: Ambulatory Visit | Attending: Radiation Oncology | Admitting: Radiation Oncology

## 2013-12-20 DIAGNOSIS — C3491 Malignant neoplasm of unspecified part of right bronchus or lung: Secondary | ICD-10-CM

## 2013-12-21 ENCOUNTER — Ambulatory Visit: Payer: Medicare Other | Admitting: Hematology and Oncology

## 2013-12-21 ENCOUNTER — Ambulatory Visit: Payer: Medicare Other

## 2013-12-21 ENCOUNTER — Other Ambulatory Visit: Payer: Medicare Other

## 2013-12-21 NOTE — Progress Notes (Signed)
  Radiation Oncology         (336) [phone] ________________________________  Name: Brent Wade MRN: [id number]  Date: 12/20/2013  DOB: 04/28/1941  Stereotactic Body Radiotherapy Treatment Procedure Note (fraction #2/3)  NARRATIVE:  Brent Wade was brought to the stereotactic radiation treatment machine and placed supine on the CT couch. The patient was set up for stereotactic body radiotherapy on the body fix pillow.  3D TREATMENT PLANNING AND DOSIMETRY:  The patient's radiation plan was reviewed and approved prior to starting treatment.  It showed 3-dimensional radiation distributions overlaid onto the planning CT.  The [facility] for the target structures as well as the organs at risk were reviewed. The documentation of this is filed in the radiation oncology EMR.  SIMULATION VERIFICATION:  The patient underwent CT imaging on the treatment unit.  These were carefully aligned to document that the ablative radiation dose would cover the target volume and maximally spare the nearby organs at risk according to the planned distribution.  SPECIAL TREATMENT PROCEDURE: Brent Wade received high dose ablative stereotactic body radiotherapy to the planned target volume without unforeseen complications. Treatment was delivered uneventfully. The high doses associated with stereotactic body radiotherapy and the significant potential risks require careful treatment set up and patient monitoring constituting a special treatment procedure   STEREOTACTIC TREATMENT MANAGEMENT:  Following delivery, the patient was evaluated clinically. The patient tolerated treatment without significant acute effects, and was discharged to home in stable condition.    PLAN: Continue treatment as planned.  _________________________   Thea Silversmith, MD

## 2013-12-24 ENCOUNTER — Ambulatory Visit
Admission: RE | Admit: 2013-12-24 | Discharge: 2013-12-24 | Disposition: A | Discharge status: Home / Self Care | Payer: Medicare Other | Source: Ambulatory Visit | Attending: Radiation Oncology | Admitting: Radiation Oncology

## 2013-12-24 NOTE — Progress Notes (Signed)
  Radiation Oncology         (336) [phone] ________________________________  Name: TIANT PEIXOTO MRN: [id number]  Date: 12/24/2013  DOB: Apr 30, 1941  Stereotactic Body Radiotherapy Treatment Procedure Note  NARRATIVE:  CAMARON CAMMACK was brought to the stereotactic radiation treatment machine and placed supine on the CT couch. The patient was set up for stereotactic body radiotherapy on the body fix pillow.  3D TREATMENT PLANNING AND DOSIMETRY:  The patient's radiation plan was reviewed and approved prior to starting treatment.  It showed 3-dimensional radiation distributions overlaid onto the planning CT.  The [facility] for the target structures as well as the organs at risk were reviewed. The documentation of this is filed in the radiation oncology EMR.  SIMULATION VERIFICATION:  The patient underwent CT imaging on the treatment unit.  These were carefully aligned to document that the ablative radiation dose would cover the target volume and maximally spare the nearby organs at risk according to the planned distribution.  SPECIAL TREATMENT PROCEDURE: VIRGAL WARMUTH received high dose ablative stereotactic body radiotherapy to the planned target volume without unforeseen complications. Treatment was delivered uneventfully. The high doses associated with stereotactic body radiotherapy and the significant potential risks require careful treatment set up and patient monitoring constituting a special treatment procedure   STEREOTACTIC TREATMENT MANAGEMENT:  Following delivery, the patient was evaluated clinically. The patient tolerated treatment without significant acute effects, and was discharged to home in stable condition.    PLAN: Continue treatment as planned.  ________________________________  Blair Promise, PhD, MD

## 2013-12-25 ENCOUNTER — Ambulatory Visit: Payer: Medicare Other | Admitting: Radiation Oncology

## 2013-12-25 ENCOUNTER — Ambulatory Visit: Payer: Medicare Other

## 2013-12-25 ENCOUNTER — Ambulatory Visit
Admission: RE | Admit: 2013-12-25 | Discharge: 2013-12-25 | Disposition: A | Discharge status: Home / Self Care | Payer: Medicare Other | Source: Ambulatory Visit | Attending: Radiation Oncology | Admitting: Radiation Oncology

## 2013-12-25 ENCOUNTER — Encounter: Payer: Self-pay | Admitting: Radiation Oncology

## 2013-12-25 VITALS — BP 101/56 | HR 116 | Temp 97.8°F | Resp 22 | Wt 167.0 lb

## 2013-12-25 DIAGNOSIS — C3490 Malignant neoplasm of unspecified part of unspecified bronchus or lung: Secondary | ICD-10-CM

## 2013-12-25 MED ORDER — BIAFINE EX EMUL
CUTANEOUS | Status: DC | PRN
  Administered 2013-12-25: 15:00:00 via TOPICAL

## 2013-12-25 NOTE — Progress Notes (Signed)
Weekly Management Note Current Dose:  2.7 Gy  Projected Dose: 59.4 Gy   Narrative:  The patient presents for routine under treatment assessment.  CBCT/MVCT images/Port film x-rays were reviewed.  The chart was checked. Doing well. Breathing stable.  Physical Findings: Weight: 167 lb (75.751 kg). Unchanged  Impression:  The patient is tolerating radiation.  Plan:  Continue treatment as planned. Rn education performed.

## 2013-12-25 NOTE — Progress Notes (Signed)
I verified the position of the tumor volume on the machine today. I checked the position of the tumor and its relation to critical structures on the CBCT.  I approved the position for treatment planning.

## 2013-12-25 NOTE — Progress Notes (Signed)
Weekly rad tx IMRT RUL 4 completed , pt education done, biafine cream and rad book, given, discussed side effects, sypmtoms to report, 98% room air,  Hoarseness,  Sob with exertion, has oxygen tank in car, no c/o pain, appetite better, still fatigue from pneumonia 2:25 PM

## 2013-12-26 ENCOUNTER — Ambulatory Visit: Payer: Medicare Other | Admitting: Radiation Oncology

## 2013-12-26 ENCOUNTER — Ambulatory Visit: Payer: Medicare Other

## 2013-12-26 ENCOUNTER — Telehealth: Payer: Self-pay | Admitting: Internal Medicine

## 2013-12-26 ENCOUNTER — Telehealth: Payer: Self-pay | Admitting: Cardiology

## 2013-12-26 ENCOUNTER — Ambulatory Visit
Admission: RE | Admit: 2013-12-26 | Discharge: 2013-12-26 | Disposition: A | Discharge status: Home / Self Care | Payer: Medicare Other | Source: Ambulatory Visit | Attending: Radiation Oncology | Admitting: Radiation Oncology

## 2013-12-26 DIAGNOSIS — R0609 Other forms of dyspnea: Principal | ICD-10-CM

## 2013-12-26 DIAGNOSIS — J449 Chronic obstructive pulmonary disease, unspecified: Secondary | ICD-10-CM

## 2013-12-26 LAB — POCT INR: INR: 1.9

## 2013-12-26 NOTE — Telephone Encounter (Signed)
Please call  Has an INR to report.

## 2013-12-26 NOTE — Telephone Encounter (Signed)
He apparently was not certified for 02 reiumbursement prior to discharge so needs [facility] to assess his needs with resting and ambulatory 02 sat and also order ono RA to qualify

## 2013-12-26 NOTE — Telephone Encounter (Signed)
Spoke with Almyra Free. She reports pt is advising them he is SOB and is needing O2. Pt has been using his spouse's O2. Now she is on hospice and is requiring her O2 all the time. When Almyra Free visit's pt his sats are fine. I advised her we will have to call pt and get him in for an OV to be evaluated.  I called pt and had to leave message to call back as he was gone.

## 2013-12-26 NOTE — Telephone Encounter (Signed)
Pt returned call.  Spoke with patient who reported that he has been having increased SOB x3 week since discharge from hospital on 12.29.14.  Per pt's chart he was admitted for HCAP.  Pt stated that he was supposed to be discharged on O2 but was later told that MW would have to order this.  Pt has been using his wife's O2 but as she is now on hospice and requiring continuous oxygen, he would like MW to order this for him.  Advised pt that a follow up in the office is needed to evaluate him for this and to also follow up on his pneumonia.  Pt stated that he does not have the money for a copay.  I did advise pt that he is not required to pay his copay at time of ov, but pt stated that he is not coming in for ov and would like to know if MW will order O2 for him.  Pt was discharged on Levaquin and prednisone Pt saw RB on 12.10.14 for consideration of repeat bronch and possible EBUS Last ov w/ MW 10.16.14 No pending appts  Dr Melvyn Novas please advise, thank you.

## 2013-12-27 ENCOUNTER — Ambulatory Visit: Payer: Medicare Other

## 2013-12-27 ENCOUNTER — Ambulatory Visit
Admission: RE | Admit: 2013-12-27 | Discharge: 2013-12-27 | Disposition: A | Discharge status: Home / Self Care | Payer: Medicare Other | Source: Ambulatory Visit | Attending: Radiation Oncology | Admitting: Radiation Oncology

## 2013-12-27 ENCOUNTER — Telehealth: Payer: Self-pay | Admitting: Internal Medicine

## 2013-12-27 ENCOUNTER — Ambulatory Visit (INDEPENDENT_AMBULATORY_CARE_PROVIDER_SITE_OTHER): Payer: Medicare Other | Admitting: Pharmacist Clinician (PhC)/ Clinical Pharmacy Specialist

## 2013-12-27 DIAGNOSIS — I4891 Unspecified atrial fibrillation: Secondary | ICD-10-CM

## 2013-12-27 DIAGNOSIS — Z7901 Long term (current) use of anticoagulants: Secondary | ICD-10-CM

## 2013-12-27 NOTE — Telephone Encounter (Signed)
INR 1.9 see anticoag encounter

## 2013-12-27 NOTE — Telephone Encounter (Signed)
Pt is scheduled to come in and see MW on 01/01/14 for HFU. Will forward to Dr. Melvyn Novas so he is aware we will need to get qualifying O2 sats on pt for daytime O2.

## 2013-12-27 NOTE — Telephone Encounter (Signed)
Called spoke with Almyra Free w/ [facility] and advised of MW's recs as stated below.  Almyra Free okay with these recs and verbalized her understanding.  Called spoke with patient and discussed MW's recs with him as well.  Pt grateful and verbalized his understanding.  Pt did state that he would like to come in for HFU w/ MW.  Advised pt again that he can defer his copay and be billed for that amount.  HFU scheduled for 1.20.15 w/ MW @ 3.15pm.  Orders placed to [facility].  Nothing further needed at this time; will sign off.

## 2013-12-28 ENCOUNTER — Ambulatory Visit: Payer: Medicare Other

## 2013-12-28 ENCOUNTER — Ambulatory Visit
Admission: RE | Admit: 2013-12-28 | Discharge: 2013-12-28 | Disposition: A | Discharge status: Home / Self Care | Payer: Medicare Other | Source: Ambulatory Visit | Attending: Radiation Oncology | Admitting: Radiation Oncology

## 2013-12-28 ENCOUNTER — Ambulatory Visit: Payer: Medicare Other | Admitting: Radiation Oncology

## 2013-12-31 ENCOUNTER — Ambulatory Visit
Admission: RE | Admit: 2013-12-31 | Discharge: 2013-12-31 | Disposition: A | Discharge status: Home / Self Care | Payer: Medicare Other | Source: Ambulatory Visit | Attending: Radiation Oncology | Admitting: Radiation Oncology

## 2013-12-31 ENCOUNTER — Ambulatory Visit: Payer: Medicare Other | Admitting: Radiation Oncology

## 2013-12-31 ENCOUNTER — Ambulatory Visit: Payer: Medicare Other

## 2014-01-01 ENCOUNTER — Ambulatory Visit: Payer: Medicare Other

## 2014-01-01 ENCOUNTER — Ambulatory Visit
Admission: RE | Admit: 2014-01-01 | Discharge: 2014-01-01 | Disposition: A | Discharge status: Home / Self Care | Payer: Medicare Other | Source: Ambulatory Visit | Attending: Radiation Oncology | Admitting: Radiation Oncology

## 2014-01-01 ENCOUNTER — Ambulatory Visit (INDEPENDENT_AMBULATORY_CARE_PROVIDER_SITE_OTHER): Payer: Medicare Other | Admitting: Pharmacist Clinician (PhC)/ Clinical Pharmacy Specialist

## 2014-01-01 ENCOUNTER — Encounter: Payer: Self-pay | Admitting: Radiation Oncology

## 2014-01-01 ENCOUNTER — Ambulatory Visit (INDEPENDENT_AMBULATORY_CARE_PROVIDER_SITE_OTHER)
Admission: RE | Admit: 2014-01-01 | Discharge: 2014-01-01 | Disposition: A | Discharge status: Home / Self Care | Payer: Medicare Other | Source: Ambulatory Visit | Attending: Internal Medicine | Admitting: Internal Medicine

## 2014-01-01 ENCOUNTER — Encounter: Payer: Self-pay | Admitting: Internal Medicine

## 2014-01-01 ENCOUNTER — Ambulatory Visit (INDEPENDENT_AMBULATORY_CARE_PROVIDER_SITE_OTHER): Payer: Medicare Other | Admitting: Internal Medicine

## 2014-01-01 VITALS — BP 137/58 | HR 90 | Temp 97.5°F | Ht 67.0 in | Wt 171.7 lb

## 2014-01-01 VITALS — BP 128/74 | HR 92 | Temp 97.7°F | Ht 68.0 in | Wt 172.0 lb

## 2014-01-01 VITALS — BP 148/84 | HR 64

## 2014-01-01 DIAGNOSIS — C3490 Malignant neoplasm of unspecified part of unspecified bronchus or lung: Secondary | ICD-10-CM

## 2014-01-01 DIAGNOSIS — Z7901 Long term (current) use of anticoagulants: Secondary | ICD-10-CM

## 2014-01-01 DIAGNOSIS — I4891 Unspecified atrial fibrillation: Secondary | ICD-10-CM

## 2014-01-01 DIAGNOSIS — J449 Chronic obstructive pulmonary disease, unspecified: Secondary | ICD-10-CM

## 2014-01-01 LAB — POCT INR: INR: 1.6

## 2014-01-01 MED ORDER — TIOTROPIUM BROMIDE MONOHYDRATE 2.5 MCG/ACT IN AERS: INHALATION_SPRAY | RESPIRATORY_TRACT | Status: DC

## 2014-01-01 MED ORDER — TIOTROPIUM BROMIDE MONOHYDRATE 18 MCG IN CAPS: ORAL_CAPSULE | RESPIRATORY_TRACT | Status: DC

## 2014-01-01 NOTE — Progress Notes (Signed)
Mr. Kane has received 9 fractions to his right upper lung.  He reports that he feels less SOB and his O2 Sat, today is 99% on RA.  No evidence of erythema, tanning, nor dryness in the tx field, but encouraged to use Biafine BID.  Instructions given in prescence of his son. Mr Apuzzo states he "feels a little stronger" since the start of radiation.

## 2014-01-01 NOTE — Progress Notes (Signed)
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 minimal mucoid sputum in am, no noct exac but still using lots of ventolin and concerned he's using too much.  PFT's with 23% better p B2  rec Continue Dulera but increase the strength to the 200 and use it 2 puffs first thing in am and 2 puffs again in pm about 12 hours later    08/10/2012 f/u ov/Nyeemah Jennette cc no change doe x one  Block, uses saba avg once a day when going across a parking lot. rec Refer to rehab> declined   11/16/2012 f/u ov/Amol Domanski cc sob, did not want to do rehab, min use of saba off spiriva for three weeks and no change in activity tolerance which remains acceptable though he is very sedentary. rec No change rx F/u cxr in 3 mo> Attempted to call pt x 3 to make next ov per recall.  Pt never returned calls.  Mailed recall letter 02/21/13.   07/03/2013 f/u ov/Bettylee Feig re copd GOLD III Chief Complaint  Patient presents with  . Follow-up    Pt states breathing worse for the past 3 months. He states gets out of breath with minimal exertion such as walking 1/2 block on flat surface.   still taking symbicort 160 2bid  stl bloody mucus in am's first noted x 2 weeks waxes  rec Continue symbiocrt 160 Take 2 puffs first thing in am and then another 2 puffs about 12 hours later.  Add tudorza one puff twice daily after symbicort Prednisone 10 mg take  4 each am x 2 days,   2 each am x 2 days,  1 each am x 2 days and stop    07/17/2013 f/u ov/Shyenne Maggard re  copd/ abn cxr  Chief Complaint  Patient presents with  . COPD    f/u. Pt states he feels some inprovement in SOB since taking the tudorza. Pt states he still has some chest congestion and SOB with activity.    not really clear whether tudorza helped vs effects of short course of prednisone as worse off prednisone back near baseline in terms of sob. No more hemoptysis but continues to cough up slt discolored sputum up to a couple of tbsp per day esp in am rec Stop tudorza  Augmentin 875 twice daily x 10 days Prednisone 10 mg take  4 each am x 2 days,   2 each am x 2 days,  1 each am x 2 days and stop   08/16/2013 f/u ov/Cloma Rahrig re copd/ lung mass Chief Complaint  Patient presents with  . Follow-up    Pt had ct chest this am. Reports breathing is the same and denies any new co's today.   rec Fob 08/21/13 > extrinsic compression Bronchus intermedius but neg cyt on wang      08/30/2013 f/u ov/Farryn Linares re: f/u lung nodules/ ab flare Chief Complaint  Patient presents with  . Acute Visit  Pt c/o increased SOB since last dose of pred on 08/22/13-using rescue inhaler approx 3 times daily. He also c/o increased cough x several days- prod with minimal to moderate light yellow sputum.  rec Prednisone 10 mg 2 daily until better then 1 daily   09/27/2013 f/u ov/Kyliyah Stirn re: sob/ lung nodule Chief Complaint  Patient presents with  . Follow-up    Pt states that his breathing has not improved since last visit. Cough is some better. Using rescue inhaler twice per day.   cough/ congestion better on 20 of pred worse on 10  X 5d so back up to 20 mg daily Doe x grocery shopping ok no [employer], no better on pred Variable traces of hemoptysis rec   referred to Dr Lamonte Sakai for EBUS > adenoca  10/31/13 > Stage IV on w/u> RT only   Admit date: 12/05/2013  Discharge date: 12/10/2013   Discharge Diagnoses:  Principal Problem:  HCAP (healthcare-associated pneumonia)  Active Problems:  DM  HYPERLIPIDEMIA  COPD GOLD  III/IV  Adenocarcinoma, lung  Atrial fibrillation with controlled ventricular response  Essential hypertension  Secondary cardiomyopathy, unspecified  Sepsis(995.91)        01/01/2014 post hosp f/u ov/Treyveon Mochizuki re: GOLD III/IV copd/ lung ca  Chief Complaint  Patient presents with  . HFU     Pt states breathing is much improved since hospital d/c. No new co's today.    does grocery stores ok, mailbox and back ok, saba neb bid  Tapered off pred x one week prior to OV  s flare of any cough or wheeze so far Confused with how/when to use his backup saba hfa/neb  No obvious pattern to day to day or daytime variabilty or  cp or chest tightness, subjective wheeze overt sinus or hb symptoms. No unusual exp hx      Sleeping ok without nocturnal  or early am exacerbation  of respiratory  c/o's or need for noct saba. Also denies any obvious fluctuation of symptoms with weather or environmental changes or other aggravating or alleviating factors except as outlined above    Current Medications, Allergies, Complete Past Medical History, Past Surgical History, Family History, and Social History were reviewed in [employer] record.  ROS  The following are not active complaints unless bolded sore throat, dysphagia, dental problems, itching, sneezing,  nasal congestion or excess/ purulent secretions, ear ache,   fever, chills, sweats, unintended wt loss, pleuritic or exertional cp, hemoptysis,  orthopnea pnd or leg swelling, presyncope, palpitations, heartburn, abdominal pain, anorexia, nausea, vomiting, diarrhea  or change in bowel or urinary habits, change in stools or urine, dysuria,hematuria,  rash, arthralgias, visual complaints, headache, numbness weakness or ataxia or problems with walking or coordination,  change in mood/affect or memory.           Past Medical History:  HYPERLIPIDEMIA (ICD-272.4)  DM (ICD-250.00)  COPD (ICD-496)  - HFA 90% 07/06/2011  - PFT's August 13, 2010 FEV1 .93 (34%) ratio 37 with 23 % better p B2 DLC0 69%  - RUL Nodule first detected 07/06/2011    Social History:  Smoking Status: quit  Packs/Day: 2.0  Alcohol drinks/day: 0    Family History ? Lung Ca Brother  ? Brain Ca Sister        Objective:   Physical Exam  Amb pleasant but increasingly frail elderly hoarse wm nad  wt 189 July 16, 2010 >    194 09/24/2011 >> 08/10/2012  182> 174 11/16/2012 > 07/03/2013  174 > 07/17/2013  181 >  08/16/2013  179 > 08/30/2013  176 > 09/27/2013   174  > 172 01/01/14 HEENT mild turbinate edema. Oropharynx edentuous with dentures in place but no thrush or excess pnd or cobblestoning. No JVD or cervical adenopathy. Mild accessory muscle hypertrophy. Trachea midline, nl thryroid. Chest was hyperinflated by percussion with diminished breath sounds and moderate increased exp time with prominent large airway sounding bilateral insp / exp rhonchi. Hoover sign positive at mid inspiration. Regular rate and rhythm without murmur gallop or rub or increase P2 or edema. Abd: no hsm, nl excursion. Ext warm without cyanosis or clubbing             CXR  01/01/2014 :   1. No acute cardiopulmonary disease. There has been improvement in patchy airspace disease in the right hilar and infrahilar region compared to 12/05/2013 likely reflecting a either resolving pneumonia, atelectasis or pneumonitis. 2. Slightly interval decrease in the more medial and larger of the 2 right upper lobe lung lesions suggesting interval response to therapy. 3. The smaller and more lateral lesion remains essentially unchanged.    Assessment:

## 2014-01-01 NOTE — Progress Notes (Signed)
Weekly Management Note Current Dose: 16.2  Gy  Projected Dose: 59.4 Gy   Narrative:  The patient presents for routine under treatment assessment.  CBCT/MVCT images/Port film x-rays were reviewed.  The chart was checked. Doing well. Feeling better and more energy since starting RT.  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 Requested that I look at his wife's scans to see if radiation can help  Physical Findings: Weight: 171 lb 11.2 oz (77.883 kg). Unchanged  Impression:  The patient is tolerating radiation.  Plan:  Continue treatment as planned. Continue biafene. Discussed wife's films with him and son. Unfortunately since she is not mobil and given the extent of her disease, radiation is likely to make her worse from a GI standpoint before helping her back/right flank pain.

## 2014-01-01 NOTE — Telephone Encounter (Signed)
I have made leslie aware that pt will need qualifying sats. Rhodhiss Bing, CMA

## 2014-01-01 NOTE — Patient Instructions (Addendum)
Plan A = Automatic= symbicort 160 2 every 12 hours plus spiriva 2puffs each am and stop tudorza  Plan B = Backup = Only use your albuterol as a rescue medication to be used if you can't catch your breath by resting or doing a relaxed purse lip breathing pattern.  - The less you use it, the better it will work when you need it. - Ok to use up to 2 puffs  every 4 hours if you must but call for immediate appointment if use goes up over your usual need - Don't leave home without it !!  (think of it like the spare tire for your car)   Plan C = Crisis = use nebulizer up to every 4 hours   Plan D= Doctor > call right away if need for the back up increases   Plan E = ER go there if all else fails   For cough /congestion > mucinex dm 1200 mg every 12 hours as needed  Please remember to go to the  x-ray department downstairs for your tests - we will call you with the results when they are available.    Please schedule a follow up office visit in 6 weeks, call sooner if needed

## 2014-01-01 NOTE — Progress Notes (Signed)
Quick Note:  Spoke with pt and notified of results per Dr. Wert. Pt verbalized understanding and denied any questions.  ______ 

## 2014-01-02 ENCOUNTER — Ambulatory Visit: Payer: Medicare Other

## 2014-01-02 ENCOUNTER — Ambulatory Visit
Admission: RE | Admit: 2014-01-02 | Discharge: 2014-01-02 | Disposition: A | Discharge status: Home / Self Care | Payer: Medicare Other | Source: Ambulatory Visit | Attending: Radiation Oncology | Admitting: Radiation Oncology

## 2014-01-02 ENCOUNTER — Ambulatory Visit: Payer: Medicare Other | Admitting: Radiation Oncology

## 2014-01-03 ENCOUNTER — Ambulatory Visit
Admission: RE | Admit: 2014-01-03 | Discharge: 2014-01-03 | Disposition: A | Discharge status: Home / Self Care | Payer: Medicare Other | Source: Ambulatory Visit | Attending: Radiation Oncology | Admitting: Radiation Oncology

## 2014-01-03 ENCOUNTER — Ambulatory Visit: Payer: Medicare Other

## 2014-01-03 NOTE — Assessment & Plan Note (Signed)
-   See cxr 07/06/2011 >  No change 09/24/11 > no change 03/27/2012 > ? slt progression 11/16/2012 > def progression 07/03/2013 - CT chest 08/16/13 1. Aggressive appearing right upper lobe pulmonary mass and nodule  with a right hilar lymphadenopathy, as detailed above. This is  favored to represent a primary bronchogenic neoplasm with nodal  metastasis to the right hilum and satellite nodule in the same lobe  (i.e., T3, N1, Mx disease), however, this could alternatively  represent synchronous right upper lobe neoplasms, or less likely  an aggressive atypical infectious process such as a fungal  pneumonia (not strongly favored). Clinical correlation is strongly  recommended, with consideration for PET CT and/or biopsy at this  time.  2. In addition, there is a nonspecific 1.7 x 1.4 cm ground-glass  attenuation nodule in the left lower lobe which warrants attention  on follow-up studies - fob 08/21/13 > extrinsic compression of BI/ wang and tbbx > neg cytology > referred to Dr Lamonte Sakai for EBN > adenoca  10/31/13 > started RT 12/18/13 > ok to continue

## 2014-01-03 NOTE — Assessment & Plan Note (Signed)
-   PFT's August 13, 2010 FEV1 .93 (34%) ratio 37 with 23 % better p B2 DLC0 69%  - PFT's 03/27/2012  FEV1  0.84 (31%) ratio 36 and 28% better p B2,  DLC0 67% - 08/10/2012  Walked RA x 3 laps @ 185 ft each stopped due to end of study, no desat - referred to rehab 08/10/2012  > declined - hfa 75% 07/03/2013 > added tudorza ? Response > try off 07/17/2013 > worse so restarted - Daily pred rx started 09/01/13 > stopped 12/24/13 - trial of spiriva respimat 01/01/2014 >>>  So far so good off prednisone but doesn't like tudorza cost and taste  The proper method of use, as well as anticipated side effects, of a metered-dose inhaler are discussed and demonstrated to the patient. Improved effectiveness after extensive coaching during this visit to a level of approximately  90% so try spriva respimat as is LAMA like tudorza but not a powder.    Each maintenance medication was reviewed in detail including most importantly the difference between maintenance and as needed and under what circumstances the prns are to be used.  Please see instructions for details which were reviewed in writing and the patient given a copy.

## 2014-01-04 ENCOUNTER — Ambulatory Visit
Admission: RE | Admit: 2014-01-04 | Discharge: 2014-01-04 | Disposition: A | Discharge status: Home / Self Care | Payer: Medicare Other | Source: Ambulatory Visit | Attending: Radiation Oncology | Admitting: Radiation Oncology

## 2014-01-04 ENCOUNTER — Ambulatory Visit: Payer: Medicare Other

## 2014-01-04 ENCOUNTER — Ambulatory Visit: Payer: Medicare Other | Admitting: Radiation Oncology

## 2014-01-07 ENCOUNTER — Ambulatory Visit: Payer: Medicare Other | Admitting: Radiation Oncology

## 2014-01-07 ENCOUNTER — Ambulatory Visit: Payer: Medicare Other

## 2014-01-07 ENCOUNTER — Ambulatory Visit
Admission: RE | Admit: 2014-01-07 | Discharge: 2014-01-07 | Disposition: A | Discharge status: Home / Self Care | Payer: Medicare Other | Source: Ambulatory Visit | Attending: Radiation Oncology | Admitting: Radiation Oncology

## 2014-01-08 ENCOUNTER — Ambulatory Visit
Admission: RE | Admit: 2014-01-08 | Discharge: 2014-01-08 | Disposition: A | Discharge status: Home / Self Care | Payer: Medicare Other | Source: Ambulatory Visit | Attending: Radiation Oncology | Admitting: Radiation Oncology

## 2014-01-08 ENCOUNTER — Encounter: Payer: Self-pay | Admitting: Radiation Oncology

## 2014-01-08 ENCOUNTER — Ambulatory Visit: Payer: Medicare Other

## 2014-01-08 VITALS — BP 129/86 | HR 94 | Temp 97.8°F | Ht 68.0 in | Wt 171.8 lb

## 2014-01-08 DIAGNOSIS — C3490 Malignant neoplasm of unspecified part of unspecified bronchus or lung: Secondary | ICD-10-CM

## 2014-01-08 NOTE — Progress Notes (Signed)
Brent Wade is receiving radiation therapy to is RUL.  His skin in the tx field is without any irritation and skin is soft and intact.  He reports that his breathing is "better"and today his O2 sat is 96 % on RA while sitting.  He denies any pain today in the chest region.

## 2014-01-08 NOTE — Progress Notes (Signed)
Weekly Management Note Current Dose:29.7 Gy  Projected Dose:54 Gy   Narrative:  The patient presents for routine under treatment assessment.  CBCT/MVCT images/Port film x-rays were reviewed.  The chart was checked. Doing well. Feeling better. Can drive himself to treatment now. Working a little bit. No cough or skin redness.   Physical Findings:  Unchanged. Skin is intact.   Vitals:  Filed Vitals:   01/08/14 1416  BP: 129/86  Pulse: 94  Temp: 97.8 F (36.6 C)   Weight:  Wt Readings from Last 3 Encounters:  01/08/14 171 lb 12.8 oz (77.928 kg)  01/01/14 172 lb (78.019 kg)  01/01/14 171 lb 11.2 oz (77.883 kg)   Lab Results  Component Value Date   WBC 10.4 12/06/2013   HGB 12.0* 12/06/2013   HCT 36.2* 12/06/2013   MCV 92.1 12/06/2013   PLT 165 12/06/2013   Lab Results  Component Value Date   CREATININE 1.01 12/05/2013   BUN 14 12/05/2013   NA 137 12/05/2013   K 3.9 12/05/2013   CL 99 12/05/2013   CO2 26 12/05/2013     Impression:  The patient is tolerating radiation.  Plan:  Continue treatment as planned.

## 2014-01-09 ENCOUNTER — Ambulatory Visit: Payer: Medicare Other | Admitting: Radiation Oncology

## 2014-01-09 ENCOUNTER — Ambulatory Visit: Payer: Medicare Other

## 2014-01-09 ENCOUNTER — Ambulatory Visit
Admission: RE | Admit: 2014-01-09 | Discharge: 2014-01-09 | Disposition: A | Discharge status: Home / Self Care | Payer: Medicare Other | Source: Ambulatory Visit | Attending: Radiation Oncology | Admitting: Radiation Oncology

## 2014-01-09 ENCOUNTER — Ambulatory Visit (INDEPENDENT_AMBULATORY_CARE_PROVIDER_SITE_OTHER): Payer: Medicare Other | Admitting: Pharmacist Clinician (PhC)/ Clinical Pharmacy Specialist

## 2014-01-09 VITALS — BP 118/64 | HR 76

## 2014-01-09 DIAGNOSIS — Z7901 Long term (current) use of anticoagulants: Secondary | ICD-10-CM

## 2014-01-09 DIAGNOSIS — I4891 Unspecified atrial fibrillation: Secondary | ICD-10-CM

## 2014-01-09 LAB — POCT INR: INR: 1.6

## 2014-01-10 ENCOUNTER — Ambulatory Visit: Payer: Medicare Other

## 2014-01-10 ENCOUNTER — Ambulatory Visit
Admission: RE | Admit: 2014-01-10 | Discharge: 2014-01-10 | Disposition: A | Discharge status: Home / Self Care | Payer: Medicare Other | Source: Ambulatory Visit | Attending: Radiation Oncology | Admitting: Radiation Oncology

## 2014-01-11 ENCOUNTER — Ambulatory Visit: Payer: Medicare Other

## 2014-01-11 ENCOUNTER — Ambulatory Visit
Admission: RE | Admit: 2014-01-11 | Discharge: 2014-01-11 | Disposition: A | Discharge status: Home / Self Care | Payer: Medicare Other | Source: Ambulatory Visit | Attending: Radiation Oncology | Admitting: Radiation Oncology

## 2014-01-14 ENCOUNTER — Ambulatory Visit: Payer: Medicare Other

## 2014-01-14 ENCOUNTER — Telehealth: Payer: Self-pay | Admitting: Internal Medicine

## 2014-01-14 ENCOUNTER — Encounter: Payer: Self-pay | Admitting: Internal Medicine

## 2014-01-14 DIAGNOSIS — G4734 Idiopathic sleep related nonobstructive alveolar hypoventilation: Secondary | ICD-10-CM | POA: Insufficient documentation

## 2014-01-14 NOTE — Telephone Encounter (Signed)
Per MW-ONO shows minimal desat, so can do without o2 or we can order if the pt would like  Spoke with pt and notified of results per Dr. Melvyn Novas. Pt verbalized understanding and denied any questions. He prefers no noct o2 and will call if needed

## 2014-01-15 ENCOUNTER — Ambulatory Visit: Payer: Medicare Other

## 2014-01-15 ENCOUNTER — Encounter: Payer: Self-pay | Admitting: Radiation Oncology

## 2014-01-15 ENCOUNTER — Ambulatory Visit
Admission: RE | Admit: 2014-01-15 | Discharge: 2014-01-15 | Disposition: A | Discharge status: Home / Self Care | Payer: Medicare Other | Source: Ambulatory Visit | Attending: Radiation Oncology | Admitting: Radiation Oncology

## 2014-01-15 VITALS — BP 148/85 | HR 86 | Resp 16 | Wt 169.4 lb

## 2014-01-15 DIAGNOSIS — C3490 Malignant neoplasm of unspecified part of unspecified bronchus or lung: Secondary | ICD-10-CM

## 2014-01-15 NOTE — Progress Notes (Signed)
Reports SOB has improved. Wet sounding cough noted but, not productive. Reports he feels like he has "a frog in his throat." Reports tenderness with swallowing but, reports "its nothing he can't handle." Denies nausea, vomiting, headache, dizziness, or night sweats. Weight stable. Reports skin of back and chest remain normal in appearance. Reports using radiaplex bid.

## 2014-01-15 NOTE — Progress Notes (Signed)
Weekly Management Note Current Dose:  40.5 Gy  Projected Dose: 59.4 Gy   Narrative:  The patient presents for routine under treatment assessment.  CBCT/MVCT images/Port film x-rays were reviewed.  The chart was checked. Doing well. Some non productive cough. No fevers. Driving himself.   Physical Findings: Weight: 169 lb 6.4 oz (76.839 kg). Unchanged. No skin changes.  Impression:  The patient is tolerating radiation.  Plan:  Continue treatment as planned.

## 2014-01-16 ENCOUNTER — Ambulatory Visit
Admission: RE | Admit: 2014-01-16 | Discharge: 2014-01-16 | Disposition: A | Discharge status: Home / Self Care | Payer: Medicare Other | Source: Ambulatory Visit | Attending: Radiation Oncology | Admitting: Radiation Oncology

## 2014-01-16 ENCOUNTER — Ambulatory Visit: Payer: Medicare Other

## 2014-01-17 ENCOUNTER — Ambulatory Visit
Admission: RE | Admit: 2014-01-17 | Discharge: 2014-01-17 | Disposition: A | Discharge status: Home / Self Care | Payer: Medicare Other | Source: Ambulatory Visit | Attending: Radiation Oncology | Admitting: Radiation Oncology

## 2014-01-17 ENCOUNTER — Ambulatory Visit: Payer: Medicare Other

## 2014-01-18 ENCOUNTER — Ambulatory Visit: Payer: Medicare Other

## 2014-01-18 ENCOUNTER — Ambulatory Visit
Admission: RE | Admit: 2014-01-18 | Discharge: 2014-01-18 | Disposition: A | Discharge status: Home / Self Care | Payer: Medicare Other | Source: Ambulatory Visit | Attending: Radiation Oncology | Admitting: Radiation Oncology

## 2014-01-21 ENCOUNTER — Ambulatory Visit
Admission: RE | Admit: 2014-01-21 | Discharge: 2014-01-21 | Disposition: A | Discharge status: Home / Self Care | Payer: Medicare Other | Source: Ambulatory Visit | Attending: Radiation Oncology | Admitting: Radiation Oncology

## 2014-01-21 ENCOUNTER — Ambulatory Visit: Payer: Medicare Other

## 2014-01-22 ENCOUNTER — Encounter: Payer: Self-pay | Admitting: Radiation Oncology

## 2014-01-22 ENCOUNTER — Ambulatory Visit
Admission: RE | Admit: 2014-01-22 | Discharge: 2014-01-22 | Disposition: A | Discharge status: Home / Self Care | Payer: Medicare Other | Source: Ambulatory Visit | Attending: Radiation Oncology | Admitting: Radiation Oncology

## 2014-01-22 ENCOUNTER — Ambulatory Visit: Payer: Medicare Other

## 2014-01-22 VITALS — BP 125/83 | HR 84 | Resp 18 | Wt 167.6 lb

## 2014-01-22 DIAGNOSIS — C3490 Malignant neoplasm of unspecified part of unspecified bronchus or lung: Secondary | ICD-10-CM

## 2014-01-22 NOTE — Progress Notes (Signed)
Denies skin changes to chest or back. Reports using biafine cream as directed. Reports a runny nose. Reports an occasional cough that is only productive in the mornings. Reports slight difficulty swallowing that is manageable. Patient explains that he cuts his food in smaller portions. Reports shortness of breath continues but, is no worse.

## 2014-01-22 NOTE — Progress Notes (Signed)
Weekly Management Note Current Dose: 54  Gy  Projected Dose: 59.4 Gy   Narrative:  The patient presents for routine under treatment assessment.  CBCT/MVCT images/Port film x-rays were reviewed.  The chart was checked. Feeling well. No complaints. Might be feeling that he needs to take smaller bites of food but unsure.  Physical Findings: Weight: 167 lb 9.6 oz (76.023 kg). Unchanged. No skin changes. Appears well.  Impression:  The patient is tolerating radiation.  Plan:  Continue treatment as planned.

## 2014-01-23 ENCOUNTER — Other Ambulatory Visit: Payer: Self-pay

## 2014-01-23 ENCOUNTER — Ambulatory Visit (INDEPENDENT_AMBULATORY_CARE_PROVIDER_SITE_OTHER): Payer: Medicare Other | Admitting: Pharmacist Clinician (PhC)/ Clinical Pharmacy Specialist

## 2014-01-23 ENCOUNTER — Ambulatory Visit: Payer: Medicare Other

## 2014-01-23 ENCOUNTER — Ambulatory Visit
Admission: RE | Admit: 2014-01-23 | Discharge: 2014-01-23 | Disposition: A | Discharge status: Home / Self Care | Payer: Medicare Other | Source: Ambulatory Visit | Attending: Radiation Oncology | Admitting: Radiation Oncology

## 2014-01-23 VITALS — BP 100/60 | HR 84

## 2014-01-23 DIAGNOSIS — I4891 Unspecified atrial fibrillation: Secondary | ICD-10-CM

## 2014-01-23 DIAGNOSIS — Z7901 Long term (current) use of anticoagulants: Secondary | ICD-10-CM

## 2014-01-23 LAB — POCT INR: INR: 2.4

## 2014-01-23 MED ORDER — ALBUTEROL SULFATE HFA 108 (90 BASE) MCG/ACT IN AERS: 2.0000 | INHALATION_SPRAY | Freq: Four times a day (QID) | RESPIRATORY_TRACT | Status: DC | PRN

## 2014-01-24 ENCOUNTER — Ambulatory Visit: Payer: Medicare Other

## 2014-01-24 ENCOUNTER — Telehealth: Payer: Self-pay | Admitting: Internal Medicine

## 2014-01-24 ENCOUNTER — Encounter: Payer: Self-pay | Admitting: Radiation Oncology

## 2014-01-24 ENCOUNTER — Ambulatory Visit
Admission: RE | Admit: 2014-01-24 | Discharge: 2014-01-24 | Disposition: A | Discharge status: Home / Self Care | Payer: Medicare Other | Source: Ambulatory Visit | Attending: Radiation Oncology | Admitting: Radiation Oncology

## 2014-01-24 NOTE — Telephone Encounter (Signed)
Spoke with the pt's relative and made aware that 2 boxes symbicort left up front  She verbalized understanding and nothing further needed

## 2014-01-25 ENCOUNTER — Ambulatory Visit: Payer: Medicare Other

## 2014-01-28 ENCOUNTER — Ambulatory Visit: Payer: Medicare Other

## 2014-01-29 ENCOUNTER — Ambulatory Visit: Payer: Medicare Other

## 2014-01-30 ENCOUNTER — Ambulatory Visit: Payer: Medicare Other

## 2014-01-31 NOTE — Progress Notes (Signed)
  Radiation Oncology         [phone] ________________________________  Name: Brent Wade MRN: [id number]  Date: 01/24/2014  DOB: June 06, 1941  End of Treatment Note  Diagnosis:   Synchronous primary lung cancers     Indication for treatment:  Curative       Radiation treatment dates:   12/25/13-01/24/14  Site/dose:   Right upper lobe posterior lesion 54 Gy in 3 fractions at 18 Gy per fraction via SBRT technique followed by 59.4 Gy to the anterior lesion with an IMRT technique bring the total dose to the anterior lesion with the contributing dose from the SBRT to over 70 Gy   Beams/energy:   SBRT was used with a Rapid Arc plan delivered in 2 arcs with 6X MV photons in FFF mode. The IMRT plan was also delivered using 2 arcs and 6MV photons.   Narrative: The patient tolerated radiation treatment relatively well.   He was hospitalized early on in his treatment for pneumonia. After recovering from that, he felt much better and was able to drive himself to treatments.   Plan: The patient has completed radiation treatment. The patient will return to radiation oncology clinic for routine followup in one month. I advised them to call or return sooner if they have any questions or concerns related to their recovery or treatment.  ------------------------------------------------  Thea Silversmith, MD

## 2014-02-04 ENCOUNTER — Other Ambulatory Visit: Payer: Self-pay | Admitting: Hematology and Oncology

## 2014-02-12 ENCOUNTER — Ambulatory Visit (INDEPENDENT_AMBULATORY_CARE_PROVIDER_SITE_OTHER): Payer: Medicare Other | Admitting: Internal Medicine

## 2014-02-12 ENCOUNTER — Encounter: Payer: Self-pay | Admitting: Internal Medicine

## 2014-02-12 VITALS — BP 120/80 | HR 85 | Temp 98.0°F | Ht 68.0 in | Wt 166.4 lb

## 2014-02-12 DIAGNOSIS — J449 Chronic obstructive pulmonary disease, unspecified: Secondary | ICD-10-CM

## 2014-02-12 MED ORDER — ACLIDINIUM BROMIDE 400 MCG/ACT IN AEPB: 1.0000 | INHALATION_SPRAY | Freq: Two times a day (BID) | RESPIRATORY_TRACT | Status: DC

## 2014-02-12 NOTE — Patient Instructions (Addendum)
Plan A = Automatic= symbicort 160 2 every 12 hours plus tudoraza one Twice daily   Plan B = Backup = Only use your albuterol as a rescue medication to be used if you can't catch your breath by resting or doing a relaxed purse lip breathing pattern.  - The less you use it, the better it will work when you need it. - Ok to use up to 2 puffs  every 4 hours if you must but call for immediate appointment if use goes up over your usual need - Don't leave home without it !!  (think of it like the spare tire for your car)   Plan C = Crisis = use nebulizer up to every 4 hours if Plan B no working   For cough /congestion > mucinex dm 1200 mg every 12 hours as needed    Please schedule a follow up visit in 3 months but call sooner if needed

## 2014-02-12 NOTE — Progress Notes (Signed)
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 minimal mucoid sputum in am, no noct exac but still using lots of ventolin and concerned he's using too much.  PFT's with 23% better p B2  rec Continue Dulera but increase the strength to the 200 and use it 2 puffs first thing in am and 2 puffs again in pm about 12 hours later    08/10/2012 f/u ov/[name] cc no change doe x one  Block, uses saba avg once a day when going across a parking lot. rec Refer to rehab> declined   11/16/2012 f/u ov/Concettina Leth cc sob, did not want to do rehab, min use of saba off spiriva for three weeks and no change in activity tolerance which remains acceptable though he is very sedentary. rec No change rx F/u cxr in 3 mo> Attempted to call pt x 3 to make next ov per recall.  Pt never returned calls.  Mailed recall letter 02/21/13.   07/03/2013 f/u ov/[name] re copd GOLD III Chief Complaint  Patient presents with  . Follow-up    Pt states breathing worse for the past 3 months. He states gets out of breath with minimal exertion such as walking 1/2 block on flat surface.   still taking symbicort 160 2bid  stl bloody mucus in am's first noted x 2 weeks waxes  rec Continue symbiocrt 160 Take 2 puffs first thing in am and then another 2 puffs about 12 hours later.  Add tudorza one puff twice daily after symbicort Prednisone 10 mg take  4 each am x 2 days,   2 each am x 2 days,  1 each am x 2 days and stop    07/17/2013 f/u ov/[name] re  copd/ abn cxr  Chief Complaint  Patient presents with  . COPD    f/u. Pt states he feels some inprovement in SOB since taking the tudorza. Pt states he still has some chest congestion and SOB with activity.    not really clear whether tudorza helped vs effects of short course of prednisone as worse off prednisone back near baseline in terms of sob. No more hemoptysis but continues to cough up slt discolored sputum up to a couple of tbsp per day esp in am rec Stop tudorza  Augmentin 875 twice daily x 10 days Prednisone 10 mg take  4 each am x 2 days,   2 each am x 2 days,  1 each am x 2 days and stop   08/16/2013 f/u ov/[name] re copd/ lung mass Chief Complaint  Patient presents with  . Follow-up    Pt had ct chest this am. Reports breathing is the same and denies any new co's today.   rec Fob 08/21/13 > extrinsic compression Bronchus intermedius but neg cyt on wang      08/30/2013 f/u ov/[name] re: f/u lung nodules/ ab flare Chief Complaint  Patient presents with  . Acute Visit  Pt c/o increased SOB since last dose of pred on 08/22/13-using rescue inhaler approx 3 times daily. He also c/o increased cough x several days- prod with minimal to moderate light yellow sputum.  rec Prednisone 10 mg 2 daily until better then 1 daily   09/27/2013 f/u ov/[name] re: sob/ lung nodule Chief Complaint  Patient presents with  . Follow-up    Pt states that his breathing has not improved since last visit. Cough is some better. Using rescue inhaler twice per day.   cough/ congestion better on 20 of pred worse on 10  X 5d so back up to 20 mg daily Doe x grocery shopping ok no [employer], no better on pred Variable traces of hemoptysis rec   referred to Dr Lamonte Sakai for EBUS > adenoca  10/31/13 > Stage IV on w/u> RT only   Admit date: 12/05/2013  Discharge date: 12/10/2013   Discharge Diagnoses:  Principal Problem:  HCAP (healthcare-associated pneumonia)  Active Problems:  DM  HYPERLIPIDEMIA  COPD GOLD  III/IV  Adenocarcinoma, lung  Atrial fibrillation with controlled ventricular response  Essential hypertension  Secondary cardiomyopathy, unspecified  Sepsis(995.91)        01/01/2014 post hosp f/u ov/[name] re: GOLD III/IV copd/ lung ca  Chief Complaint  Patient presents with  . HFU     Pt states breathing is much improved since hospital d/c. No new co's today.    does grocery stores ok, mailbox and back ok, saba neb bid  Tapered off pred x one week prior to OV  s flare of any cough or wheeze so far Confused with how/when to use his backup saba hfa/neb rec Plan A = Automatic= symbicort 160 2 every 12 hours plus spiriva 2puffs each am and stop tudorza  Plan B = Backup = Only use your albuterol as a rescue medication    Plan C = Crisis = use nebulizer up to every 4 hours  Plan D= Doctor > call right away if need for the back up increases  Plan E = ER go there if all else fails  For cough /congestion > mucinex dm 1200 mg every 12 hours as needed   02/12/2014 f/u ov/[name] re: copd/ ab stopped spiriva maint symbicort 160 2bid and uses ventolin 1pm daily Chief Complaint  Patient presents with  . Follow-up    Pt states that his breathing is unchanged. He is using rescue inhaler at least once per day due to wheezing. He very seldom uses neb.    No obvious day to day or daytime variabilty or assoc chronic cough or cp or chest tightness, subjective wheeze overt sinus or hb symptoms. No unusual exp hx or h/o childhood pna/ asthma or knowledge of premature birth.  Sleeping ok without nocturnal  or early am exacerbation  of respiratory  c/o's or need for noct saba. Also denies any obvious fluctuation of symptoms with weather or environmental changes or other aggravating or alleviating factors except as outlined above   Current Medications, Allergies, Complete Past Medical History, Past Surgical History, Family History, and Social History were reviewed in [employer]  record.  ROS  The following are not active complaints unless bolded sore throat, dysphagia, dental problems, itching, sneezing,  nasal congestion or excess/ purulent secretions, ear ache,   fever, chills, sweats, unintended wt loss, pleuritic or exertional cp, hemoptysis,  orthopnea pnd or leg swelling, presyncope, palpitations, heartburn, abdominal pain, anorexia, nausea, vomiting, diarrhea  or change in bowel or urinary habits, change  in stools or urine, dysuria,hematuria,  rash, arthralgias, visual complaints, headache, numbness weakness or ataxia or problems with walking or coordination,  change in mood/affect or memory.              Past Medical History:  HYPERLIPIDEMIA (ICD-272.4)  DM (ICD-250.00)  COPD (ICD-496)  - HFA 90% 07/06/2011  - PFT's August 13, 2010 FEV1 .93 (34%) ratio 37 with 23 % better p B2 DLC0 69%  - RUL Nodule first detected 07/06/2011    Social History:  Smoking Status: quit  Packs/Day: 2.0  Alcohol drinks/day: 0    Family History ? Lung Ca Brother  ? Brain Ca Sister        Objective:   Physical Exam  Amb pleasant but increasingly frail elderly hoarse wm nad  wt 189 July 16, 2010 >    194 09/24/2011 >> 08/10/2012  182> 174 11/16/2012 > 07/03/2013 174 > 07/17/2013  181 >  08/16/2013  179 > 08/30/2013  176 > 09/27/2013   174  > 172 01/01/14> 02/12/2014  166  HEENT mild turbinate edema. Oropharynx edentuous with dentures in place but no thrush or excess pnd or cobblestoning. No JVD or cervical adenopathy. Mild accessory muscle hypertrophy. Trachea midline, nl thryroid. Chest was hyperinflated by percussion with diminished breath sounds and moderate increased exp time with prominent large airway sounding bilateral insp / exp rhonchi. Hoover sign positive at mid inspiration. Regular rate and rhythm without murmur gallop or rub or increase P2 or edema. Abd: no hsm, nl excursion. Ext warm without cyanosis or clubbing             CXR  01/01/2014 :   1. No acute  cardiopulmonary disease. There has been improvement in patchy airspace disease in the right hilar and infrahilar region compared to 12/05/2013 likely reflecting a either resolving pneumonia, atelectasis or pneumonitis. 2. Slightly interval decrease in the more medial and larger of the 2 right upper lobe lung lesions suggesting interval response to therapy. 3. The smaller and more lateral lesion remains essentially unchanged.    Assessment:

## 2014-02-13 NOTE — Assessment & Plan Note (Signed)
-   PFT's August 13, 2010 FEV1 .93 (34%) ratio 37 with 23 % better p B2 DLC0 69%  - PFT's 03/27/2012  FEV1  0.84 (31%) ratio 36 and 28% better p B2,  DLC0 67% - 08/10/2012  Walked RA x 3 laps @ 185 ft each stopped due to end of study, no desat - referred to rehab 08/10/2012  > declined - hfa 75% 07/03/2013 > added tudorza ? Response > try off 07/17/2013 > worse so restarted - Daily pred rx started 09/01/13 > stopped 12/24/13  The proper method of use, as well as anticipated side effects, of a metered-dose inhaler are discussed and demonstrated to the patient. Improved effectiveness after extensive coaching during this visit to a level of approximately  90%   Rec  trial of spiriva respimat 2 puffs each am     Each maintenance medication was reviewed in detail including most importantly the difference between maintenance and as needed and under what circumstances the prns are to be used.  Please see instructions for details which were reviewed in writing and the patient given a copy.

## 2014-02-14 ENCOUNTER — Telehealth: Payer: Self-pay | Admitting: Pharmacist Clinician (PhC)/ Clinical Pharmacy Specialist

## 2014-02-14 NOTE — Telephone Encounter (Signed)
Spoke with patient, states he was at Dr. Gustavus Bryant office and was told heart was " all over the place".  Wanted to know if he could see Dr. Ellyn Hack on Monday.  Advised him Dr. Ellyn Hack schedule full, but put him on schedule for Cecilie Kicks NP at 3:20pm

## 2014-02-14 NOTE — Telephone Encounter (Signed)
Pt LMOM stated he had a question

## 2014-02-18 ENCOUNTER — Telehealth: Payer: Self-pay | Admitting: Internal Medicine

## 2014-02-18 ENCOUNTER — Ambulatory Visit (INDEPENDENT_AMBULATORY_CARE_PROVIDER_SITE_OTHER): Payer: Medicare Other | Admitting: Pharmacist Clinician (PhC)/ Clinical Pharmacy Specialist

## 2014-02-18 ENCOUNTER — Ambulatory Visit (INDEPENDENT_AMBULATORY_CARE_PROVIDER_SITE_OTHER): Payer: Medicare Other | Admitting: Cardiology

## 2014-02-18 ENCOUNTER — Ambulatory Visit: Payer: Medicare Other | Admitting: Pharmacist Clinician (PhC)/ Clinical Pharmacy Specialist

## 2014-02-18 ENCOUNTER — Encounter: Payer: Self-pay | Admitting: Cardiology

## 2014-02-18 VITALS — BP 102/64 | HR 105 | Ht 68.0 in | Wt 162.0 lb

## 2014-02-18 DIAGNOSIS — I429 Cardiomyopathy, unspecified: Secondary | ICD-10-CM

## 2014-02-18 DIAGNOSIS — R05 Cough: Secondary | ICD-10-CM

## 2014-02-18 DIAGNOSIS — I4891 Unspecified atrial fibrillation: Secondary | ICD-10-CM

## 2014-02-18 DIAGNOSIS — R000 Tachycardia, unspecified: Secondary | ICD-10-CM

## 2014-02-18 DIAGNOSIS — R059 Cough, unspecified: Secondary | ICD-10-CM

## 2014-02-18 DIAGNOSIS — J449 Chronic obstructive pulmonary disease, unspecified: Secondary | ICD-10-CM

## 2014-02-18 DIAGNOSIS — Z7901 Long term (current) use of anticoagulants: Secondary | ICD-10-CM

## 2014-02-18 DIAGNOSIS — C3490 Malignant neoplasm of unspecified part of unspecified bronchus or lung: Secondary | ICD-10-CM

## 2014-02-18 LAB — POCT INR: INR: 3

## 2014-02-18 MED ORDER — CEPHALEXIN 500 MG PO CAPS: 500.0000 mg | ORAL_CAPSULE | Freq: Two times a day (BID) | ORAL | Status: DC

## 2014-02-18 MED ORDER — DIGOXIN 125 MCG PO TABS: 0.1250 mg | ORAL_TABLET | Freq: Every day | ORAL | Status: DC

## 2014-02-18 NOTE — Assessment & Plan Note (Signed)
HR up to 118 here, with Dr. Melvyn Novas it also was up and down.  Also increased DOE, add Lanoxin 0.125 daily with loading doses of 0.25 today and tomorrow.  Follow up in 2 weeks.  Will plan for TEE DCCV at that time.  His INR was sub therapeutic in Jan.  Discussed with Dr. Ellyn Hack and will have non internationalist perform the DCCV.

## 2014-02-18 NOTE — Assessment & Plan Note (Signed)
Followed by Dr Wert 

## 2014-02-18 NOTE — Assessment & Plan Note (Signed)
Has undergone 25 radiation therapies, waiting to schedule repeat CT scan.

## 2014-02-18 NOTE — Progress Notes (Signed)
02/18/2014   PCP: Simona Huh, MD   Chief Complaint  Patient presents with  . Follow-up    Heart rate issues recently saw Dr. Melvyn Novas last week , pt. always SOB    Primary Cardiologist: Dr. Ellyn Hack  HPI: 73 y.o. male with a complex medical history as noted below who presented today for followup of atrial fibrillation.  He had an ECG showing atrial fibrillation while undergoing preprocedure evaluation for bronchoscopy/microscopic guided biopsy of a lung mass. When Dr. Ellyn Hack saw him on November 17, he was not symptomatic with atrial fibrillation, and essentially denied knowing about it. We started him on warfarin once his biopsy was complete. He was started on low dose  diltiazem for rate control. He had an echocardiogram done that showed mild to moderately reduced EF. The plan was for him to undergo a stress test to evaluate for any ischemic etiology of his atrial fibrillation as well as the reduced ejection fraction. Stress test was without ischemia.  In December the results of his biopsy did confirm lung cancer, has since undergone 25 radiation therapies and is awaiting CT of chest.  Dr. Melvyn Novas noticed his HR was chaotic and asked him to see [place].  Indeed his HR is from 105 to 118, pt with increased SOB.  Also with cough and clear sputum.  He also relates recent fever and believes he needs antibiotic.     No Known Allergies  Current Outpatient Prescriptions  Medication Sig Dispense Refill  . Aclidinium Bromide (TUDORZA PRESSAIR) 400 MCG/ACT AEPB Inhale 1 puff into the lungs 2 (two) times daily. One twice daily  1 each  11  . albuterol (PROVENTIL HFA;VENTOLIN HFA) 108 (90 BASE) MCG/ACT inhaler Inhale 2 puffs into the lungs every 6 (six) hours as needed for wheezing or shortness of breath.  1 Inhaler  6  . albuterol (PROVENTIL) (2.5 MG/3ML) 0.083% nebulizer solution Take 3 mLs (2.5 mg total) by nebulization every 6 (six) hours as needed for wheezing or shortness of breath.   75 mL  12  . budesonide-formoterol (SYMBICORT) 160-4.5 MCG/ACT inhaler Inhale 2 puffs into the lungs 2 (two) times daily.        Marland Kitchen diltiazem (CARDIZEM) 60 MG tablet Take 1 tablet (60 mg total) by mouth 2 (two) times daily.  60 tablet  6  . doxazosin (CARDURA) 2 MG tablet Take 2 mg by mouth daily.        . metFORMIN (GLUCOPHAGE) 500 MG tablet Take 500 mg by mouth 2 (two) times daily with a meal.       . simvastatin (ZOCOR) 40 MG tablet Take 0.5 tablets (20 mg total) by mouth at bedtime.  30 tablet  6  . warfarin (COUMADIN) 2.5 MG tablet Take 2.5 mg by mouth daily. 2.5 mg on Monday and Fridays, and 5 mg on Tues, Wed. Thursday, Saturday and Sunday      . cephALEXin (KEFLEX) 500 MG capsule Take 1 capsule (500 mg total) by mouth 2 (two) times daily.  10 capsule  0  . digoxin (LANOXIN) 0.125 MG tablet Take 1 tablet (0.125 mg total) by mouth daily.  90 tablet  3   No current facility-administered medications for this visit.    Past Medical History  Diagnosis Date  . Hyperlipidemia   . Type II or unspecified type diabetes mellitus without mention of complication, not stated as uncontrolled     twice/week  . Chronic airway obstruction, not elsewhere classified  HFA 25-50% 07-16-2010> 100% 01-28-2011; PFT's 08-13-10 FEV1 .93(34%) ratio 37 with 23% better p B2 DLCO 69%  . Shortness of breath   . Hypertension   . Arthritis     in hands  . Dysrhythmia     Atrial Fibrillation; takes Cardizem  . Lung cancer 11/06/2013    Diagnosed during bronchoscopy  . Atrial fibrillation with RVR 02/18/2014    Past Surgical History  Procedure Laterality Date  . Video bronchoscopy Bilateral 08/21/2013    Procedure: VIDEO BRONCHOSCOPY WITH FLUORO;  Surgeon: Tanda Rockers, MD;  Location: [facility];  Service: Cardiopulmonary;  Laterality: Bilateral;  . 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 Cholecystectomy  1995  . Eye surgery Bilateral     cataracts  . Video bronchoscopy  with endobronchial navigation N/A 10/31/2013    Procedure: VIDEO BRONCHOSCOPY WITH ENDOBRONCHIAL NAVIGATION;  Surgeon: Collene Gobble, MD;  Location: [place];  Service: Thoracic;  Laterality: N/A;  . Video bronchoscopy with endobronchial ultrasound N/A 10/31/2013    Procedure: VIDEO BRONCHOSCOPY WITH ENDOBRONCHIAL ULTRASOUND;  Surgeon: Collene Gobble, MD;  Location: [place];  Service: Thoracic;  Laterality: N/A;  . Transthoracic echocardiogram  10/29/2013    Mild-moderately reduced EF, 40-45%. Diffuse HK. Mild MR. Mild LA/RA dilation; no determination of elevated pulmonary arterial pressures     IOX:BDZHGDJ:ME colds or fevers, no weight changes Skin:no rashes or ulcers HEENT:no blurred vision, no congestion CV:see HPI PUL:see HPI GI:no diarrhea constipation or melena, no indigestion GU:no hematuria, no dysuria MS:no joint pain, no claudication Neuro:no syncope, no lightheadedness Endo:+ diabetes controlled and followed by PCP, no thyroid disease  PHYSICAL EXAM BP 102/64  Pulse 105  Ht 5\' 8"  (1.727 m)  Wt 162 lb (73.483 kg)  BMI 24.64 kg/m2 General:Pleasant affect, NAD Skin:Warm and dry, brisk capillary refill HEENT:normocephalic, sclera clear, mucus membranes moist Neck:supple, no JVD, no bruits  Heart:irreg irreg with 1/6 systolic murmur, no gallup, rub or click Lungs:clear without rales, rhonchi, or wheezes QAS:TMHD, non tender, + BS, do not palpate liver spleen or masses Ext:no lower ext edema, 2+ pedal pulses, 2+ radial pulses Neuro:alert and oriented, MAE, follows commands, + facial symmetry  EKG:a fib with RVR 105 to 118 no acute changes otherwise.  ASSESSMENT AND PLAN Atrial fibrillation with RVR HR up to 118 here, with Dr. Melvyn Novas it also was up and down.  Also increased DOE, add Lanoxin 0.125 daily with loading doses of 0.25 today and tomorrow.  Follow up in 2 weeks.  Will plan for TEE DCCV at that time.  His INR was sub therapeutic in Jan.  Discussed with Dr. Ellyn Hack and  will have non internationalist perform the DCCV.    Secondary cardiomyopathy, unspecified This may be due to atrial fib with RVR, his nuc study was neg for ischemia  Cough Cough -productive with clear sputum, some fever per pt.  I added Keflex 500 mg BID for 5 days, guaifenesin 600 DM BID, hopefull his dyspnea will improve with controlled A fib    Lung cancer Has undergone 25 radiation therapies, waiting to schedule repeat CT scan.  COPD GOLD III Followed by Dr. Melvyn Novas   Unable to increase Cardizem due to soft BP.

## 2014-02-18 NOTE — Telephone Encounter (Signed)
Received fax from [place] stating that pt's Brent Wade is requiring PA Called the number provided on fax >> [phone] and spoke with representative Minerva PA approved over the phone from 3.9.15 thru 12.31.15   Case # [id number]  Called [facility] and spoke with Benjamine Mola to inform her of approval, she was able to see this in their system  Will sign off.

## 2014-02-18 NOTE — Assessment & Plan Note (Signed)
Cough -productive with clear sputum, some fever per pt.  I added Keflex 500 mg BID for 5 days, guaifenesin 600 DM BID, hopefull his dyspnea will improve with controlled A fib

## 2014-02-18 NOTE — Assessment & Plan Note (Signed)
This may be due to atrial fib with RVR, his nuc study was neg for ischemia

## 2014-02-18 NOTE — Patient Instructions (Addendum)
Lanoxin 0.125 mg take 2 tabs today and 2 tabs tomorrow then 1 daily.  Follow up in 2 weeks to discuss cardioversion  Add Musinex over the counter 60 mg twice a day.     Keflex antibiotic for your lungs  Follow up with me or other NP/PA in 2 weeks for follow up

## 2014-02-22 ENCOUNTER — Encounter: Payer: Self-pay | Admitting: Internal Medicine

## 2014-03-04 ENCOUNTER — Ambulatory Visit (INDEPENDENT_AMBULATORY_CARE_PROVIDER_SITE_OTHER): Payer: Medicare Other | Admitting: Pharmacist Clinician (PhC)/ Clinical Pharmacy Specialist

## 2014-03-04 ENCOUNTER — Encounter: Payer: Self-pay | Admitting: Internal Medicine

## 2014-03-04 ENCOUNTER — Ambulatory Visit (INDEPENDENT_AMBULATORY_CARE_PROVIDER_SITE_OTHER): Payer: Medicare Other | Admitting: Cardiology

## 2014-03-04 ENCOUNTER — Encounter: Payer: Self-pay | Admitting: Cardiology

## 2014-03-04 VITALS — BP 118/60 | Ht 68.0 in | Wt 164.0 lb

## 2014-03-04 DIAGNOSIS — D689 Coagulation defect, unspecified: Secondary | ICD-10-CM

## 2014-03-04 DIAGNOSIS — R5381 Other malaise: Secondary | ICD-10-CM

## 2014-03-04 DIAGNOSIS — Z79899 Other long term (current) drug therapy: Secondary | ICD-10-CM

## 2014-03-04 DIAGNOSIS — R5383 Other fatigue: Secondary | ICD-10-CM

## 2014-03-04 DIAGNOSIS — Z7901 Long term (current) use of anticoagulants: Secondary | ICD-10-CM

## 2014-03-04 DIAGNOSIS — I4891 Unspecified atrial fibrillation: Secondary | ICD-10-CM

## 2014-03-04 DIAGNOSIS — R05 Cough: Secondary | ICD-10-CM

## 2014-03-04 DIAGNOSIS — J449 Chronic obstructive pulmonary disease, unspecified: Secondary | ICD-10-CM

## 2014-03-04 DIAGNOSIS — R059 Cough, unspecified: Secondary | ICD-10-CM

## 2014-03-04 DIAGNOSIS — Z01818 Encounter for other preprocedural examination: Secondary | ICD-10-CM

## 2014-03-04 LAB — POCT INR: INR: 2

## 2014-03-04 NOTE — Assessment & Plan Note (Signed)
On coumadin.  Therapeutic today.

## 2014-03-04 NOTE — Assessment & Plan Note (Signed)
Followed by Dr Wert 

## 2014-03-04 NOTE — Assessment & Plan Note (Signed)
Now with addition of Lanoxin rate controlled. Will schedule TEE/DCCV with Dr. Debara Pickett or Dr. Sallyanne Kuster.  INR is therapeutic today.  Pt agreeable to proceed.

## 2014-03-04 NOTE — Progress Notes (Signed)
03/04/2014   PCP: Simona Huh, MD   Chief Complaint  Patient presents with  . Follow-up    per Shresta Risden  . Flu Vaccine    pt. states he has had his flu injection back in september    Primary Cardiologist: Dr. Ellyn Hack  HPI: 73 y.o. male with a complex medical history as noted below who presented 02/18/14 for followup of atrial fibrillation. He had an ECG showing atrial fibrillation while undergoing preprocedure evaluation for bronchoscopy/microscopic guided biopsy of a lung mass 11/14. When Dr. Ellyn Hack saw him on November 17, he was not symptomatic with atrial fibrillation, and essentially denied knowing about it. We started him on warfarin once his biopsy was complete. He was started on low dose diltiazem for rate control. He had an echocardiogram done that showed mild to moderately reduced EF. The plan was for him to undergo a stress test to evaluate for any ischemic etiology of his atrial fibrillation as well as the reduced ejection fraction. Stress test was without ischemia.  In December the results of his biopsy did confirm lung cancer, has since undergone 25 radiation therapies and is awaiting CT of chest.   Dr. Melvyn Novas recently noticed his HR was chaotic and asked him to see [place]. Indeed his HR was from 105 to 118, 02/18/14- pt with increased SOB. Also with cough and clear sputum. He also relates recent fever and believes he needs antibiotic. I placed him on an antibiotic and after discussing plan with Dr. Ellyn Hack added Lanoxin for rate control as BP was borderline.  He is here for follow up today and to schedule a TEE/DCCV.  He is feeling better, less SOB, less cough.  His HR is now controlled as well.      No Known Allergies  Current Outpatient Prescriptions  Medication Sig Dispense Refill  . Aclidinium Bromide (TUDORZA PRESSAIR) 400 MCG/ACT AEPB Inhale 1 puff into the lungs 2 (two) times daily. One twice daily  1 each  11  . albuterol (PROVENTIL HFA;VENTOLIN  HFA) 108 (90 BASE) MCG/ACT inhaler Inhale 2 puffs into the lungs every 6 (six) hours as needed for wheezing or shortness of breath.  1 Inhaler  6  . albuterol (PROVENTIL) (2.5 MG/3ML) 0.083% nebulizer solution Take 3 mLs (2.5 mg total) by nebulization every 6 (six) hours as needed for wheezing or shortness of breath.  75 mL  12  . budesonide-formoterol (SYMBICORT) 160-4.5 MCG/ACT inhaler Inhale 2 puffs into the lungs 2 (two) times daily.        . cephALEXin (KEFLEX) 500 MG capsule Take 1 capsule (500 mg total) by mouth 2 (two) times daily.  10 capsule  0  . digoxin (LANOXIN) 0.125 MG tablet Take 1 tablet (0.125 mg total) by mouth daily.  90 tablet  3  . diltiazem (CARDIZEM) 60 MG tablet Take 1 tablet (60 mg total) by mouth 2 (two) times daily.  60 tablet  6  . doxazosin (CARDURA) 2 MG tablet Take 2 mg by mouth daily.        . metFORMIN (GLUCOPHAGE) 500 MG tablet Take 500 mg by mouth 2 (two) times daily with a meal.       . simvastatin (ZOCOR) 40 MG tablet Take 0.5 tablets (20 mg total) by mouth at bedtime.  30 tablet  6  . warfarin (COUMADIN) 2.5 MG tablet Take 2.5 mg by mouth daily. 2.5 mg on Monday and Fridays, and 5 mg on Tues, Wed. Thursday,  Saturday and Sunday       No current facility-administered medications for this visit.    Past Medical History  Diagnosis Date  . Hyperlipidemia   . Type II or unspecified type diabetes mellitus without mention of complication, not stated as uncontrolled     twice/week  . Chronic airway obstruction, not elsewhere classified     HFA 25-50% 07-16-2010> 100% 01-28-2011; PFT's 08-13-10 FEV1 .93(34%) ratio 37 with 23% better p B2 DLCO 69%  . Shortness of breath   . Hypertension   . Arthritis     in hands  . Dysrhythmia     Atrial Fibrillation; takes Cardizem  . Lung cancer 11/06/2013    Diagnosed during bronchoscopy  . Atrial fibrillation with RVR 02/18/2014    Past Surgical History  Procedure Laterality Date  . Video bronchoscopy Bilateral 08/21/2013     Procedure: VIDEO BRONCHOSCOPY WITH FLUORO;  Surgeon: Tanda Rockers, MD;  Location: [facility];  Service: Cardiopulmonary;  Laterality: Bilateral;  . 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 Cholecystectomy  1995  . Eye surgery Bilateral     cataracts  . Video bronchoscopy with endobronchial navigation N/A 10/31/2013    Procedure: VIDEO BRONCHOSCOPY WITH ENDOBRONCHIAL NAVIGATION;  Surgeon: Collene Gobble, MD;  Location: [place];  Service: Thoracic;  Laterality: N/A;  . Video bronchoscopy with endobronchial ultrasound N/A 10/31/2013    Procedure: VIDEO BRONCHOSCOPY WITH ENDOBRONCHIAL ULTRASOUND;  Surgeon: Collene Gobble, MD;  Location: [place];  Service: Thoracic;  Laterality: N/A;  . Transthoracic echocardiogram  10/29/2013    Mild-moderately reduced EF, 40-45%. Diffuse HK. Mild MR. Mild LA/RA dilation; no determination of elevated pulmonary arterial pressures     ZDG:LOVFIEP: pt's cough has improved and his SOB is better, no weight changes Skin:no rashes or ulcers HEENT:no blurred vision, no congestion CV:see HPI PUL:see HPI GI:no diarrhea constipation or melena, no indigestion, no problems swallowing, no GI bleeds or esophageal bleeds. GU:no hematuria, no dysuria MS:no joint pain, no claudication Neuro:no syncope, no lightheadedness Endo:+ diabetes, no thyroid disease  PHYSICAL EXAM BP 118/60  Ht 5\' 8"  (1.727 m)  Wt 164 lb (74.39 kg)  BMI 24.94 kg/m2 General:Pleasant affect, NAD Skin:Warm and dry, brisk capillary refill HEENT:normocephalic, sclera clear, mucus membranes moist Neck:supple, no JVD, no bruits  Heart:irreg irreg without murmur, gallup, rub or click Lungs:diminished throughout without rales, rhonchi, or wheezes PIR:JJOA, non tender, + BS, do not palpate liver spleen or masses Ext:no lower ext edema, 2+ pedal pulses, 2+ radial pulses Neuro:alert and oriented, MAE, follows commands, + facial symmetry  EKG:a fib rate controlled  with PVC no change from previous except now rate controlled.  ASSESSMENT AND PLAN Atrial fibrillation with controlled ventricular response Now with addition of Lanoxin rate controlled. Will schedule TEE/DCCV with Dr. Debara Pickett or Dr. Sallyanne Kuster.  INR is therapeutic today.  Pt agreeable to proceed.    Cough Improved.  Long term (current) use of anticoagulants On coumadin.  Therapeutic today.  COPD GOLD III Followed by Dr. Melvyn Novas

## 2014-03-04 NOTE — Patient Instructions (Signed)
We will schedule you a TEE cardioversion with Dr. Debara Pickett this week or next.  Do not eat after midnight the night before the test.

## 2014-03-04 NOTE — Assessment & Plan Note (Signed)
Improved

## 2014-03-04 NOTE — Progress Notes (Signed)
Please see previous note.

## 2014-03-11 ENCOUNTER — Telehealth: Payer: Self-pay | Admitting: Cardiology

## 2014-03-11 NOTE — Telephone Encounter (Signed)
Pt was suppose to have lab work today for his procedure on 4-6,His car broke down,wants to know if he can come tomorrow?

## 2014-03-11 NOTE — Telephone Encounter (Signed)
Returned call and pt verified x 2.  Pt informed message received and he can go to lab tomorrow at any time to have drawn.  Pt verbalized understanding and agreed w/ plan.

## 2014-03-12 LAB — COMPLETE METABOLIC PANEL WITH GFR
ALT: 10 U/L (ref 0–53)
AST: 10 U/L (ref 0–37)
Albumin: 3.8 g/dL (ref 3.5–5.2)
Alkaline Phosphatase: 79 U/L (ref 39–117)
BUN: 8 mg/dL (ref 6–23)
CALCIUM: 9.1 mg/dL (ref 8.4–10.5)
CHLORIDE: 101 meq/L (ref 96–112)
CO2: 31 mEq/L (ref 19–32)
Creat: 0.97 mg/dL (ref 0.50–1.35)
GFR, EST NON AFRICAN AMERICAN: 77 mL/min
GFR, Est African American: 89 mL/min
GLUCOSE: 169 mg/dL — AB (ref 70–99)
Potassium: 4.6 mEq/L (ref 3.5–5.3)
Sodium: 140 mEq/L (ref 135–145)
Total Bilirubin: 0.4 mg/dL (ref 0.2–1.2)
Total Protein: 6.1 g/dL (ref 6.0–8.3)

## 2014-03-12 LAB — CBC
HEMATOCRIT: 36.4 % — AB (ref 39.0–52.0)
HEMOGLOBIN: 12.3 g/dL — AB (ref 13.0–17.0)
MCH: 29.1 pg (ref 26.0–34.0)
MCHC: 33.8 g/dL (ref 30.0–36.0)
MCV: 86.1 fL (ref 78.0–100.0)
Platelets: 248 10*3/uL (ref 150–400)
RBC: 4.23 MIL/uL (ref 4.22–5.81)
RDW: 15.7 % — ABNORMAL HIGH (ref 11.5–15.5)
WBC: 7 10*3/uL (ref 4.0–10.5)

## 2014-03-12 LAB — PROTIME-INR
INR: 1.63 — ABNORMAL HIGH (ref <1.50)
PROTHROMBIN TIME: 19 s — AB (ref 11.6–15.2)

## 2014-03-12 LAB — TSH: TSH: 1.867 u[IU]/mL (ref 0.350–4.500)

## 2014-03-14 ENCOUNTER — Other Ambulatory Visit: Payer: Self-pay | Admitting: *Deleted

## 2014-03-14 DIAGNOSIS — G8918 Other acute postprocedural pain: Secondary | ICD-10-CM

## 2014-03-15 ENCOUNTER — Other Ambulatory Visit: Payer: Self-pay | Admitting: *Deleted

## 2014-03-18 ENCOUNTER — Encounter (HOSPITAL_COMMUNITY)
Admission: RE | Disposition: A | Discharge status: Home / Self Care | Payer: Medicare Other | Source: Ambulatory Visit | Attending: Internal Medicine

## 2014-03-18 ENCOUNTER — Encounter (HOSPITAL_COMMUNITY): Payer: Self-pay

## 2014-03-18 ENCOUNTER — Ambulatory Visit (HOSPITAL_COMMUNITY): Payer: Medicare Other | Admitting: Anesthesiology

## 2014-03-18 ENCOUNTER — Encounter (HOSPITAL_COMMUNITY): Payer: Medicare Other | Admitting: Anesthesiology

## 2014-03-18 ENCOUNTER — Ambulatory Visit (HOSPITAL_COMMUNITY)
Admission: RE | Admit: 2014-03-18 | Discharge: 2014-03-18 | Disposition: A | Discharge status: Home / Self Care | Payer: Medicare Other | Source: Ambulatory Visit | Attending: Internal Medicine | Admitting: Internal Medicine

## 2014-03-18 DIAGNOSIS — I429 Cardiomyopathy, unspecified: Secondary | ICD-10-CM

## 2014-03-18 DIAGNOSIS — J449 Chronic obstructive pulmonary disease, unspecified: Secondary | ICD-10-CM | POA: Insufficient documentation

## 2014-03-18 DIAGNOSIS — Z85118 Personal history of other malignant neoplasm of bronchus and lung: Secondary | ICD-10-CM | POA: Insufficient documentation

## 2014-03-18 DIAGNOSIS — E119 Type 2 diabetes mellitus without complications: Secondary | ICD-10-CM | POA: Insufficient documentation

## 2014-03-18 DIAGNOSIS — Z87891 Personal history of nicotine dependence: Secondary | ICD-10-CM | POA: Insufficient documentation

## 2014-03-18 DIAGNOSIS — J4489 Other specified chronic obstructive pulmonary disease: Secondary | ICD-10-CM | POA: Insufficient documentation

## 2014-03-18 DIAGNOSIS — I10 Essential (primary) hypertension: Secondary | ICD-10-CM | POA: Insufficient documentation

## 2014-03-18 DIAGNOSIS — I059 Rheumatic mitral valve disease, unspecified: Secondary | ICD-10-CM

## 2014-03-18 DIAGNOSIS — I4891 Unspecified atrial fibrillation: Secondary | ICD-10-CM | POA: Insufficient documentation

## 2014-03-18 HISTORY — PX: TEE WITHOUT CARDIOVERSION: SHX5443

## 2014-03-18 HISTORY — PX: CARDIOVERSION: SHX1299

## 2014-03-18 LAB — PROTIME-INR
INR: 1.69 — ABNORMAL HIGH (ref 0.00–1.49)
PROTHROMBIN TIME: 19.4 s — AB (ref 11.6–15.2)

## 2014-03-18 LAB — GLUCOSE, CAPILLARY: Glucose-Capillary: 135 mg/dL — ABNORMAL HIGH (ref 70–99)

## 2014-03-18 SURGERY — ECHOCARDIOGRAM, TRANSESOPHAGEAL
Anesthesia: General

## 2014-03-18 MED ORDER — ALBUTEROL SULFATE (2.5 MG/3ML) 0.083% IN NEBU
INHALATION_SOLUTION | RESPIRATORY_TRACT | Status: AC
  Filled 2014-03-18: qty 3

## 2014-03-18 MED ORDER — PROPOFOL INFUSION 10 MG/ML OPTIME
INTRAVENOUS | Status: DC | PRN
Start: 2014-03-18 — End: 2014-03-18
  Administered 2014-03-18: 100 ug/kg/min via INTRAVENOUS

## 2014-03-18 MED ORDER — WARFARIN SODIUM 5 MG PO TABS: 5.0000 mg | ORAL_TABLET | Freq: Every day | ORAL | Status: DC

## 2014-03-18 MED ORDER — PROPOFOL 10 MG/ML IV BOLUS
INTRAVENOUS | Status: DC | PRN
  Administered 2014-03-18: 40 mg via INTRAVENOUS

## 2014-03-18 MED ORDER — LIDOCAINE VISCOUS 2 % MT SOLN
OROMUCOSAL | Status: DC | PRN
  Administered 2014-03-18: 20 mL via OROMUCOSAL

## 2014-03-18 MED ORDER — LIDOCAINE VISCOUS 2 % MT SOLN
OROMUCOSAL | Status: AC
  Filled 2014-03-18: qty 15

## 2014-03-18 MED ORDER — SODIUM CHLORIDE 0.9 % IV SOLN
INTRAVENOUS | Status: DC | PRN
  Administered 2014-03-18: 14:00:00 via INTRAVENOUS

## 2014-03-18 MED ORDER — SODIUM CHLORIDE 0.9 % IV SOLN
INTRAVENOUS | Status: DC
  Administered 2014-03-18: 14:00:00 via INTRAVENOUS

## 2014-03-18 NOTE — CV Procedure (Signed)
TEE/CARDIOVERSION NOTE  TRANSESOPHAGEAL ECHOCARDIOGRAM (TEE):  Indictation: Atrial Fibrillation  Consent:   Informed consent was obtained prior to the procedure. The risks, benefits and alternatives for the procedure were discussed and the patient comprehended these risks.  Risks include, but are not limited to, cough, sore throat, vomiting, nausea, somnolence, esophageal and stomach trauma or perforation, bleeding, low blood pressure, aspiration, pneumonia, infection, trauma to the teeth and death.    Time Out: Verified patient identification, verified procedure, site/side was marked, verified correct patient position, special equipment/implants available, medications/allergies/relevent history reviewed, required imaging and test results available. Performed  Procedure:  After a procedural time-out, the oropharynx was anesthetized 10 cc of topical 1% viscous lidocaine.  Propofol was administered for sedation b the anesthesia staff. The transesophageal probe was inserted in the esophagus and stomach without difficulty and multiple views were obtained.  The patient was kept under observation until the patient left the procedure room.  The patient left the procedure room in stable condition.   Agitated microbubble saline contrast was not administered.  Complications:    Complications: None Patient did tolerate procedure well.  Findings:  1. LEFT VENTRICLE: The left ventricular wall thickness is mildly increased.  The left ventricular cavity is normal in size. Wall motion is globally hypokinetic.  LVEF is 45%.  2. RIGHT VENTRICLE:  The right ventricle is normal in structure and function without any thrombus or masses.    3. LEFT ATRIUM:  The left atrium is dilated in size without any thrombus or masses.  There is not spontaneous echo contrast ("smoke") in the left atrium consistent with a low flow state.  4. LEFT ATRIAL APPENDAGE:  The left atrial appendage is free of any thrombus  or masses. The appendage has single lobes. Pulse doppler indicates low flow in the appendage.  5. ATRIAL SEPTUM:  The atrial septum appears intact and is free of thrombus and/or masses.  There is no evidence for interatrial shunting by color doppler and saline microbubble.  6. RIGHT ATRIUM:  The right atrium is normal in size and function without any thrombus or masses.  7. MITRAL VALVE:  The mitral valve is normal in structure and function with trace to mild regurgitation.  There were no vegetations or stenosis.  8. AORTIC VALVE:  The aortic valve is normal in structure and function with no regurgitation.  There were no vegetations or stenosis  9. TRICUSPID VALVE:  The tricuspid valve is normal in structure and function with trivial regurgitation.  There were no vegetations or stenosis  10.  PULMONIC VALVE:  The pulmonic valve is normal in structure and function with no regurgitation.  There were no vegetations or stenosis.   11. AORTIC ARCH, ASCENDING AND DESCENDING AORTA:  There was grade 1 Ron Parker et. Al, 1992) atherosclerosis of the ascending aorta, aortic arch, or proximal descending aorta.  CARDIOVERSION:     Second Time Out: Verified patient identification, verified procedure, site/side was marked, verified correct patient position, special equipment/implants available, medications/allergies/relevent history reviewed, required imaging and test results available.  Performed  Procedure:  1. Patient placed on cardiac monitor, pulse oximetry, supplemental oxygen as necessary.  2. Sedation administered per anesthesia 3. Pacer pads placed anterior and posterior chest. 4. Cardioverted 2 time(s).  5. Cardioverted at 150J biphasic and 200J biphasic.  Complications:  Complications: None Patient did tolerate procedure well.  Impression:  1. Mild LVH. Dilated LA. 2. No LAA thrombus. 3.   Global hypokinesis - EF 45%. 4.  Successful DCCV at 200 J (second shock) to  NSR.  Recommendations:  1. Continue current mediations. 2. Close INR follow-up with Erasmo Downer, our anticoagulation pharmacist. 3.   Follow-up with Dr. Ellyn Hack or Cecilie Kicks, [place].  Time Spent Directly with the Patient:  30 minutes   [name], MD, [facility] Attending Cardiologist [facility]  03/18/2014, 2:24 PM

## 2014-03-18 NOTE — Anesthesia Preprocedure Evaluation (Addendum)
Anesthesia Evaluation  Patient identified by MRN, date of birth, ID band Patient awake    Reviewed: Allergy & Precautions, H&P , NPO status , Patient's Chart, lab work & pertinent test results  Airway Mallampati: II TM Distance: >3 FB     Dental   Pulmonary shortness of breath, pneumonia -, COPDformer smoker,  Lung ca + rhonchi         Cardiovascular hypertension, Pt. on medications + dysrhythmias Atrial Fibrillation Rhythm:Irregular Rate:Normal     Neuro/Psych    GI/Hepatic   Endo/Other  diabetes  Renal/GU      Musculoskeletal   Abdominal   Peds  Hematology   Anesthesia Other Findings   Reproductive/Obstetrics                          Anesthesia Physical Anesthesia Plan  ASA: III  Anesthesia Plan: General   Post-op Pain Management:    Induction: Intravenous  Airway Management Planned: Mask  Additional Equipment:   Intra-op Plan:   Post-operative Plan:   Informed Consent: I have reviewed the patients History and Physical, chart, labs and discussed the procedure including the risks, benefits and alternatives for the proposed anesthesia with the patient or authorized representative who has indicated his/her understanding and acceptance.   Dental advisory given  Plan Discussed with: CRNA and Surgeon  Anesthesia Plan Comments:        Anesthesia Quick Evaluation

## 2014-03-18 NOTE — Transfer of Care (Signed)
Immediate Anesthesia Transfer of Care Note  Patient: Brent Wade  Procedure(s) Performed: Procedure(s): TRANSESOPHAGEAL ECHOCARDIOGRAM (TEE) (N/A) CARDIOVERSION (N/A)  Patient Location: Endoscopy Unit  Anesthesia Type:MAC  Level of Consciousness: awake, alert  and oriented  Airway & Oxygen Therapy: Patient Spontanous Breathing and Patient connected to nasal cannula oxygen  Post-op Assessment: Report given to PACU RN and Post -op Vital signs reviewed and stable  Post vital signs: Reviewed and stable  Complications: No apparent anesthesia complications

## 2014-03-18 NOTE — Progress Notes (Signed)
*  PRELIMINARY RESULTS* Echocardiogram Echocardiogram Transesophageal has been performed.  [name] 03/18/2014, 2:33 PM

## 2014-03-18 NOTE — Discharge Instructions (Addendum)
Important!!!  Take 10 mg warfarin tonight, then 5 mg daily thereafter until contacted by [name]).    Electrical Cardioversion Electrical cardioversion is the delivery of a jolt of electricity to change the rhythm of the heart. Sticky patches or metal paddles are placed on the chest to deliver the electricity from a device. This is done to restore a normal rhythm. A rhythm that is too fast or not regular keeps the heart from pumping well. Electrical cardioversion is done in an emergency if:   There is low or no blood pressure as a result of the heart rhythm.   Normal rhythm must be restored as fast as possible to protect the brain and heart from further damage.   It may save a life. Cardioversion may be done for heart rhythms that are not immediately life-threatening, such as atrial fibrillation or flutter, in which:   The heart is beating too fast or is not regular.   Medicine to change the rhythm has not worked.   It is safe to wait in order to allow time for preparation.  Symptoms of the abnormal rhythm are bothersome.  The risk of stroke and other serious complications can be reduced. LET YOUR CAREGIVER KNOW ABOUT:   All medicines you are taking, including vitamins, herbs, eye drops, creams, and over-the-counter medicines.   Previous problems you or members of your family have had with the use of anesthetics.   Any blood disorders you have.   Previous surgeries you have had.   Medical conditions you have. RISKS AND COMPLICATIONS  Generally, this is a safe procedure. However, as with any procedure, complications can occur. Possible complications include:   Breathing problems related to the anesthetic used.  Cardiac arrest This risk is rare.  A blood clot that breaks free and travels to other parts of your body. This could cause a stroke or other problems. The risk of this is lowered by use of blood thinning medicine  (anticoagulant) prior to the procedure. BEFORE THE PROCEDURE   You may have tests to detect blood clots in your heart and evaluate heart function.  You may start taking anticoagulants so your blood does not clot as easily.   Medicines may be given to help stabilize your heart rate and rhythm. PROCEDURE  You will be given medicine through an IV tube to reduce discomfort and make you sleepy (sedative).   An electrical shock will be delivered. AFTER THE PROCEDURE Your heart rhythm will be watched to make sure it does not change.You may be able to go home within a few hours.  Document Released: 11/19/2002 Document Revised: 09/19/2013 Document Reviewed: 06/13/2013 [facility] Patient Information 2014 [place]. Transesophageal Echocardiography Transesophageal echocardiography (TEE) is a special type of test that produces images of the heart by using sound waves (echocardiogram). This type of echocardiography can obtain better images of the heart than standard echocardiography. TEE is done by passing a flexible tube down the esophagus. The heart is located in front of the esophagus. Because the heart and esophagus are close to one another, your health care provider can take very clear, detailed pictures of the heart via ultrasound waves. TEE may be done:  If your health care provider needs more information based on standard echocardiography findings.  If you had a stroke. This might have happened because a clot formed in your heart. TEE can visualize different areas of the heart and check for clots.  To check valve anatomy and function.  To check  for infection on the inside of your heart (endocarditis).  To evaluate the dividing wall (septum) of the heart and presence of a hole that did not close after birth (patent foramen ovale or atrial septal defect).  To help diagnose a tear in the wall of the aorta (aortic dissection).  During cardiac valve surgery. This allows the surgeon to  assess the valve repair before closing the chest.  During a variety of other cardiac procedures to guide positioning of catheters.  Sometimes before a cardioversion, which is a shock to convert heart rhythm back to normal. LET [facility] CARE PROVIDER KNOW ABOUT:   Any allergies you have.  All medicines you are taking, including vitamins, herbs, eye drops, creams, and over-the-counter medicines.  Previous problems you or members of your family have had with the use of anesthetics.  Any blood disorders you have.  Previous surgeries you have had.  Medical conditions you have.  Swallowing difficulties.  An esophageal obstruction. RISKS AND COMPLICATIONS  Generally, TEE is a safe procedure. However, as with any procedure, complications can occur. Possible complications include an esophageal tear (rupture). BEFORE THE PROCEDURE   Do not eat or drink for 6 hours before the procedure or as directed by your health care provider.  Arrange for someone to drive you home after the procedure. Do not drive yourself home. During the procedure, you will be given medicines that can continue to make you feel drowsy and can impair your reflexes.  An IV access tube will be started in the arm. PROCEDURE   A medicine to help you relax (sedative) will be given through the IV access tube.  A medicine that numbs the area (local anesthetic) may be sprayed in the back of the throat.  Your blood pressure, heart rate, and breathing (vital signs) will be monitored during the procedure.  The TEE probe is a long, flexible tube. The tip of the probe is placed into the back of the mouth, and you will be asked to swallow. This helps to pass the tip of the probe into the esophagus. Once the tip of the probe is in the correct area, your health care provider can take pictures of the heart.  TEE is usually not a painful procedure. You may feel the probe press against the back of the throat. The probe does not  enter the trachea and does not affect your breathing.  Your time spent at the hospital is usually less than 2 hours. AFTER THE PROCEDURE   You will be in bed, resting, until you have fully returned to consciousness.  When you first awaken, your throat may feel slightly sore and will probably still feel numb. This will improve slowly over time.  You will not be allowed to eat or drink until it is clear that the numbness has improved.  Once you have been able to drink, urinate, and sit on the edge of the bed without feeling sick to your stomach (nausea) or dizzy, you may be cleared to go home.  You should have a friend or family member with you for the next 24 hours after your procedure. Document Released: 02/19/2003 Document Revised: 09/19/2013 Document Reviewed: 05/31/2013 [facility] Patient Information 2014 [place], [place]. Electrical Cardioversion, Care After Refer to this sheet in the next few weeks. These instructions provide you with information on caring for yourself after your procedure. Your health care provider may also give you more specific instructions. Your treatment has been planned according to current medical practices, but  problems sometimes occur. Call your health care provider if you have any problems or questions after your procedure. WHAT TO EXPECT AFTER THE PROCEDURE After your procedure, it is typical to have the following sensations:  Some redness on the skin where the shocks were delivered. If this is tender, a sunburn lotion or hydrocortisone cream may help.  Possible return of an abnormal heart rhythm within hours or days after the procedure. HOME CARE INSTRUCTIONS  Only take medicine as directed by your health care provider. Be sure you understand how and when to take your medicine.  Learn how to feel your pulse and check it often.  Limit your activity for 48 hours after the procedure or as directed.  Avoid or minimize caffeine and other stimulants as  directed. SEEK MEDICAL CARE IF:  You feel like your heart is beating too fast or your pulse is not regular.  You have any questions about your medicines.  You have bleeding that will not stop. SEEK IMMEDIATE MEDICAL CARE IF:  You are dizzy or feel faint.  It is hard to breathe or you feel short of breath.  There is a change in discomfort in your chest.  Your speech is slurred or you have trouble moving an arm or leg on one side of your body.  You get a serious muscle cramp that does not go away.  Your fingers or toes turn cold or blue. MAKE SURE YOU:   Understand these instructions.   Will watch your condition.   Will get help right away if you are not doing well or get worse. Document Released: 09/19/2013 Document Reviewed: 06/13/2013 [facility] Patient Information 2014 [place], [place].

## 2014-03-18 NOTE — Anesthesia Postprocedure Evaluation (Signed)
  Anesthesia Post-op Note  Patient: Brent Wade  Procedure(s) Performed: Procedure(s): TRANSESOPHAGEAL ECHOCARDIOGRAM (TEE) (N/A) CARDIOVERSION (N/A)  Patient Location: PACU and Endoscopy Unit  Anesthesia Type:General  Level of Consciousness: awake  Airway and Oxygen Therapy: Patient Spontanous Breathing  Post-op Pain: none  Post-op Assessment: Post-op Vital signs reviewed, Patient's Cardiovascular Status Stable, Respiratory Function Stable, Patent Airway, No signs of Nausea or vomiting and Pain level controlled  Post-op Vital Signs: Reviewed and stable  Complications: No apparent anesthesia complications

## 2014-03-18 NOTE — H&P (Signed)
     INTERVAL PROCEDURE H&P  History and Physical Interval Note:  03/18/2014 1:48 PM  Brent Wade has presented today for their planned procedure. The various methods of treatment have been discussed with the patient and family. After consideration of risks, benefits and other options for treatment, the patient has consented to the procedure.  The patients' outpatient history has been reviewed, patient examined, and no change in status from most recent office note within the past 30 days. I have reviewed the patients' chart and labs and will proceed as planned. Questions were answered to the patient's satisfaction.   Brent Casino, MD, [facility] Attending Cardiologist [facility]  Brent Wade C 03/18/2014, 1:48 PM

## 2014-03-19 ENCOUNTER — Encounter (HOSPITAL_COMMUNITY): Payer: Self-pay | Admitting: Internal Medicine

## 2014-03-22 ENCOUNTER — Ambulatory Visit (INDEPENDENT_AMBULATORY_CARE_PROVIDER_SITE_OTHER): Payer: Medicare Other | Admitting: Pharmacist Clinician (PhC)/ Clinical Pharmacy Specialist

## 2014-03-22 DIAGNOSIS — Z7901 Long term (current) use of anticoagulants: Secondary | ICD-10-CM

## 2014-03-22 DIAGNOSIS — I4891 Unspecified atrial fibrillation: Secondary | ICD-10-CM

## 2014-03-22 LAB — POCT INR: INR: 1.8

## 2014-03-29 ENCOUNTER — Other Ambulatory Visit: Payer: Self-pay | Admitting: Pharmacist Clinician (PhC)/ Clinical Pharmacy Specialist

## 2014-03-29 MED ORDER — WARFARIN SODIUM 5 MG PO TABS: ORAL_TABLET | ORAL | Status: DC

## 2014-04-02 ENCOUNTER — Ambulatory Visit: Payer: Medicare Other | Admitting: Pharmacist Clinician (PhC)/ Clinical Pharmacy Specialist

## 2014-04-08 ENCOUNTER — Ambulatory Visit (INDEPENDENT_AMBULATORY_CARE_PROVIDER_SITE_OTHER): Payer: Medicare Other | Admitting: Pharmacist Clinician (PhC)/ Clinical Pharmacy Specialist

## 2014-04-08 DIAGNOSIS — Z7901 Long term (current) use of anticoagulants: Secondary | ICD-10-CM

## 2014-04-08 DIAGNOSIS — I4891 Unspecified atrial fibrillation: Secondary | ICD-10-CM

## 2014-04-08 LAB — POCT INR: INR: 3.6

## 2014-04-25 ENCOUNTER — Encounter: Payer: Self-pay | Admitting: Cardiology

## 2014-04-25 ENCOUNTER — Ambulatory Visit (INDEPENDENT_AMBULATORY_CARE_PROVIDER_SITE_OTHER): Payer: Medicare Other | Admitting: Cardiology

## 2014-04-25 ENCOUNTER — Ambulatory Visit (INDEPENDENT_AMBULATORY_CARE_PROVIDER_SITE_OTHER): Payer: Medicare Other | Admitting: *Deleted

## 2014-04-25 VITALS — BP 106/60 | HR 70 | Ht 68.0 in | Wt 164.9 lb

## 2014-04-25 DIAGNOSIS — I480 Paroxysmal atrial fibrillation: Secondary | ICD-10-CM

## 2014-04-25 DIAGNOSIS — I4891 Unspecified atrial fibrillation: Secondary | ICD-10-CM

## 2014-04-25 DIAGNOSIS — E785 Hyperlipidemia, unspecified: Secondary | ICD-10-CM

## 2014-04-25 DIAGNOSIS — I429 Cardiomyopathy, unspecified: Secondary | ICD-10-CM

## 2014-04-25 DIAGNOSIS — I10 Essential (primary) hypertension: Secondary | ICD-10-CM

## 2014-04-25 DIAGNOSIS — Z7901 Long term (current) use of anticoagulants: Secondary | ICD-10-CM

## 2014-04-25 LAB — POCT INR: INR: 2.1

## 2014-04-25 NOTE — Patient Instructions (Signed)
continue current  Medication Your physician wants you to follow-up in 6 month Dr Ellyn Hack.  You will receive a reminder letter in the mail two months in advance. If you don't receive a letter, please call our office to schedule the follow-up appointment.

## 2014-04-27 ENCOUNTER — Encounter: Payer: Self-pay | Admitting: Cardiology

## 2014-04-27 DIAGNOSIS — I480 Paroxysmal atrial fibrillation: Secondary | ICD-10-CM | POA: Insufficient documentation

## 2014-04-27 NOTE — Assessment & Plan Note (Addendum)
Controlled  No change

## 2014-04-27 NOTE — Assessment & Plan Note (Signed)
Stable s/p DCCV. Continue current RX.

## 2014-04-27 NOTE — Assessment & Plan Note (Addendum)
Based upon a negative Myoview -- most likely Non-ischemic.  No angina.  EF looked better on TEE (45-50%). Not using BB due to lung disease - Diltiazem for rate control (EF not too low). Digoxin for rate control & inotropic effect. BP not high enough to consider ACE-I / ARB.Brent Wade

## 2014-04-27 NOTE — Assessment & Plan Note (Signed)
ON Statin -- PCP

## 2014-04-27 NOTE — Progress Notes (Signed)
PCP: Simona Huh, MD  Clinic Note: Chief Complaint  Patient presents with  . Annual Exam    NO CHEST PAIN , SOB -COPD, NO EDEMA----FEB 2015 LABS DONE AT PCP PER PATIENT   HPI: Brent Wade is a 73 y.o. male with a PMH below who presents today for combination 6 month f/u & f/u s/p DCCV. He was seen by Cecilie Kicks, NP-C on 3/9 & 3/23 for recurrence of Afib with RVR noted during a Bronchoscopy procedure byDr. Melvyn Novas as part of his ongoing Rx for Lung CA. With the onset of Afi b - RVR, he noted dyspnea. He was loaded with Digoxin & scheduled for TEE-DCCV on 4/6 when he was still noted in Afib-RV (controlled after Digoxin, TEE b/c INR subtherapeutic). He was successfully cardioverted after 2 shocks.   Interval History: He actually feels much better now following the DCCV.  He seems to be maintaining NSR. He has baseline exertional dyspnea from lung disease, but denies CP with rest or exertion.  He has not noted any further episodes of fluttering in his chest (but is not very symptomatic with Afib).  He also denies any recurrence of whatever "bug" he had last month.  No F/C with improved cough.   No chest pain / pressure with rest or exertion.  No PND, orthopnea or edema.  No palpitations, lightheadedness, dizziness, weakness or syncope/near syncope. No TIA/amaurosis fugax symptoms. No melena, hematochezia, hematuria, or epstaxis. No claudication.  Past Medical History  Diagnosis Date  . Hyperlipidemia   . Type II or unspecified type diabetes mellitus without mention of complication, not stated as uncontrolled     twice/week  . Chronic airway obstruction, not elsewhere classified     HFA 25-50% 07-16-2010> 100% 01-28-2011; PFT's 08-13-10 FEV1 .93(34%) ratio 37 with 23% better p B2 DLCO 69%  . Shortness of breath   . Hypertension   . Arthritis     in hands  . PAF (paroxysmal atrial fibrillation) Nov 2014    Atrial Fibrillation; takes Cardizem, Digoxi, on Warfarin;; TEE DCCV 03/18/2014     . Lung cancer 11/06/2013    Diagnosed during bronchoscopy    Past Cardiovascular Procedures/ Surgeries   Procedure Laterality Date  . Transthoracic echocardiogram  10/29/2013    Mild-moderately reduced EF, 40-45%. Diffuse HK. Mild MR. Mild LA/RA dilation; no determination of elevated pulmonary arterial pressures   . Tee without cardioversion N/A 03/18/2014    Procedure: TRANSESOPHAGEAL ECHOCARDIOGRAM (TEE);  Surgeon: [name], MD;  Location: [facility] ENDOSCOPY;  Service: Cardiovascular;  Laterality: N/A; -- Mild Conc LVH, EF 45-50%, dilated LA no LAA thrombus; normal valves  . Cardioversion N/A 03/18/2014    Procedure: SUCCESSFUL CARDIOVERSION;  Surgeon: [name], MD;  Location: [facility] ENDOSCOPY;  Service: Cardiovascular;  Laterality: N/A;  . Nm myoview ltd  11/2014    No ischemia or infarction    MEDICATIONS AND ALLERGIES REVIEWED IN EPIC No Change in Social and Family History  ROS: A comprehensive Review of Systems - Negative except mild residual cough (chronic); otherwise negative  PHYSICAL EXAM BP 106/60  Pulse 70  Ht 5\' 8"  (1.727 m)  Wt 164 lb 14.4 oz (74.798 kg)  BMI 25.08 kg/m2 General appearance: Alert and oriented X 3, cooperative, appears stated age, no distress and pleasant mood and affect. Well-nourished and well-groomed. Answers questions appropriately.  HEENT: [place]/AT, EOMI, MMM, anicteric sclera; mild arcus sinilus  Neck: no adenopathy, no carotid bruit, no JVD, supple, symmetrical, trachea midline and thyroid not  enlarged, symmetric, no tenderness/mass/nodules  Lungs: Mostly CTA B. with mild end expiratory wheezing bilaterally; nonlabored. No rales or rhonchi.  Heart: normal apical impulse and irregularly irregular rate and rhythm. Tachycardic with no obvious rubs or gallops. There is a soft 1/6 SEM at RUSB.  Abdomen: soft, non-tender; bowel sounds normal; no masses, no organomegaly  Extremities: No clubbing, cyanosis with trace lower extremity edema; Pulses: 2+ and  symmetric    Adult ECG Report  Rate: 70 ;  Rhythm: normal sinus rhythm; normal axis, intervals & voltage.   Narrative Interpretation: Normal ECG with mild digoxin effect.  Recent Labs from 02/2014 reviewed in [place].  Unremarkable    ASSESSMENT / PLAN: PAF (paroxysmal atrial fibrillation) Stable s/p DCCV. Continue current RX.   Essential hypertension Controlled.  No change  HYPERLIPIDEMIA ON Statin -- PCP  Non-ischemic cardiomyopathy - mild-moderate (EF ~45%), Negative Myoview. Based upon a negative Myoview -- most likely Non-ischemic.  No angina.  EF looked better on TEE (45-50%). Not using BB due to lung disease - Diltiazem for rate control (EF not too low). Digoxin for rate control & inotropic effect. BP not high enough to consider ACE-I / ARB..    Orders Placed This Encounter  Procedures  . EKG 12-Lead   No orders of the defined types were placed in this encounter.    Followup: 6 months  Quindell Shere W. Ellyn Hack, M.D., M.S. Interventional Cardiologist [facility]

## 2014-05-14 ENCOUNTER — Encounter (HOSPITAL_COMMUNITY): Payer: Self-pay

## 2014-05-17 ENCOUNTER — Ambulatory Visit (INDEPENDENT_AMBULATORY_CARE_PROVIDER_SITE_OTHER): Payer: Medicare Other | Admitting: Pharmacist Clinician (PhC)/ Clinical Pharmacy Specialist

## 2014-05-17 DIAGNOSIS — I4891 Unspecified atrial fibrillation: Secondary | ICD-10-CM

## 2014-05-17 DIAGNOSIS — Z7901 Long term (current) use of anticoagulants: Secondary | ICD-10-CM

## 2014-05-17 LAB — POCT INR: INR: 1.8

## 2014-06-07 ENCOUNTER — Ambulatory Visit (INDEPENDENT_AMBULATORY_CARE_PROVIDER_SITE_OTHER): Payer: Medicare Other | Admitting: Pharmacist Clinician (PhC)/ Clinical Pharmacy Specialist

## 2014-06-07 DIAGNOSIS — I4891 Unspecified atrial fibrillation: Secondary | ICD-10-CM

## 2014-06-07 DIAGNOSIS — Z7901 Long term (current) use of anticoagulants: Secondary | ICD-10-CM

## 2014-06-07 LAB — POCT INR: INR: 2.1

## 2014-07-08 ENCOUNTER — Other Ambulatory Visit: Payer: Self-pay | Admitting: Cardiology

## 2014-07-09 NOTE — Telephone Encounter (Signed)
Rx was sent to pharmacy electronically. 

## 2014-07-12 ENCOUNTER — Ambulatory Visit (INDEPENDENT_AMBULATORY_CARE_PROVIDER_SITE_OTHER): Payer: Medicare Other | Admitting: Pharmacist Clinician (PhC)/ Clinical Pharmacy Specialist

## 2014-07-12 DIAGNOSIS — I4891 Unspecified atrial fibrillation: Secondary | ICD-10-CM

## 2014-07-12 DIAGNOSIS — Z7901 Long term (current) use of anticoagulants: Secondary | ICD-10-CM

## 2014-07-12 LAB — POCT INR: INR: 2.1

## 2014-08-23 ENCOUNTER — Ambulatory Visit (INDEPENDENT_AMBULATORY_CARE_PROVIDER_SITE_OTHER): Payer: Medicare Other | Admitting: Pharmacist Clinician (PhC)/ Clinical Pharmacy Specialist

## 2014-08-23 DIAGNOSIS — Z7901 Long term (current) use of anticoagulants: Secondary | ICD-10-CM

## 2014-08-23 DIAGNOSIS — I4891 Unspecified atrial fibrillation: Secondary | ICD-10-CM

## 2014-08-23 LAB — POCT INR: INR: 2.7

## 2014-09-03 ENCOUNTER — Other Ambulatory Visit: Payer: Self-pay | Admitting: Radiation Oncology

## 2014-09-03 DIAGNOSIS — C3491 Malignant neoplasm of unspecified part of right bronchus or lung: Secondary | ICD-10-CM

## 2014-09-04 ENCOUNTER — Telehealth: Payer: Self-pay | Admitting: *Deleted

## 2014-09-04 NOTE — Telephone Encounter (Signed)
CALLED PATIENT TO INFORM OF CT FOR 09-09-14- ARRIVAL TIME - 7:45 AM @ [facility] AND HIS FU WITH DR. Pablo Ledger ON 09-13-14 @ 3:40 PM, SPOKE WITH PATIENT AND HE IS AWARE OF THIESE  APPTS.

## 2014-09-05 ENCOUNTER — Ambulatory Visit: Payer: Self-pay | Admitting: Radiation Oncology

## 2014-09-05 ENCOUNTER — Telehealth: Payer: Self-pay | Admitting: *Deleted

## 2014-09-05 NOTE — Telephone Encounter (Signed)
FAXED CLEARANCE FOR COLONOSCOPY 09/23/14- DR MAGOD STOP COUMADIN  5-7 DAYS PRE PROCEDURE RESTART 2 DAYS POST-OP

## 2014-09-09 ENCOUNTER — Ambulatory Visit (HOSPITAL_COMMUNITY)
Admission: RE | Admit: 2014-09-09 | Discharge: 2014-09-09 | Disposition: A | Discharge status: Home / Self Care | Payer: Medicare Other | Source: Ambulatory Visit | Attending: Radiation Oncology | Admitting: Radiation Oncology

## 2014-09-09 DIAGNOSIS — C3491 Malignant neoplasm of unspecified part of right bronchus or lung: Secondary | ICD-10-CM

## 2014-09-09 DIAGNOSIS — R0602 Shortness of breath: Secondary | ICD-10-CM | POA: Insufficient documentation

## 2014-09-09 DIAGNOSIS — C3490 Malignant neoplasm of unspecified part of unspecified bronchus or lung: Secondary | ICD-10-CM | POA: Diagnosis not present

## 2014-09-09 DIAGNOSIS — R599 Enlarged lymph nodes, unspecified: Secondary | ICD-10-CM | POA: Diagnosis not present

## 2014-09-13 ENCOUNTER — Ambulatory Visit: Payer: Medicare Other | Admitting: Radiation Oncology

## 2014-09-13 ENCOUNTER — Telehealth: Payer: Self-pay | Admitting: *Deleted

## 2014-09-13 ENCOUNTER — Other Ambulatory Visit: Payer: Self-pay | Admitting: Radiation Oncology

## 2014-09-13 DIAGNOSIS — C3491 Malignant neoplasm of unspecified part of right bronchus or lung: Secondary | ICD-10-CM

## 2014-09-13 NOTE — Telephone Encounter (Signed)
Called patient to ask about not coming today due to the weather, patient rejected this and stated that he wants to come today, notified Dr. Pablo Ledger

## 2014-09-13 NOTE — Telephone Encounter (Signed)
CALLED PATIENT TO ASK ABOUT HIM CONTACTING HIS PCP TO ARRANGE AN APPT. FOR TODAY REGARDING PAIN, PATIENT AGREED TO DO SO , IF HE CAN'T ACCOMPLISH THAT HE WILL CALL ME AND LET ME KNOW SO HE CAN COME IN TODAY, INFORMED PATIENT OF PET AND FU, SPOKE WITH PATIENT AND HE IS AWARE OF THESE APPTS.

## 2014-09-20 ENCOUNTER — Ambulatory Visit (HOSPITAL_COMMUNITY)
Admission: RE | Admit: 2014-09-20 | Discharge: 2014-09-20 | Disposition: A | Discharge status: Home / Self Care | Payer: Medicare Other | Source: Ambulatory Visit | Attending: Radiation Oncology | Admitting: Radiation Oncology

## 2014-09-20 ENCOUNTER — Encounter (HOSPITAL_COMMUNITY): Payer: Self-pay

## 2014-09-20 ENCOUNTER — Ambulatory Visit
Admission: RE | Admit: 2014-09-20 | Discharge: 2014-09-20 | Disposition: A | Discharge status: Home / Self Care | Payer: Medicare Other | Source: Ambulatory Visit | Attending: Radiation Oncology | Admitting: Radiation Oncology

## 2014-09-20 VITALS — BP 132/67 | HR 99 | Temp 97.5°F | Wt 165.9 lb

## 2014-09-20 DIAGNOSIS — C3491 Malignant neoplasm of unspecified part of right bronchus or lung: Secondary | ICD-10-CM | POA: Diagnosis not present

## 2014-09-20 LAB — GLUCOSE, CAPILLARY: GLUCOSE-CAPILLARY: 163 mg/dL — AB (ref 70–99)

## 2014-09-20 MED ORDER — OXYCODONE-ACETAMINOPHEN 5-325 MG PO TABS
1.0000 | ORAL_TABLET | ORAL | Status: DC | PRN
Start: 2014-09-20 — End: 2014-10-18

## 2014-09-20 MED ORDER — FLUDEOXYGLUCOSE F - 18 (FDG) INJECTION
8.2000 | Freq: Once | INTRAVENOUS | Status: AC | PRN
  Administered 2014-09-20: 8.2 via INTRAVENOUS

## 2014-09-20 NOTE — Progress Notes (Signed)
Follow up right upper lobe lung to review PET report.Patient started on cephalexin and hydrocodone by Dr.Griffin on 09/13/14 with 1 week onset of bilteral rib cage pain.Scheduled for colonoscopy 09/23/14 by Dr.Magod.Hypermetabolic right hilar and mediastinal lymph nodes.

## 2014-09-23 NOTE — Progress Notes (Signed)
   Department of Radiation Oncology  Phone:  [phone] Fax:        [phone]   Name: Brent Wade MRN: [id number]  DOB: 04-27-1941  Date: 09/20/2014  Follow Up Visit Note  Diagnosis:    ICD-9-CM ICD-10-CM  1. 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   He was hospitalized after his last treatment and therefore missed his follow up appointment. He called our office earlier this week complaining of shortness of breath and cough. He had taken an old pain medication and that made him feel better. He denied hemoptysis, headache, or balance issues. His shortness of breath is much worse than when he came into the hospital. He saw his PCP who have him percocet and an anitbiotic. He is feeling better today. He came in to discuss the results of his PET scan which he had earlier today and showed hypermetabolic adenopathy in the precarinal, subcarinal and right hilar areas consistent with what we saw on his most recent PET. The treated areas are intact with no evidence of progression.    Physical Exam:  Filed Vitals:   09/20/14 1457  BP: 132/67  Pulse: 99  Temp: 97.5 F (36.4 C)  Weight: 165 lb 14.4 oz (75.252 kg)  SpO2: 98%   IMPRESSION: Brent Wade is a 73 y.o. male s/p radiation to 2 lesions in the right lung with progressive hilar/mediastinal adenopathy.   PLAN:  We discussed his scan results and his breathing. We discussed a biopsy to prove that this is cancer which he declined which is probably wise. At this point and at his present condition, I do not believe he would tolerate definitive and aggressive local chemotherapy and radiation to the mediastinum and hilum.  In addition, we discussed that aggressive chemo and radiation only  has a 10-15% chance of curing him and can make his breathing worse. We also discussed hospice referral which he is interested in. He would like to talk over this decision with his family and I asked him to call his PCP to set up hospice at home  I will be available for follow up on an as needed basis. i gave him a copy of his PET scan report.    Thea Silversmith, MD

## 2014-10-04 ENCOUNTER — Ambulatory Visit (INDEPENDENT_AMBULATORY_CARE_PROVIDER_SITE_OTHER): Payer: Medicare Other | Admitting: Pharmacist Clinician (PhC)/ Clinical Pharmacy Specialist

## 2014-10-04 DIAGNOSIS — Z7901 Long term (current) use of anticoagulants: Secondary | ICD-10-CM

## 2014-10-04 DIAGNOSIS — I4891 Unspecified atrial fibrillation: Secondary | ICD-10-CM

## 2014-10-04 LAB — POCT INR: INR: 1.1

## 2014-10-18 ENCOUNTER — Other Ambulatory Visit: Payer: Self-pay | Admitting: Radiation Oncology

## 2014-10-18 MED ORDER — OXYCODONE-ACETAMINOPHEN 5-325 MG PO TABS
1.0000 | ORAL_TABLET | ORAL | Status: DC | PRN
Start: 2014-10-18 — End: 2014-11-25

## 2014-10-21 ENCOUNTER — Ambulatory Visit (INDEPENDENT_AMBULATORY_CARE_PROVIDER_SITE_OTHER): Payer: Medicare Other | Admitting: Pharmacist Clinician (PhC)/ Clinical Pharmacy Specialist

## 2014-10-21 DIAGNOSIS — Z7901 Long term (current) use of anticoagulants: Secondary | ICD-10-CM

## 2014-10-21 DIAGNOSIS — I4891 Unspecified atrial fibrillation: Secondary | ICD-10-CM

## 2014-10-21 LAB — POCT INR: INR: 2.3

## 2014-11-04 ENCOUNTER — Other Ambulatory Visit: Payer: Self-pay | Admitting: Pharmacist Clinician (PhC)/ Clinical Pharmacy Specialist

## 2014-11-04 MED ORDER — WARFARIN SODIUM 5 MG PO TABS: ORAL_TABLET | ORAL | Status: DC

## 2014-11-22 ENCOUNTER — Telehealth: Payer: Self-pay | Admitting: Internal Medicine

## 2014-11-22 MED ORDER — PREDNISONE 10 MG PO TABS: ORAL_TABLET | ORAL | Status: DC

## 2014-11-22 MED ORDER — AZITHROMYCIN 250 MG PO TABS: ORAL_TABLET | ORAL | Status: DC

## 2014-11-22 NOTE — Telephone Encounter (Signed)
Called and spoke to pt. Pt c/o prod cough with clear and light yellow mucus, chest congestion, audible wheezing and increase in SOB x 3 days. Pt denies CP/tightness and f/c/s and is not taking any medication OTC. Pt last seen by MW on 02/12/14.  Dr. Melvyn Novas please advise.  No Known Allergies

## 2014-11-22 NOTE — Telephone Encounter (Signed)
Pt aware of recs.  meds sent in.  Nothing further needed.

## 2014-11-22 NOTE — Telephone Encounter (Signed)
zpak Prednisone 10 mg take  4 each am x 2 days,   2 each am x 2 days,  1 each am x 2 days and stop  F/u next week if not better

## 2014-11-25 ENCOUNTER — Other Ambulatory Visit: Payer: Self-pay | Admitting: Radiation Oncology

## 2014-11-25 MED ORDER — OXYCODONE-ACETAMINOPHEN 5-325 MG PO TABS
1.0000 | ORAL_TABLET | ORAL | Status: DC | PRN
Start: 2014-11-25 — End: 2014-12-31

## 2014-12-02 ENCOUNTER — Ambulatory Visit (INDEPENDENT_AMBULATORY_CARE_PROVIDER_SITE_OTHER): Payer: Medicare Other | Admitting: Pharmacist Clinician (PhC)/ Clinical Pharmacy Specialist

## 2014-12-02 DIAGNOSIS — I4891 Unspecified atrial fibrillation: Secondary | ICD-10-CM

## 2014-12-02 DIAGNOSIS — Z7901 Long term (current) use of anticoagulants: Secondary | ICD-10-CM

## 2014-12-02 LAB — POCT INR: INR: 2.5

## 2014-12-31 ENCOUNTER — Other Ambulatory Visit: Payer: Self-pay | Admitting: Radiation Oncology

## 2014-12-31 MED ORDER — OXYCODONE-ACETAMINOPHEN 5-325 MG PO TABS
1.0000 | ORAL_TABLET | ORAL | Status: DC | PRN
Start: 2014-12-31 — End: 2015-02-12

## 2015-01-01 ENCOUNTER — Ambulatory Visit (INDEPENDENT_AMBULATORY_CARE_PROVIDER_SITE_OTHER): Payer: Medicare Other | Admitting: Cardiology

## 2015-01-01 ENCOUNTER — Ambulatory Visit (INDEPENDENT_AMBULATORY_CARE_PROVIDER_SITE_OTHER): Payer: Medicare Other | Admitting: Pharmacist Clinician (PhC)/ Clinical Pharmacy Specialist

## 2015-01-01 ENCOUNTER — Encounter: Payer: Self-pay | Admitting: Cardiology

## 2015-01-01 VITALS — BP 110/60 | HR 73 | Ht 68.0 in | Wt 166.6 lb

## 2015-01-01 DIAGNOSIS — Z7901 Long term (current) use of anticoagulants: Secondary | ICD-10-CM

## 2015-01-01 DIAGNOSIS — R0609 Other forms of dyspnea: Secondary | ICD-10-CM

## 2015-01-01 DIAGNOSIS — E785 Hyperlipidemia, unspecified: Secondary | ICD-10-CM

## 2015-01-01 DIAGNOSIS — I429 Cardiomyopathy, unspecified: Secondary | ICD-10-CM

## 2015-01-01 DIAGNOSIS — I428 Other cardiomyopathies: Secondary | ICD-10-CM

## 2015-01-01 DIAGNOSIS — C3492 Malignant neoplasm of unspecified part of left bronchus or lung: Secondary | ICD-10-CM

## 2015-01-01 DIAGNOSIS — I480 Paroxysmal atrial fibrillation: Secondary | ICD-10-CM

## 2015-01-01 DIAGNOSIS — I4891 Unspecified atrial fibrillation: Secondary | ICD-10-CM

## 2015-01-01 DIAGNOSIS — I10 Essential (primary) hypertension: Secondary | ICD-10-CM

## 2015-01-01 LAB — POCT INR
INR: 2.6
INR: 2.6

## 2015-01-01 NOTE — Patient Instructions (Signed)
No changes in  Medication  Your physician wants you to follow-up in 12 months Dr Ellyn Hack.  You will receive a reminder letter in the mail two months in advance. If you don't receive a letter, please call our office to schedule the follow-up appointment.

## 2015-01-03 ENCOUNTER — Encounter: Payer: Self-pay | Admitting: Cardiology

## 2015-01-03 NOTE — Assessment & Plan Note (Signed)
Currently seeing oncology. It would appear that they are planning expectant management now. Have discussed the possibility of hospice care.

## 2015-01-03 NOTE — Progress Notes (Signed)
PCP: Simona Huh, MD  Clinic Note: Chief Complaint  Patient presents with  . Follow-up    6 months, pt c/o SOB, denied chest pain  . Atrial Fibrillation   HPI: Brent Wade is a 74 y.o. male with a PMH below who presents today for combination ~6 month f/u & f/u s/p DCCV. on3/9 & 3/23 of 2015 he was noted to have recurrence of Afib with RVR noted during a Bronchoscopy procedure byDr. Melvyn Novas as part of his ongoing Rx for Lung CA. With the onset of Afi b - RVR, he noted dyspnea. He was loaded with Digoxin & scheduled for TEE-DCCV on 4/6 when he was still noted in Afib-RV (controlled after Digoxin, TEE b/c INR subtherapeutic). He was successfully cardioverted after 2 shocks.   Interval History: he remained stable oral cardiac standpoint with no further episodes to suggest rapid irregular heartbeats consistent with atrial fib recurrence. No bleeding complications from warfarin such as melena, hematochezia, hematuria, nosebleeds. He really only notes mild nuisance oozing. He has baseline exertional dyspnea from underlying lung disease but never had any chest tightness pressure with rest or exertion. He was recently seen by his oncologist suggestion of possibly considering referral to hospice. He was not overly excited about that concept however.  Past Medical History  Diagnosis Date  . Hyperlipidemia   . Type II or unspecified type diabetes mellitus without mention of complication, not stated as uncontrolled     twice/week  . Chronic airway obstruction, not elsewhere classified     HFA 25-50% 07-16-2010> 100% 01-28-2011; PFT's 08-13-10 FEV1 .93(34%) ratio 37 with 23% better p B2 DLCO 69%  . Shortness of breath   . Hypertension   . Arthritis     in hands  . PAF (paroxysmal atrial fibrillation) Nov 2014    Atrial Fibrillation; takes Cardizem, Digoxi, on Warfarin;; TEE DCCV 03/18/2014   . Lung cancer 11/06/2013    Diagnosed during bronchoscopy  . Radiation 12/25/13-01/24/14    Right upper  lobe lung  . Adenocarcinoma, lung 07/06/2011    Followed in Pulmonary clinic/ [facility]/ [place]   - See cxr 07/06/2011 >  No change 09/24/11 > no change 03/27/2012 > ? slt progression 11/16/2012 > def progression 07/03/2013 - CT chest 08/16/13 1. Aggressive appearing right upper lobe pulmonary mass and nodule  with a right hilar lymphadenopathy, as detailed above. This is  favored to represent a primary bronchogenic neoplasm with nodal  metastasis to the right hilum and satellite nodule in the same lobe  (i.e., T3, N1, Mx disease), however, this could alternatively  represent synchronous right upper lobe neoplasms, or less likely  an aggressive atypical infectious process such as a fungal  pneumonia (not strongly favored). Clinical correlation is strongly  recommended, with consideration for PET CT and/or biopsy at this  time.  2. In addition, there is a nonspecific 1.7 x 1.4 cm ground-glass  attenuation nodule in the left lower lobe which warrants attention  on follow-up studies - fob 08/21/13 > extrinsic compression of BI/ wang and tbbx >     Past Cardiovascular Procedures/ Surgeries   Procedure Laterality Date  . Transthoracic echocardiogram  10/29/2013    Mild-moderately reduced EF, 40-45%. Diffuse HK. Mild MR. Mild LA/RA dilation; no determination of elevated pulmonary arterial pressures   . Tee without cardioversion N/A 03/18/2014    Procedure: TRANSESOPHAGEAL ECHOCARDIOGRAM (TEE);  Surgeon: [name], MD;  Location: [facility] ENDOSCOPY;  Service: Cardiovascular;  Laterality: N/A; -- Mild Conc LVH, EF  45-50%, dilated LA no LAA thrombus; normal valves  . Cardioversion N/A 03/18/2014    Procedure: SUCCESSFUL CARDIOVERSION;  Surgeon: [name], MD;  Location: [facility] ENDOSCOPY;  Service: Cardiovascular;  Laterality: N/A;  . Nm myoview ltd  11/2013    No ischemia or infarction   No Known Allergies Current Outpatient Prescriptions on File Prior to Visit  Medication Sig Dispense Refill  . Aclidinium  Bromide (TUDORZA PRESSAIR) 400 MCG/ACT AEPB Inhale 1 puff into the lungs 2 (two) times daily. One twice daily 1 each 11  . albuterol (PROVENTIL HFA;VENTOLIN HFA) 108 (90 BASE) MCG/ACT inhaler Inhale 2 puffs into the lungs every 6 (six) hours as needed for wheezing or shortness of breath. 1 Inhaler 6  . albuterol (PROVENTIL) (2.5 MG/3ML) 0.083% nebulizer solution Take 3 mLs (2.5 mg total) by nebulization every 6 (six) hours as needed for wheezing or shortness of breath. 75 mL 12  . budesonide-formoterol (SYMBICORT) 160-4.5 MCG/ACT inhaler Inhale 2 puffs into the lungs 2 (two) times daily.      . digoxin (LANOXIN) 0.125 MG tablet Take 1 tablet (0.125 mg total) by mouth daily. 90 tablet 3  . diltiazem (CARDIZEM) 60 MG tablet TAKE ONE TABLET BY MOUTH TWICE DAILY 60 tablet 9  . doxazosin (CARDURA) 2 MG tablet Take 2 mg by mouth daily.      . metFORMIN (GLUCOPHAGE) 500 MG tablet Take 500 mg by mouth 2 (two) times daily with a meal.     . oxyCODONE-acetaminophen (PERCOCET/ROXICET) 5-325 MG per tablet Take 1 tablet by mouth every 4 (four) hours as needed for severe pain. 90 tablet 0  . simvastatin (ZOCOR) 40 MG tablet Take 0.5 tablets (20 mg total) by mouth at bedtime. 30 tablet 6  . warfarin (COUMADIN) 5 MG tablet Take 1 to 1.5 tablets by mouth daily as directed 35 tablet 3   No current facility-administered medications on file prior to visit.    No Change in Social and Family History  ROS: A comprehensive Review of Systems - Negative except mild residual cough (chronic); otherwise negative  Review of Systems  Constitutional: Negative for fever, chills and weight loss.  HENT: Negative for nosebleeds.   Respiratory: Negative for hemoptysis.   Cardiovascular: Negative for claudication.  Gastrointestinal: Negative for blood in stool and melena.  Genitourinary: Negative for hematuria.  Neurological: Negative for weakness.  Endo/Heme/Allergies: Bruises/bleeds easily.  Psychiatric/Behavioral: Positive  for depression (Somewhat depressed about his diagnosis of lung cancer).  All other systems reviewed and are negative. '  PHYSICAL EXAM BP 110/60 mmHg  Pulse 73  Ht 5\' 8"  (1.727 m)  Wt 166 lb 9.6 oz (75.569 kg)  BMI 25.34 kg/m2 General appearance: Alert and oriented X 3, cooperative, appears stated age, no distress and pleasant mood and affect. Well-nourished and well-groomed. Answers questions appropriately.  HEENT: [place]/AT, EOMI, MMM, anicteric sclera; mild arcus sinilus  Neck: no adenopathy, no carotid bruit, no JVD, supple, symmetrical, trachea midline and thyroid not enlarged, symmetric, no tenderness/mass/nodules  Lungs: Mostly CTA B. with mild end expiratory wheezing bilaterally; nonlabored. No rales or rhonchi.  Heart: normal apical impulse and irregularly irregular rate and rhythm. Tachycardic with no obvious rubs or gallops. There is a soft 1/6 SEM at RUSB.  Abdomen: soft, non-tender; bowel sounds normal; no masses, no organomegaly  Extremities: No clubbing, cyanosis with trace lower extremity edema; Pulses: 2+ and symmetric    Adult ECG Report  Rate: 73 ;  Rhythm: normal sinus rhythm; normal axis, intervals & voltage.  Narrative Interpretation: Normal ECG with mild digoxin effect. -- stable  Recent Labs from September 2015:  Total cholesterol 137, triglyceride 200, HDL 49, LDL 48.  Sodium 138, potassium 4.5, chloride 101, bicarbonate 32, BUN 14, creatinine 1.02, calcium 9.7.  ASSESSMENT / PLAN: PAF (paroxysmal atrial fibrillation) Stable status post DCCV. Maintained on low-dose diltiazem plus digoxin. On warfarin for anticoagulation no bleeding complaints.   Non-ischemic cardiomyopathy - mild-moderate (EF ~45%), Negative Myoview. Likely nonischemic cardiomyopathy.no active symptoms and as he is not [place] to include diltiazem. Blood pressure not high enough to tolerate the addition of ARB.   Essential hypertension Almost hypotensive on simple regimen as noted  above.   Hyperlipidemia with target LDL less than 100 On statin. Marker by PCP. Bilateral results in September these values appear to be well controlled.   Dyspnea on exertion Unlikely to be very much related to cardiac etiology with the exception of mild reduced systolic function with likely diastolic dysfunction. It is probably more related to his lung function.   Long term current use of anticoagulant therapy On warfarin. Stable   Adenocarcinoma, lung Currently seeing oncology. It would appear that they are planning expectant management now. Have discussed the possibility of hospice care.     Orders Placed This Encounter  Procedures  . EKG 12-Lead   No orders of the defined types were placed in this encounter.    Followup: 6 months  Brent Wade, M.D., M.S. Interventional Cardiologist [facility]

## 2015-01-03 NOTE — Assessment & Plan Note (Signed)
Unlikely to be very much related to cardiac etiology with the exception of mild reduced systolic function with likely diastolic dysfunction. It is probably more related to his lung function.

## 2015-01-03 NOTE — Assessment & Plan Note (Signed)
Likely nonischemic cardiomyopathy.no active symptoms and as he is not [place] to include diltiazem. Blood pressure not high enough to tolerate the addition of ARB.

## 2015-01-03 NOTE — Assessment & Plan Note (Signed)
Almost hypotensive on simple regimen as noted above.

## 2015-01-03 NOTE — Assessment & Plan Note (Signed)
On warfarin. Stable 

## 2015-01-03 NOTE — Assessment & Plan Note (Signed)
On statin. Marker by PCP. Bilateral results in September these values appear to be well controlled.

## 2015-01-03 NOTE — Assessment & Plan Note (Signed)
Stable status post DCCV. Maintained on low-dose diltiazem plus digoxin. On warfarin for anticoagulation no bleeding complaints.

## 2015-02-10 ENCOUNTER — Telehealth: Payer: Self-pay

## 2015-02-10 NOTE — Telephone Encounter (Signed)
Patient called to get refill on oxycodone.last refilled on 12/31/14.Last follow up on 09/24/15.When asked if he has decided to be followed by hospice he stated "I don't think I am ready for that right now."

## 2015-02-12 ENCOUNTER — Ambulatory Visit (INDEPENDENT_AMBULATORY_CARE_PROVIDER_SITE_OTHER): Payer: Medicare Other | Admitting: Pharmacist Clinician (PhC)/ Clinical Pharmacy Specialist

## 2015-02-12 ENCOUNTER — Other Ambulatory Visit: Payer: Self-pay | Admitting: Radiation Oncology

## 2015-02-12 DIAGNOSIS — Z7901 Long term (current) use of anticoagulants: Secondary | ICD-10-CM

## 2015-02-12 DIAGNOSIS — C3492 Malignant neoplasm of unspecified part of left bronchus or lung: Secondary | ICD-10-CM

## 2015-02-12 LAB — POCT INR: INR: 2.3

## 2015-02-12 MED ORDER — OXYCODONE-ACETAMINOPHEN 5-325 MG PO TABS
1.0000 | ORAL_TABLET | ORAL | Status: DC | PRN
Start: 2015-02-12 — End: 2015-02-12

## 2015-02-12 MED ORDER — OXYCODONE-ACETAMINOPHEN 5-325 MG PO TABS
1.0000 | ORAL_TABLET | ORAL | Status: DC | PRN
Start: 2015-02-12 — End: 2015-03-21

## 2015-02-24 ENCOUNTER — Other Ambulatory Visit: Payer: Self-pay | Admitting: *Deleted

## 2015-02-24 MED ORDER — ACLIDINIUM BROMIDE 400 MCG/ACT IN AEPB
1.0000 | INHALATION_SPRAY | Freq: Two times a day (BID) | RESPIRATORY_TRACT | Status: DC
Start: 2015-02-24 — End: 2016-01-29

## 2015-03-21 ENCOUNTER — Ambulatory Visit
Admission: RE | Admit: 2015-03-21 | Discharge: 2015-03-21 | Disposition: A | Discharge status: Home / Self Care | Payer: Medicare Other | Source: Ambulatory Visit | Attending: Radiation Oncology | Admitting: Radiation Oncology

## 2015-03-21 VITALS — BP 120/68 | HR 86 | Temp 97.7°F | Wt 166.0 lb

## 2015-03-21 DIAGNOSIS — C3491 Malignant neoplasm of unspecified part of right bronchus or lung: Secondary | ICD-10-CM

## 2015-03-21 MED ORDER — OXYCODONE-ACETAMINOPHEN 5-325 MG PO TABS: 1.0000 | ORAL_TABLET | ORAL | Status: DC | PRN

## 2015-03-21 NOTE — Progress Notes (Signed)
Routine follow up SBRT of right upper lobe lesion completed 01/24/14.Continued pain of right lateral chest wall.Has had no pain medication in 2 days.shortness of breath on exertion.white productive sputum.Last ct of chest September 2015.

## 2015-03-22 ENCOUNTER — Other Ambulatory Visit: Payer: Self-pay | Admitting: Pharmacist Clinician (PhC)/ Clinical Pharmacy Specialist

## 2015-03-22 MED ORDER — WARFARIN SODIUM 5 MG PO TABS: ORAL_TABLET | ORAL | Status: DC

## 2015-03-25 NOTE — Progress Notes (Signed)
   Department of Radiation Oncology  Phone:  [phone] Fax:        [phone]   Name: Brent Wade MRN: [id number]  DOB: [birth year]  Date: 03/21/2015  Follow Up Visit Note  Diagnosis:    ICD-9-CM ICD-10-CM   1. 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  He is feeling well. He mows his yard and performs all of his ADLs. He uses 1-2 hydrocodone a day for right chest wall pain and dyspnea. He does not feel that hospice is appropriate at this time. He is accompanied by his 2 daughters. He denies hemoptysis or headaches.   Physical Exam: Pleasant male. No distress. Clear breath sounds.  Filed Vitals:   03/21/15 1450  BP: 120/68  Pulse: 86  Temp: 97.7 F (36.5 C)  Weight: 166 lb (75.297 kg)  SpO2: 99%   IMPRESSION: Brent Wade is a 74 y.o. male s/p radiation to 2 lesions in the right lung with progressive hilar/mediastinal adenopathy, currently on observation.   PLAN:  He is stable clinically. I have refilled his prescription. He will call in for refills monthly and I will see him back  In 3-4 months. He knows he can call if he experiences worsening symptoms and be seen earlier. I don't think there is a need for any further studies as he would not be a good candidate for salvage chemotherapy or radiation and he is not interested in these either.    Brent Silversmith, MD

## 2015-03-26 ENCOUNTER — Ambulatory Visit (INDEPENDENT_AMBULATORY_CARE_PROVIDER_SITE_OTHER): Payer: Medicare Other | Admitting: Pharmacist Clinician (PhC)/ Clinical Pharmacy Specialist

## 2015-03-26 DIAGNOSIS — Z7901 Long term (current) use of anticoagulants: Secondary | ICD-10-CM

## 2015-03-26 DIAGNOSIS — I4891 Unspecified atrial fibrillation: Secondary | ICD-10-CM

## 2015-03-26 LAB — POCT INR: INR: 3.3

## 2015-04-08 IMAGING — CT CT CHEST W/ CM
2 of 3 series · 14 of 36 positions shown, 17 images · IV contrast (Omnipaque 300)
Comparison: Chest x-ray 06/13/2013.

CLINICAL DATA: Possible right upper lobe pulmonary nodule.

CT CHEST WITH CONTRAST
TECHNIQUE: Multidetector CT imaging of the chest was performed
following the standard protocol during bolus administration of
intravenous contrast.
Contrast: 80mL OMNIPAQUE IOHEXOL 300 MG/ML  SOLN

[Series 2: chest routine with · axial · 0.76mm/px · z∈[-283,-28]mm · 11 of 61 slices shown, 14 images]
[im 5/61  mediastinal]
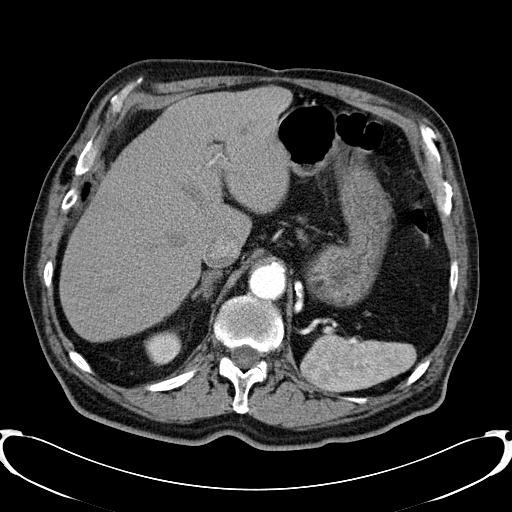
[im 5/61  lung]
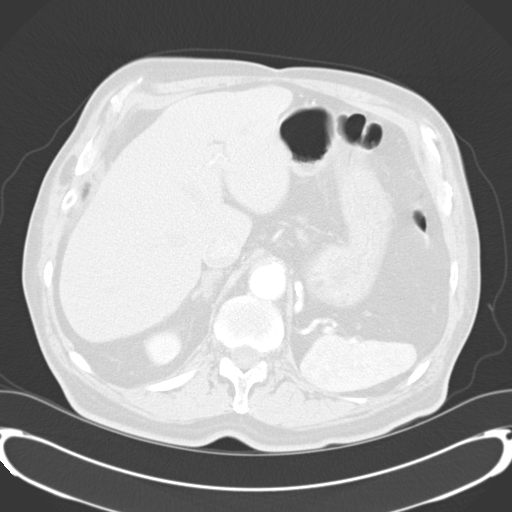
[im 9/61  lung]
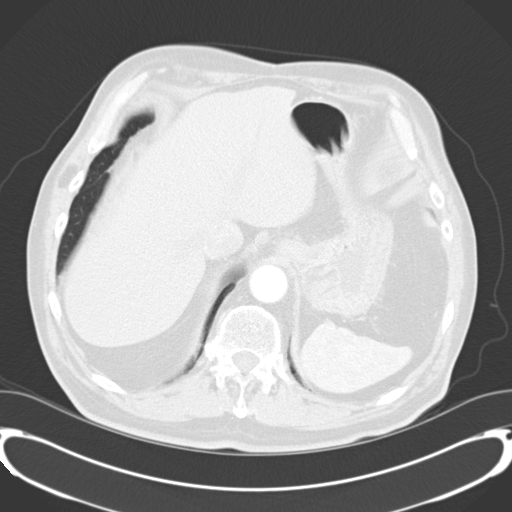
[im 14/61  lung]
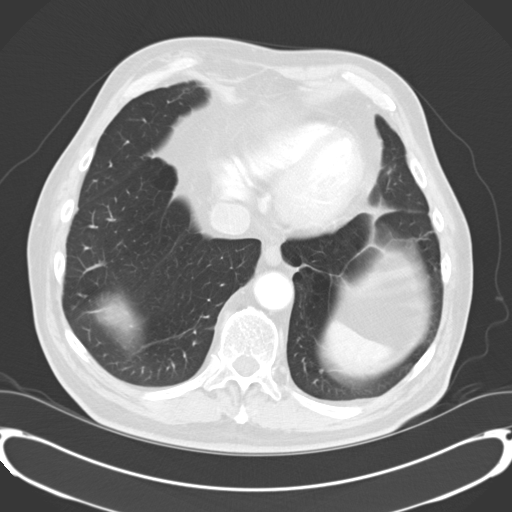
[im 21/61  lung]
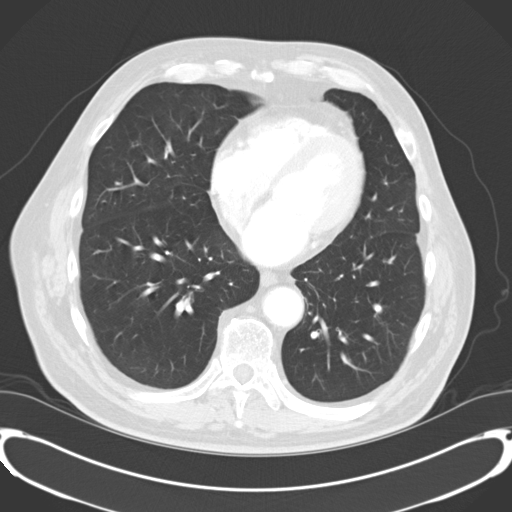
[im 25/61  mediastinal]
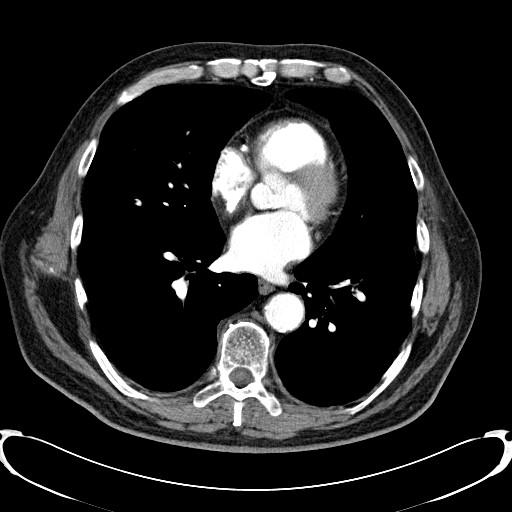
[im 25/61  lung]
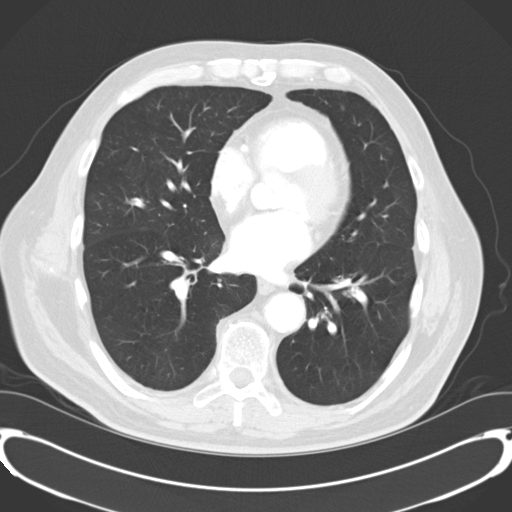
[im 32/61  lung]
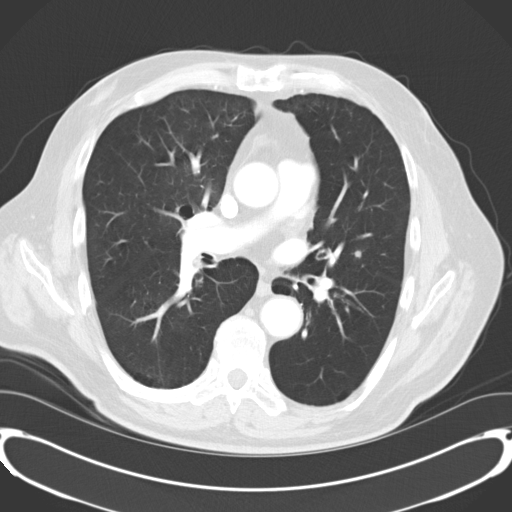
[im 36/61  lung]
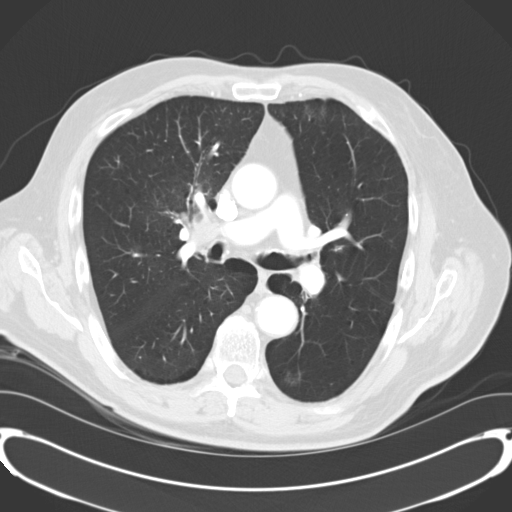
[im 41/61  lung]
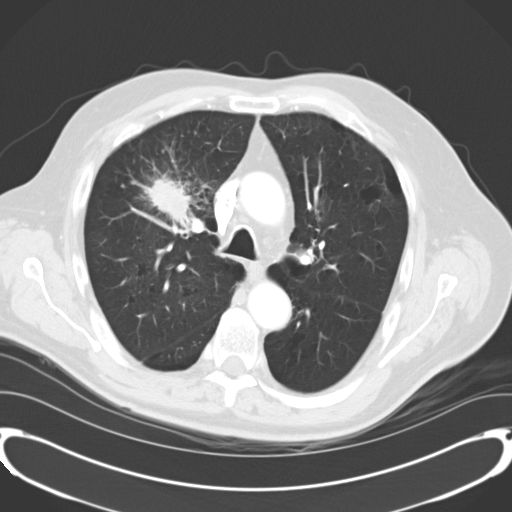
[im 47/61  mediastinal]
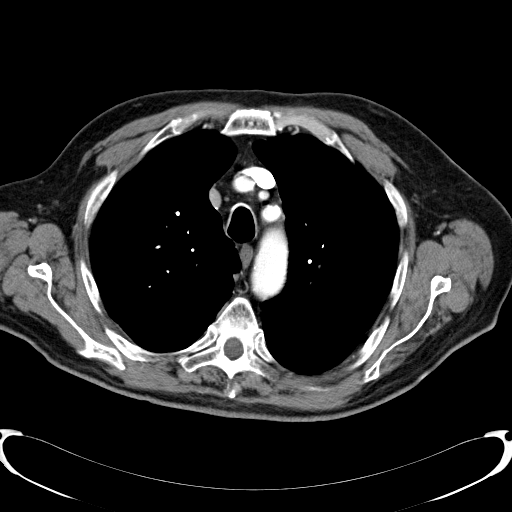
[im 47/61  lung]
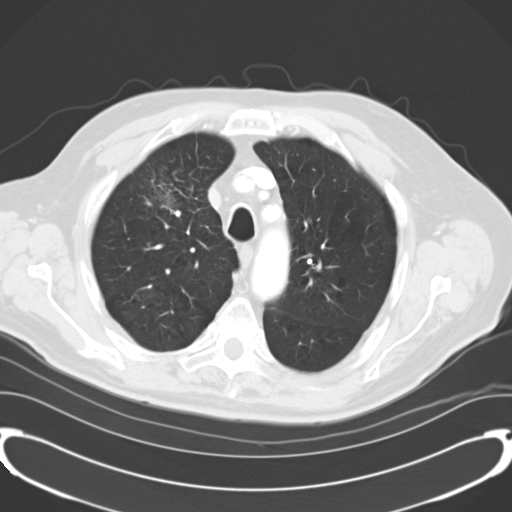
[im 52/61  lung]
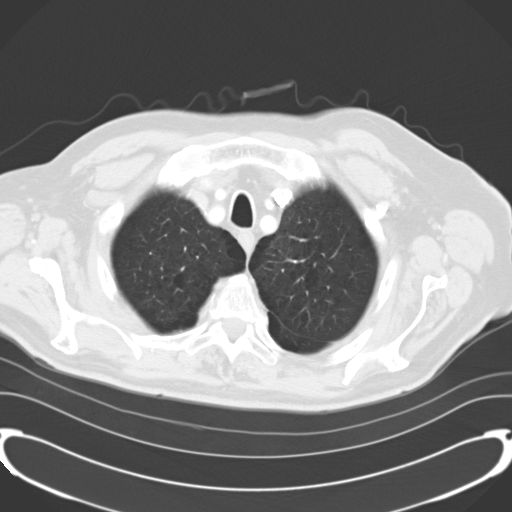
[im 56/61  lung]
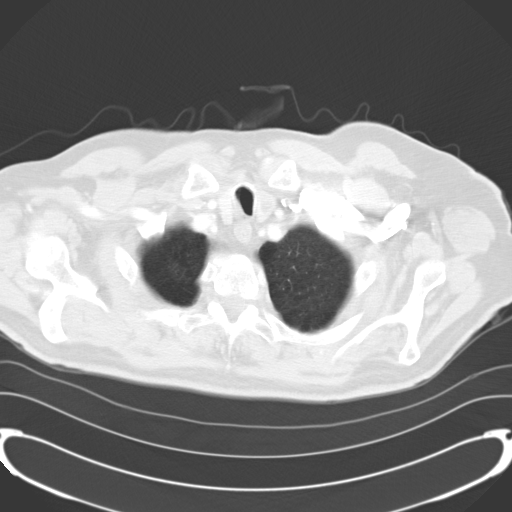

[Series 602: cor · coronal · 0.76mm/px · 3 of 132 slices shown]
[im 27/132  lung]
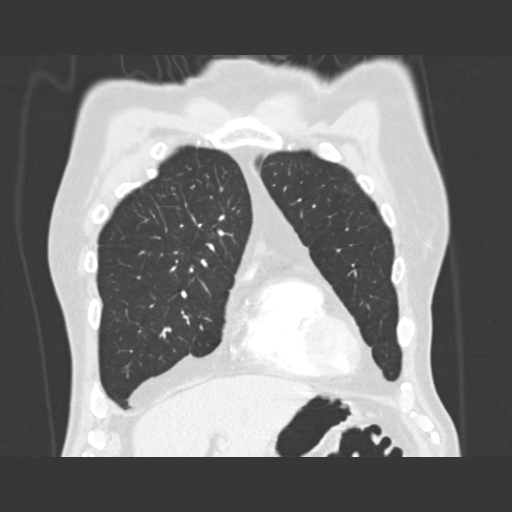
[im 53/132  lung]
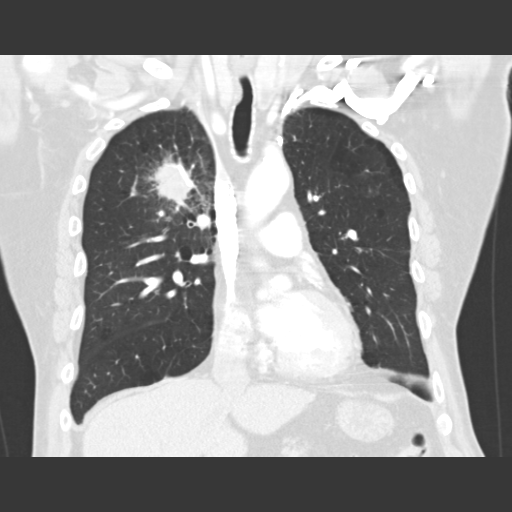
[im 79/132  lung]
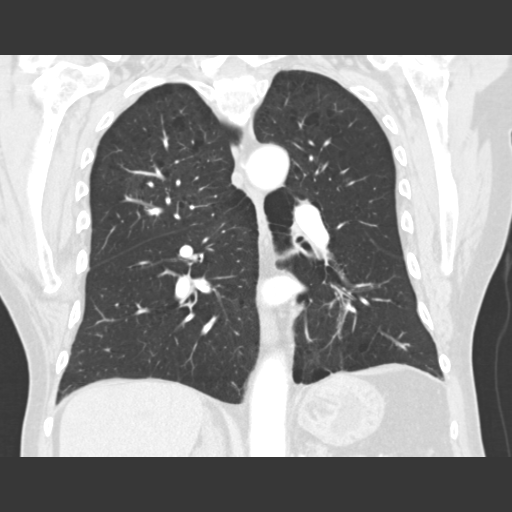

[14 of 36 positions shown; findings below may reference images not displayed]

FINDINGS: Mediastinum: Heart size is normal. There is no significant
pericardial fluid, thickening or pericardial calcification. There
is atherosclerosis of the thoracic aorta, the great vessels of the
mediastinum and the coronary arteries, including calcified
atherosclerotic plaque in the left main, left anterior descending,
left circumflex and right coronary arteries. Enlarged right hilar
lymph node measuring 1.6 x 2.2 cm (image 26 of series 2).  No
definite pathologically enlarged mediastinal or left hilar lymph
nodes are noted.  Esophagus is unremarkable in appearance.  Mild
calcifications of the aortic valve.

Lungs/Pleura: In the right upper lobe there is a 4.2 x 2.9 cm
macrolobulated mass with spiculated margins (image 20 of series 3).
In addition, more posteriorly in the right upper lobe there is a
1.5 x 1.4 cm macrolobulated nodule with some spiculated margins.  A
1.7 x 1.4 cm nodular area of ground-glass attenuation in the
posterior aspect of the left lower lobe (image 26 of series three)
is also noted.  There is a background of mild to moderate central
lobular and paraseptal emphysema.

Upper Abdomen: Surgical clips near the celiac axis.  Mild
adreniform thickening of the right adrenal gland which is
incompletely visualized.

Musculoskeletal: A small fatty attenuation lesion in the right
serratus anterior musculature, likely represent an intramuscular
lipoma. There are no aggressive appearing lytic or blastic lesions
noted in the visualized portions of the skeleton.
IMPRESSION: 1.  Aggressive appearing right upper lobe pulmonary mass and nodule
with a right hilar lymphadenopathy, as detailed above.  This is
favored to represent a primary bronchogenic neoplasm with nodal
metastasis to the right hilum and satellite nodule in the same lobe
(i.e., T3, N1, Chai disease), however, this could alternatively
represent  synchronous right upper lobe neoplasms, or less likely
an aggressive atypical infectious process such as a fungal
pneumonia (not strongly favored). Clinical correlation is strongly
recommended, with consideration for PET CT and/or biopsy at this
time.
2.  In addition, there is a nonspecific 1.7 x 1.4 cm ground-glass
attenuation nodule in the left lower lobe which warrants attention
on follow-up studies.  Per guidelines the initial follow-up by
chest CT without contrast is recommended in 3 months to confirm
persistence, however, this lesion could likely be followed on any
routine follow-up scans performed for the findings in number 1
above.
3. Atherosclerosis, including left main and three-vessel coronary
artery disease.  Assessment for potential risk factor modification,
dietary therapy or pharmacologic therapy may be warranted, if
clinically indicated.
4. There are calcifications of the aortic valve.  Echocardiographic
correlation for evaluation of potential valvular dysfunction may be
warranted if clinically indicated.
5.  Additional incidental findings, as above.

to Dr. Chika, who verbally acknowledged these results.

## 2015-04-17 ENCOUNTER — Other Ambulatory Visit: Payer: Self-pay | Admitting: Radiation Oncology

## 2015-04-17 ENCOUNTER — Telehealth: Payer: Self-pay

## 2015-04-17 MED ORDER — OXYCODONE-ACETAMINOPHEN 5-325 MG PO TABS: 1.0000 | ORAL_TABLET | Freq: Two times a day (BID) | ORAL | Status: DC | PRN

## 2015-04-17 NOTE — Telephone Encounter (Signed)
Patient called for oxycodone refill.He will pick up on 04/18/15 around 10:00 am.Patient states he take 2 to 3 pills daily depending on pain level."Sometimes I take 1 through the night as I wake up and am unable to get back to sleep because of the pain."I did confirm with patient that he is the only one taking the prescribed medication and asked if he felt safe in his environment and he responded "I just do have enough for me never mind me sharing with anyone.informed patient to keep calendar and write his daily pain level and how many pills he takes per day in the event his medication needs to be increased or changed to something else.

## 2015-04-23 ENCOUNTER — Ambulatory Visit (INDEPENDENT_AMBULATORY_CARE_PROVIDER_SITE_OTHER): Payer: Medicare Other | Admitting: Pharmacist Clinician (PhC)/ Clinical Pharmacy Specialist

## 2015-04-23 DIAGNOSIS — Z7901 Long term (current) use of anticoagulants: Secondary | ICD-10-CM | POA: Diagnosis not present

## 2015-04-23 DIAGNOSIS — I4891 Unspecified atrial fibrillation: Secondary | ICD-10-CM

## 2015-04-23 LAB — POCT INR: INR: 3.5

## 2015-04-25 ENCOUNTER — Emergency Department (HOSPITAL_COMMUNITY)
Admission: EM | Admit: 2015-04-25 | Discharge: 2015-04-25 | Disposition: A | Discharge status: Home / Self Care | Payer: Medicare Other | Attending: Emergency Medicine | Admitting: Emergency Medicine

## 2015-04-25 ENCOUNTER — Emergency Department (HOSPITAL_COMMUNITY): Payer: Medicare Other

## 2015-04-25 ENCOUNTER — Encounter (HOSPITAL_COMMUNITY): Payer: Self-pay | Admitting: Emergency Medicine

## 2015-04-25 DIAGNOSIS — J441 Chronic obstructive pulmonary disease with (acute) exacerbation: Secondary | ICD-10-CM | POA: Insufficient documentation

## 2015-04-25 DIAGNOSIS — I10 Essential (primary) hypertension: Secondary | ICD-10-CM | POA: Insufficient documentation

## 2015-04-25 DIAGNOSIS — Z79899 Other long term (current) drug therapy: Secondary | ICD-10-CM | POA: Insufficient documentation

## 2015-04-25 DIAGNOSIS — R0602 Shortness of breath: Secondary | ICD-10-CM | POA: Diagnosis present

## 2015-04-25 DIAGNOSIS — M1990 Unspecified osteoarthritis, unspecified site: Secondary | ICD-10-CM | POA: Insufficient documentation

## 2015-04-25 DIAGNOSIS — Z7901 Long term (current) use of anticoagulants: Secondary | ICD-10-CM | POA: Insufficient documentation

## 2015-04-25 DIAGNOSIS — E119 Type 2 diabetes mellitus without complications: Secondary | ICD-10-CM | POA: Diagnosis not present

## 2015-04-25 DIAGNOSIS — N390 Urinary tract infection, site not specified: Secondary | ICD-10-CM | POA: Diagnosis not present

## 2015-04-25 DIAGNOSIS — E785 Hyperlipidemia, unspecified: Secondary | ICD-10-CM | POA: Insufficient documentation

## 2015-04-25 DIAGNOSIS — Z85118 Personal history of other malignant neoplasm of bronchus and lung: Secondary | ICD-10-CM | POA: Insufficient documentation

## 2015-04-25 DIAGNOSIS — Z87891 Personal history of nicotine dependence: Secondary | ICD-10-CM | POA: Insufficient documentation

## 2015-04-25 LAB — CBC
HEMATOCRIT: 41.2 % (ref 39.0–52.0)
Hemoglobin: 13.5 g/dL (ref 13.0–17.0)
MCH: 30.3 pg (ref 26.0–34.0)
MCHC: 32.8 g/dL (ref 30.0–36.0)
MCV: 92.4 fL (ref 78.0–100.0)
PLATELETS: 303 10*3/uL (ref 150–400)
RBC: 4.46 MIL/uL (ref 4.22–5.81)
RDW: 13 % (ref 11.5–15.5)
WBC: 8.3 10*3/uL (ref 4.0–10.5)

## 2015-04-25 LAB — I-STAT TROPONIN, ED: Troponin i, poc: 0 ng/mL (ref 0.00–0.08)

## 2015-04-25 LAB — URINALYSIS, ROUTINE W REFLEX MICROSCOPIC
Bilirubin Urine: NEGATIVE
GLUCOSE, UA: NEGATIVE mg/dL
Hgb urine dipstick: NEGATIVE
Ketones, ur: NEGATIVE mg/dL
NITRITE: NEGATIVE
PH: 6.5 (ref 5.0–8.0)
Protein, ur: NEGATIVE mg/dL
Specific Gravity, Urine: 1.007 (ref 1.005–1.030)
UROBILINOGEN UA: 0.2 mg/dL (ref 0.0–1.0)

## 2015-04-25 LAB — BASIC METABOLIC PANEL
Anion gap: 11 (ref 5–15)
BUN: 16 mg/dL (ref 6–20)
CO2: 28 mmol/L (ref 22–32)
CREATININE: 0.95 mg/dL (ref 0.61–1.24)
Calcium: 9.8 mg/dL (ref 8.9–10.3)
Chloride: 97 mmol/L — ABNORMAL LOW (ref 101–111)
GFR calc non Af Amer: >60 mL/min (ref >60)
Glucose, Bld: 148 mg/dL — ABNORMAL HIGH (ref 65–99)
Potassium: 4.5 mmol/L (ref 3.5–5.1)
Sodium: 136 mmol/L (ref 135–145)

## 2015-04-25 LAB — PROTIME-INR
INR: 1.82 — ABNORMAL HIGH (ref 0.00–1.49)
Prothrombin Time: 21.2 seconds — ABNORMAL HIGH (ref 11.6–15.2)

## 2015-04-25 LAB — URINE MICROSCOPIC-ADD ON

## 2015-04-25 LAB — APTT: aPTT: 40 seconds — ABNORMAL HIGH (ref 24–37)

## 2015-04-25 LAB — DIGOXIN LEVEL: Digoxin Level: 0.4 ng/mL — ABNORMAL LOW (ref 0.8–2.0)

## 2015-04-25 LAB — BRAIN NATRIURETIC PEPTIDE: B Natriuretic Peptide: 34.3 pg/mL (ref 0.0–100.0)

## 2015-04-25 MED ORDER — IPRATROPIUM-ALBUTEROL 0.5-2.5 (3) MG/3ML IN SOLN
3.0000 mL | Freq: Once | RESPIRATORY_TRACT | Status: AC
  Administered 2015-04-25: 3 mL via RESPIRATORY_TRACT
  Filled 2015-04-25: qty 3

## 2015-04-25 MED ORDER — PREDNISONE 50 MG PO TABS: 50.0000 mg | ORAL_TABLET | Freq: Every day | ORAL | Status: DC

## 2015-04-25 MED ORDER — DEXTROSE 5 % IV SOLN: 1.0000 g | Freq: Once | INTRAVENOUS | Status: DC

## 2015-04-25 MED ORDER — LEVOFLOXACIN 500 MG PO TABS: 500.0000 mg | ORAL_TABLET | Freq: Every day | ORAL | Status: DC

## 2015-04-25 MED ORDER — ALBUTEROL SULFATE (2.5 MG/3ML) 0.083% IN NEBU
5.0000 mg | INHALATION_SOLUTION | Freq: Once | RESPIRATORY_TRACT | Status: AC
  Administered 2015-04-25: 5 mg via RESPIRATORY_TRACT
  Filled 2015-04-25: qty 6

## 2015-04-25 MED ORDER — IPRATROPIUM BROMIDE 0.02 % IN SOLN
0.5000 mg | Freq: Once | RESPIRATORY_TRACT | Status: AC
  Administered 2015-04-25: 0.5 mg via RESPIRATORY_TRACT
  Filled 2015-04-25: qty 2.5

## 2015-04-25 MED ORDER — PREDNISONE 20 MG PO TABS
60.0000 mg | ORAL_TABLET | Freq: Once | ORAL | Status: AC
  Administered 2015-04-25: 60 mg via ORAL
  Filled 2015-04-25: qty 3

## 2015-04-25 MED ORDER — LEVOFLOXACIN IN D5W 500 MG/100ML IV SOLN
500.0000 mg | Freq: Once | INTRAVENOUS | Status: AC
  Administered 2015-04-25: 500 mg via INTRAVENOUS
  Filled 2015-04-25: qty 100

## 2015-04-25 NOTE — ED Notes (Signed)
Pt has stage IV lung cancer (not currently being treated, hx radiation treatment). C/o left sided rib pain and SOB. Has productive cough-white, thick. Has inhaler at home and did utilize this today. Says, "I've had an infection in my lung before and this feels like that." RR even but labored. SOB at baseline but says this is worse than usual. No other c/c.

## 2015-04-25 NOTE — ED Notes (Signed)
Received pt from triage at this time currently receiving breathing treatment

## 2015-04-25 NOTE — Discharge Instructions (Signed)
We are treating you for urinary infection and likely COPD exacerbation. Levaquin will interfere with coumadin, so have your coumadin number checked in 3 days, or call your clinic to see if dose reduction is needed. See your doctor in 1 week.  Please return to the ER if your symptoms worsen; you have increased pain, fevers, chills, inability to keep any medications down, confusion. Otherwise see the outpatient doctor as requested.   Chronic Obstructive Pulmonary Disease Exacerbation Chronic obstructive pulmonary disease (COPD) is a common lung condition in which airflow from the lungs is limited. COPD is a general term that can be used to describe many different lung problems that limit airflow, including chronic bronchitis and emphysema. COPD exacerbations are episodes when breathing symptoms become much worse and require extra treatment. Without treatment, COPD exacerbations can be life threatening, and frequent COPD exacerbations can cause further damage to your lungs. CAUSES   Respiratory infections.   Exposure to smoke.   Exposure to air pollution, chemical fumes, or dust. Sometimes there is no apparent cause or trigger. RISK FACTORS  Smoking cigarettes.  Older age.  Frequent prior COPD exacerbations. SIGNS AND SYMPTOMS   Increased coughing.   Increased thick spit (sputum) production.   Increased wheezing.   Increased shortness of breath.   Rapid breathing.   Chest tightness. DIAGNOSIS  Your medical history, a physical exam, and tests will help your health care provider make a diagnosis. Tests may include:  A chest X-ray.  Basic lab tests.  Sputum testing.  An arterial blood gas test. TREATMENT  Depending on the severity of your COPD exacerbation, you may need to be admitted to a hospital for treatment. Some of the treatments commonly used to treat COPD exacerbations are:   Antibiotic medicines.   Bronchodilators. These are drugs that expand the air  passages. They may be given with an inhaler or nebulizer. Spacer devices may be needed to help improve drug delivery.  Corticosteroid medicines.  Supplemental oxygen therapy.  HOME CARE INSTRUCTIONS   Do not smoke. Quitting smoking is very important to prevent COPD from getting worse and exacerbations from happening as often.  Avoid exposure to all substances that irritate the airway, especially to tobacco smoke.   If you were prescribed an antibiotic medicine, finish it all even if you start to feel better.  Take all medicines as directed by your health care provider.It is important to use correct technique with inhaled medicines.  Drink enough fluids to keep your urine clear or pale yellow (unless you have a medical condition that requires fluid restriction).  Use a cool mist vaporizer. This makes it easier to clear your chest when you cough.   If you have a home nebulizer and oxygen, continue to use them as directed.   Maintain all necessary vaccinations to prevent infections.   Exercise regularly.   Eat a healthy diet.   Keep all follow-up appointments as directed by your health care provider. SEEK IMMEDIATE MEDICAL CARE IF:  You have worsening shortness of breath.   You have trouble talking.   You have severe chest pain.  You have blood in your sputum.  You have a fever.  You have weakness, vomit repeatedly, or faint.   You feel confused.   You continue to get worse. MAKE SURE YOU:   Understand these instructions.  Will watch your condition.  Will get help right away if you are not doing well or get worse. Document Released: 09/26/2007 Document Revised: 04/15/2014 Document Reviewed: 08/03/2013 Exit[employer]  Patient Information 2015 [place]. This information is not intended to replace advice given to you by your health care provider. Make sure you discuss any questions you have with your health care provider.  Urinary Tract Infection Urinary  tract infections (UTIs) can develop anywhere along your urinary tract. Your urinary tract is your body's drainage system for removing wastes and extra water. Your urinary tract includes two kidneys, two ureters, a bladder, and a urethra. Your kidneys are a pair of bean-shaped organs. Each kidney is about the size of your fist. They are located below your ribs, one on each side of your spine. CAUSES Infections are caused by microbes, which are microscopic organisms, including fungi, viruses, and bacteria. These organisms are so small that they can only be seen through a microscope. Bacteria are the microbes that most commonly cause UTIs. SYMPTOMS  Symptoms of UTIs may vary by age and gender of the patient and by the location of the infection. Symptoms in young women typically include a frequent and intense urge to urinate and a painful, burning feeling in the bladder or urethra during urination. Older women and men are more likely to be tired, shaky, and weak and have muscle aches and abdominal pain. A fever may mean the infection is in your kidneys. Other symptoms of a kidney infection include pain in your back or sides below the ribs, nausea, and vomiting. DIAGNOSIS To diagnose a UTI, your caregiver will ask you about your symptoms. Your caregiver also will ask to provide a urine sample. The urine sample will be tested for bacteria and white blood cells. White blood cells are made by your body to help fight infection. TREATMENT  Typically, UTIs can be treated with medication. Because most UTIs are caused by a bacterial infection, they usually can be treated with the use of antibiotics. The choice of antibiotic and length of treatment depend on your symptoms and the type of bacteria causing your infection. HOME CARE INSTRUCTIONS  If you were prescribed antibiotics, take them exactly as your caregiver instructs you. Finish the medication even if you feel better after you have only taken some of the  medication.  Drink enough water and fluids to keep your urine clear or pale yellow.  Avoid caffeine, tea, and carbonated beverages. They tend to irritate your bladder.  Empty your bladder often. Avoid holding urine for long periods of time.  Empty your bladder before and after sexual intercourse.  After a bowel movement, women should cleanse from front to back. Use each tissue only once. SEEK MEDICAL CARE IF:   You have back pain.  You develop a fever.  Your symptoms do not begin to resolve within 3 days. SEEK IMMEDIATE MEDICAL CARE IF:   You have severe back pain or lower abdominal pain.  You develop chills.  You have nausea or vomiting.  You have continued burning or discomfort with urination. MAKE SURE YOU:   Understand these instructions.  Will watch your condition.  Will get help right away if you are not doing well or get worse. Document Released: 09/08/2005 Document Revised: 05/30/2012 Document Reviewed: 01/07/2012 [facility] Patient Information 2015 [place], [place]. This information is not intended to replace advice given to you by your health care provider. Make sure you discuss any questions you have with your health care provider.

## 2015-04-25 NOTE — ED Provider Notes (Signed)
CSN: [id number]     Arrival date & time 04/25/15  1829 History   First MD Initiated Contact with Patient 04/25/15 1918     Chief Complaint  Patient presents with  . Cancer patient   . Shortness of Breath  . Wheezing     (Consider location/radiation/quality/duration/timing/severity/associated sxs/prior Treatment) HPI Comments: Pt comes in with cc of L sided chest pain/rib pain and some dib. Pt has hx of stage 4 lung CA, s/p chemo and radiation, now just being watched, COPD, CHF. Pt reports that he has been having some L sided chest pain. Chest pain is sharp, lower part of his chest. Pain is off and on. Pain is not exertional or pleuritic. There is no new cough, pt has some clear phlegm with the cough. Pt has no hemoptysis. + dib, with exertion - typically walking about 300 feet. No orthopnea, PND, weight gain. Pt is on coumadin due to afib, no hx of PE. Pt has no fevers, +wheezing - with transient relief from the inhalers.   ROS 10 Systems reviewed and are negative for acute change except as noted in the HPI.     Patient is a 74 y.o. male presenting with shortness of breath and wheezing. The history is provided by the patient.  Shortness of Breath Associated symptoms: cough and wheezing   Wheezing Associated symptoms: cough and shortness of breath     Past Medical History  Diagnosis Date  . Hyperlipidemia   . Type II or unspecified type diabetes mellitus without mention of complication, not stated as uncontrolled     twice/week  . Chronic airway obstruction, not elsewhere classified     HFA 25-50% 07-16-2010> 100% 01-28-2011; PFT's 08-13-10 FEV1 .93(34%) ratio 37 with 23% better p B2 DLCO 69%  . Shortness of breath   . Hypertension   . Arthritis     in hands  . PAF (paroxysmal atrial fibrillation) Nov 2014    Atrial Fibrillation; takes Cardizem, Digoxi, on Warfarin;; TEE DCCV 03/18/2014   . Lung cancer 11/06/2013    Diagnosed during bronchoscopy  . Radiation 12/25/13-01/24/14     Right upper lobe lung  . 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 slt progression 11/16/2012 > def progression 07/03/2013 - CT chest 08/16/13 1. Aggressive appearing right upper lobe pulmonary mass and nodule  with a right hilar lymphadenopathy, as detailed above. This is  favored to represent a primary bronchogenic neoplasm with nodal  metastasis to the right hilum and satellite nodule in the same lobe  (i.e., T3, N1, Mx disease), however, this could alternatively  represent synchronous right upper lobe neoplasms, or less likely  an aggressive atypical infectious process such as a fungal  pneumonia (not strongly favored). Clinical correlation is strongly  recommended, with consideration for PET CT and/or biopsy at this  time.  2. In addition, there is a nonspecific 1.7 x 1.4 cm ground-glass  attenuation nodule in the left lower lobe which warrants attention  on follow-up studies - fob 08/21/13 > extrinsic compression of BI/ wang and tbbx >    Past Surgical History  Procedure Laterality Date  . Video bronchoscopy Bilateral 08/21/2013    Procedure: VIDEO BRONCHOSCOPY WITH FLUORO;  Surgeon: Tanda Rockers, MD;  Location: [facility];  Service: Cardiopulmonary;  Laterality: Bilateral;  . Nephrectomy Left 1995    ?? left side   "alittle  cancer in it--15 yrs ago"  . Cholecystectomy  1995  . Eye surgery Bilateral     cataracts  . Video bronchoscopy with endobronchial navigation N/A 10/31/2013    Procedure: VIDEO BRONCHOSCOPY WITH ENDOBRONCHIAL NAVIGATION;  Surgeon: Collene Gobble, MD;  Location: [place];  Service: Thoracic;  Laterality: N/A;  . Video bronchoscopy with endobronchial ultrasound N/A 10/31/2013    Procedure: VIDEO BRONCHOSCOPY WITH ENDOBRONCHIAL ULTRASOUND;  Surgeon: Collene Gobble, MD;  Location: [place];  Service: Thoracic;  Laterality: N/A;  . Transthoracic echocardiogram  10/29/2013     Mild-moderately reduced EF, 40-45%. Diffuse HK. Mild MR. Mild LA/RA dilation; no determination of elevated pulmonary arterial pressures   . Tee without cardioversion N/A 03/18/2014    Procedure: TRANSESOPHAGEAL ECHOCARDIOGRAM (TEE);  Surgeon: [name], MD;  Location: [facility] ENDOSCOPY;  Service: Cardiovascular;  Laterality: N/A; -- Mild Conc LVH, EF 45-50%, dilated LA no LAA thrombus; normal valves  . Cardioversion N/A 03/18/2014    Procedure: SUCCESSFUL CARDIOVERSION;  Surgeon: [name], MD;  Location: [facility] ENDOSCOPY;  Service: Cardiovascular;  Laterality: N/A;  . Nm myoview ltd  11/2013    No ischemia or infarction   Family History  Problem Relation Age of Onset  . Diabetes Father   . Diabetes Brother   . Diabetes Sister   . Lung cancer Sister   . Liver disease Sister   . Lung cancer Daughter   . Diabetes Daughter   . Hypertension Daughter   . Hyperlipidemia Daughter   . Heart murmur Daughter   . Hyperlipidemia Daughter   . Hypertension Daughter   . Cancer Daughter     lung cancer  . Cancer Sister     lung cancer   History  Substance Use Topics  . Smoking status: Former Smoker -- 1.50 packs/day for 40 years    Types: Cigarettes    Quit date: 12/13/2002  . Smokeless tobacco: Current User    Types: Chew  . Alcohol Use: No    Review of Systems  Respiratory: Positive for cough, shortness of breath and wheezing.   All other systems reviewed and are negative.     Allergies  Review of patient's allergies indicates no known allergies.  Home Medications   Prior to Admission medications   Medication Sig Start Date End Date Taking? Authorizing Provider  Aclidinium Bromide (TUDORZA PRESSAIR) 400 MCG/ACT AEPB Inhale 1 puff into the lungs 2 (two) times daily. One twice daily 02/24/15  Yes Tanda Rockers, MD  albuterol (PROVENTIL HFA;VENTOLIN HFA) 108 (90 BASE) MCG/ACT inhaler Inhale 2 puffs into the lungs every 6 (six) hours as needed for wheezing or shortness of  breath. 01/23/14  Yes Tanda Rockers, MD  budesonide-formoterol [facility]) 160-4.5 MCG/ACT inhaler Inhale 2 puffs into the lungs 2 (two) times daily.     Yes Historical Provider, MD  digoxin (LANOXIN) 0.125 MG tablet Take 1 tablet (0.125 mg total) by mouth daily. 02/18/14  Yes Isaiah Serge, NP  diltiazem (CARDIZEM) 60 MG tablet TAKE ONE TABLET BY MOUTH TWICE DAILY   Yes Leonie Man, MD  doxazosin (CARDURA) 2 MG tablet Take 2 mg by mouth daily.     Yes Historical Provider, MD  metFORMIN (GLUCOPHAGE) 500 MG tablet Take 1,000 mg by mouth 2 (two) times daily with a meal.    Yes Historical Provider, MD  ONE TOUCH ULTRA TEST test strip  02/12/15  Yes Historical Provider, MD  oxyCODONE-acetaminophen (PERCOCET/ROXICET) 5-325 MG per tablet Take 1 tablet by  mouth 2 (two) times daily as needed for severe pain. 04/17/15  Yes Thea Silversmith, MD  simvastatin (ZOCOR) 40 MG tablet Take 0.5 tablets (20 mg total) by mouth at bedtime. 10/29/13  Yes Leonie Man, MD  warfarin (COUMADIN) 5 MG tablet Take 1 to 1.5 tablets by mouth daily as directed Patient taking differently: Take 5 mg on Monday, Tuesday, Wednesday, Thursday, Friday, and Saturday.  Take 2.5 mg on Sunday. 03/22/15  Yes Leonie Man, MD  albuterol (PROVENTIL) (2.5 MG/3ML) 0.083% nebulizer solution Take 3 mLs (2.5 mg total) by nebulization every 6 (six) hours as needed for wheezing or shortness of breath. Patient not taking: Reported on 03/21/2015 11/07/13   Tanda Rockers, MD  levofloxacin (LEVAQUIN) 500 MG tablet Take 1 tablet (500 mg total) by mouth daily. 04/25/15   Varney Biles, MD  predniSONE (DELTASONE) 50 MG tablet Take 1 tablet (50 mg total) by mouth daily. 04/25/15   Madisun Hargrove Kathrynn Humble, MD   BP 119/75 mmHg  Pulse 89  Temp(Src) 97.5 F (36.4 C) (Oral)  Resp 19  SpO2 98% Physical Exam  Constitutional: He is oriented to person, place, and time. He appears well-developed.  HENT:  Head: Normocephalic and atraumatic.  Eyes: Conjunctivae and EOM  are normal. Pupils are equal, round, and reactive to light.  Neck: Normal range of motion. Neck supple. No JVD present.  Cardiovascular: Normal rate and regular rhythm.   Pulmonary/Chest: Effort normal. No respiratory distress. He has wheezes.  Diffuse wheezing, with no resp distress  Abdominal: Soft. Bowel sounds are normal. He exhibits no distension. There is no tenderness. There is no rebound and no guarding.  Musculoskeletal: He exhibits no edema or tenderness.  Neurological: He is alert and oriented to person, place, and time.  Skin: Skin is warm.  Nursing note and vitals reviewed.   ED Course  Procedures (including critical care time) Labs Review Labs Reviewed  BASIC METABOLIC PANEL - Abnormal; Notable for the following:    Chloride 97 (*)    Glucose, Bld 148 (*)    All other components within normal limits  URINALYSIS, ROUTINE W REFLEX MICROSCOPIC - Abnormal; Notable for the following:    APPearance CLOUDY (*)    Leukocytes, UA LARGE (*)    All other components within normal limits  DIGOXIN LEVEL - Abnormal; Notable for the following:    Digoxin Level 0.4 (*)    All other components within normal limits  APTT - Abnormal; Notable for the following:    aPTT 40 (*)    All other components within normal limits  PROTIME-INR - Abnormal; Notable for the following:    Prothrombin Time 21.2 (*)    INR 1.82 (*)    All other components within normal limits  URINE CULTURE  CBC  BRAIN NATRIURETIC PEPTIDE  URINE MICROSCOPIC-ADD ON  I-STAT TROPOININ, ED    Imaging Review Dg Chest 2 View (if Patient Has Fever And/or Copd)  04/25/2015   CLINICAL DATA:  Acute onset of shortness of breath and wheezing. Initial encounter.  EXAM: CHEST  2 VIEW  COMPARISON:  Chest radiograph performed 01/01/2014, and CT of the chest performed 09/09/2014  FINDINGS: The lungs are well-aerated. Postradiation fibrotic change at the right lung apex is relatively stable from the prior CT, without definite  evidence of enlarging mass. Underlying chronic peribronchial thickening is noted on the right. Bibasilar density reflects overlying soft tissues. No superimposed focal airspace consolidation is seen. No pleural effusion or pneumothorax is identified.  The heart  is normal in size; the mediastinal contour is within normal limits. No acute osseous abnormalities are seen.  IMPRESSION: Relatively stable appearance to postradiation fibrotic change at the right lung apex. Underlying chronic peribronchial thickening seen on the right. No superimposed airspace consolidation seen.   Electronically Signed   By: Garald Balding M.D.   On: 04/25/2015 19:56     EKG Interpretation   Date/Time:  Friday Apr 25 2015 18:38:30 EDT Ventricular Rate:  79 PR Interval:  167 QRS Duration: 83 QT Interval:  369 QTC Calculation: 423 R Axis:   80 Text Interpretation:  Sinus rhythm no new changes Confirmed by Kathrynn Humble,  MD, Thelma Comp [phone]) on 04/25/2015 7:52:26 PM      MDM   Final diagnoses:  COPD with exacerbation  UTI (lower urinary tract infection)    Pt comes in with cc of L sided chest pain. Chest pain is atypical, and certainly not cardiac type - it is intermittent, not exertional, and is in the lower L thoracic region. Pt has some wheezing on the exam. The pain is not pleuritic. There is no evidence of DVT on exam, and pt is on coumadin, which makes my pretest probability for PE even lower. CXR here is clear, ambulatory pulsox - 95%, and pt reports no dib. Pt has some WBCs in the urine - on repeat questioning he states that he has some polyuria, no abd pain.  Sat down with patient, and we decided to treat his as COPD exacerbation and uti with levaquin and prednisone - and for him to see his PCP in 1 week. If pt is not better, or if dib is getting worse, than CT PE workup can be undertaken.   Varney Biles, MD 04/25/15 2352

## 2015-04-25 NOTE — ED Notes (Signed)
Bed: WA04 Expected date:  Expected time:  Means of arrival:  Comments: Triage 3  

## 2015-04-27 LAB — URINE CULTURE: Special Requests: NORMAL

## 2015-04-29 ENCOUNTER — Telehealth (HOSPITAL_COMMUNITY): Payer: Self-pay

## 2015-04-29 NOTE — ED Notes (Signed)
Post ED Visit - Positive Culture Follow-up  Culture report reviewed by antimicrobial stewardship pharmacist: '[]'$  Wes [place], Pharm.D., BCPS '[]'$  Heide Guile, Pharm.D., BCPS '[]'$  Alycia Rossetti, Pharm.D., BCPS '[]'$  [place], Pharm.D., BCPS, AAHIVP '[]'$  Legrand Como, Pharm.D., BCPS, AAHIVP '[]'$  Isac Sarna, Pharm.D., BCPS Tegan Magsam, Pharm D  Positive urine culture Treated with levofloxacin, organism sensitive to the same and no further patient follow-up is required at this time.  Ileene Musa 04/29/2015, 11:54 AM

## 2015-05-15 ENCOUNTER — Other Ambulatory Visit: Payer: Self-pay | Admitting: Cardiology

## 2015-05-15 ENCOUNTER — Other Ambulatory Visit: Payer: Self-pay | Admitting: Radiation Oncology

## 2015-05-15 MED ORDER — OXYCODONE-ACETAMINOPHEN 5-325 MG PO TABS: 1.0000 | ORAL_TABLET | Freq: Two times a day (BID) | ORAL | Status: DC | PRN

## 2015-05-22 ENCOUNTER — Ambulatory Visit (INDEPENDENT_AMBULATORY_CARE_PROVIDER_SITE_OTHER): Payer: Medicare Other | Admitting: Pharmacist Clinician (PhC)/ Clinical Pharmacy Specialist

## 2015-05-22 DIAGNOSIS — I4891 Unspecified atrial fibrillation: Secondary | ICD-10-CM

## 2015-05-22 DIAGNOSIS — Z7901 Long term (current) use of anticoagulants: Secondary | ICD-10-CM

## 2015-05-22 LAB — POCT INR: INR: 2.9

## 2015-06-09 ENCOUNTER — Other Ambulatory Visit: Payer: Self-pay

## 2015-06-17 ENCOUNTER — Other Ambulatory Visit: Payer: Self-pay | Admitting: Radiation Oncology

## 2015-06-17 MED ORDER — OXYCODONE-ACETAMINOPHEN 5-325 MG PO TABS: 1.0000 | ORAL_TABLET | Freq: Two times a day (BID) | ORAL | Status: DC | PRN

## 2015-06-19 ENCOUNTER — Other Ambulatory Visit: Payer: Self-pay | Admitting: Cardiology

## 2015-06-20 ENCOUNTER — Ambulatory Visit (INDEPENDENT_AMBULATORY_CARE_PROVIDER_SITE_OTHER): Payer: Medicare Other | Admitting: Pharmacist Clinician (PhC)/ Clinical Pharmacy Specialist

## 2015-06-20 DIAGNOSIS — Z7901 Long term (current) use of anticoagulants: Secondary | ICD-10-CM

## 2015-06-20 DIAGNOSIS — I4891 Unspecified atrial fibrillation: Secondary | ICD-10-CM

## 2015-06-20 LAB — POCT INR: INR: 3.1

## 2015-06-25 NOTE — Progress Notes (Signed)
   Department of Radiation Oncology  Phone:  [phone] Fax:        [phone]   Name: Brent Wade MRN: [id number]  DOB: 11/11/1941  Date: 06/26/2015  Follow Up Visit Note  Diagnosis:    ICD-9-CM ICD-10-CM   1. 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 SBRT to 54 gy in 3 fractions to the Right upper lobe lesion with 70 Gy to the anterior right upper lobe lesion given conventionally.  Interval History: Brent Wade presents today for routine followup. He states he is feeling sad and depressed. He has little to no appetite and does not feel like going outside or working on his cars.  He would like to enroll in hospice.  His breathing symptoms are stable but still limit his mobility.  He is able to walk around the house but is not able to drive and cannot walk long distances without stopping.  He has no hemoptysis. His right chest wall pain is no longer controlled with percocet and he requests something stronger.  He is accompanied by his daughter. He mostly stays at home and is having more trouble with his medications.  He requests hospice services, he says, so that he can have someone come to the house and help him with his medications and draw his labs so he doesn't have to go out of the house. He misses his wife.   Physical Exam: Flat to sad affect. Looking down. Clear breath sounds bilaterally. Point tenderness along the right rib cage. Weight is down 4 pounds since last visit 3 months ago.  Wt Readings from Last 3 Encounters:  06/26/15 162 lb (73.483 kg)  03/21/15 166 lb (75.297 kg)  01/01/15 166 lb 9.6 oz (75.569 kg)    Filed Vitals:   06/26/15 1419  BP: 123/72  Pulse: 87  Temp: 97.8 F (36.6 C)  Resp: 20  Weight: 162 lb (73.483 kg)  SpO2: 97%     IMPRESSION: Brent Wade is a 74 y.o. male s/p radiation to 2 lesions in the right lung with progressive hilar/mediastinal adenopathy, currently  on observation with symptoms of depression and COPD  PLAN:  I talked to Tom and his daughter. His breathing symptoms seem to be stable so Im not sure he is actually a hospice candidate at this time.  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  I have ordered home PT evaluation and home health for medication assistance.  I also increased his pain medications to 5-10 mg every 6 hours as needed. I will see him back in a month to evaluate how things are going.   This document serves as a record of services personally performed by Thea Silversmith, MD. It was created on her behalf by Darcus Austin, a trained medical scribe. The creation of this record is based on the scribe's personal observations and the provider's statements to them. This document has been checked and approved by the attending provider.   Thea Silversmith, MD

## 2015-06-26 ENCOUNTER — Ambulatory Visit
Admission: RE | Admit: 2015-06-26 | Discharge: 2015-06-26 | Disposition: A | Discharge status: Home / Self Care | Payer: Medicare Other | Source: Ambulatory Visit | Attending: Radiation Oncology | Admitting: Radiation Oncology

## 2015-06-26 VITALS — BP 123/72 | HR 87 | Temp 97.8°F | Resp 20 | Wt 162.0 lb

## 2015-06-26 DIAGNOSIS — C3491 Malignant neoplasm of unspecified part of right bronchus or lung: Secondary | ICD-10-CM

## 2015-06-26 MED ORDER — MIRTAZAPINE 15 MG PO TABS: 15.0000 mg | ORAL_TABLET | Freq: Every day | ORAL | Status: DC

## 2015-06-26 MED ORDER — OXYCODONE HCL 5 MG PO TABS: ORAL_TABLET | ORAL | Status: DC

## 2015-06-26 NOTE — Progress Notes (Signed)
Wt Readings from Last 3 Encounters:  06/26/15 162 lb (73.483 kg)  03/21/15 166 lb (75.297 kg)  01/01/15 166 lb 9.6 oz (75.569 kg)   BP 123/72 mmHg  Pulse 87  Temp(Src) 97.8 F (36.6 C)  Resp 20  Wt 162 lb (73.483 kg)  SpO2 97%  Routine follow up SBRT right upper lobe  70 Gy 01/24/14.Continued right rib-cage pain."5' states pain medication just edge off.Patient has requested medication for depression and has agreed to hospice referral.

## 2015-06-29 IMAGING — CR DG CHEST 2V
2 series · 2 of 2 positions shown · non-contrast
Comparison: 10/31/2013

CLINICAL DATA: Shortness of breath

EXAM:
CHEST  2 VIEW

[w chest pa]
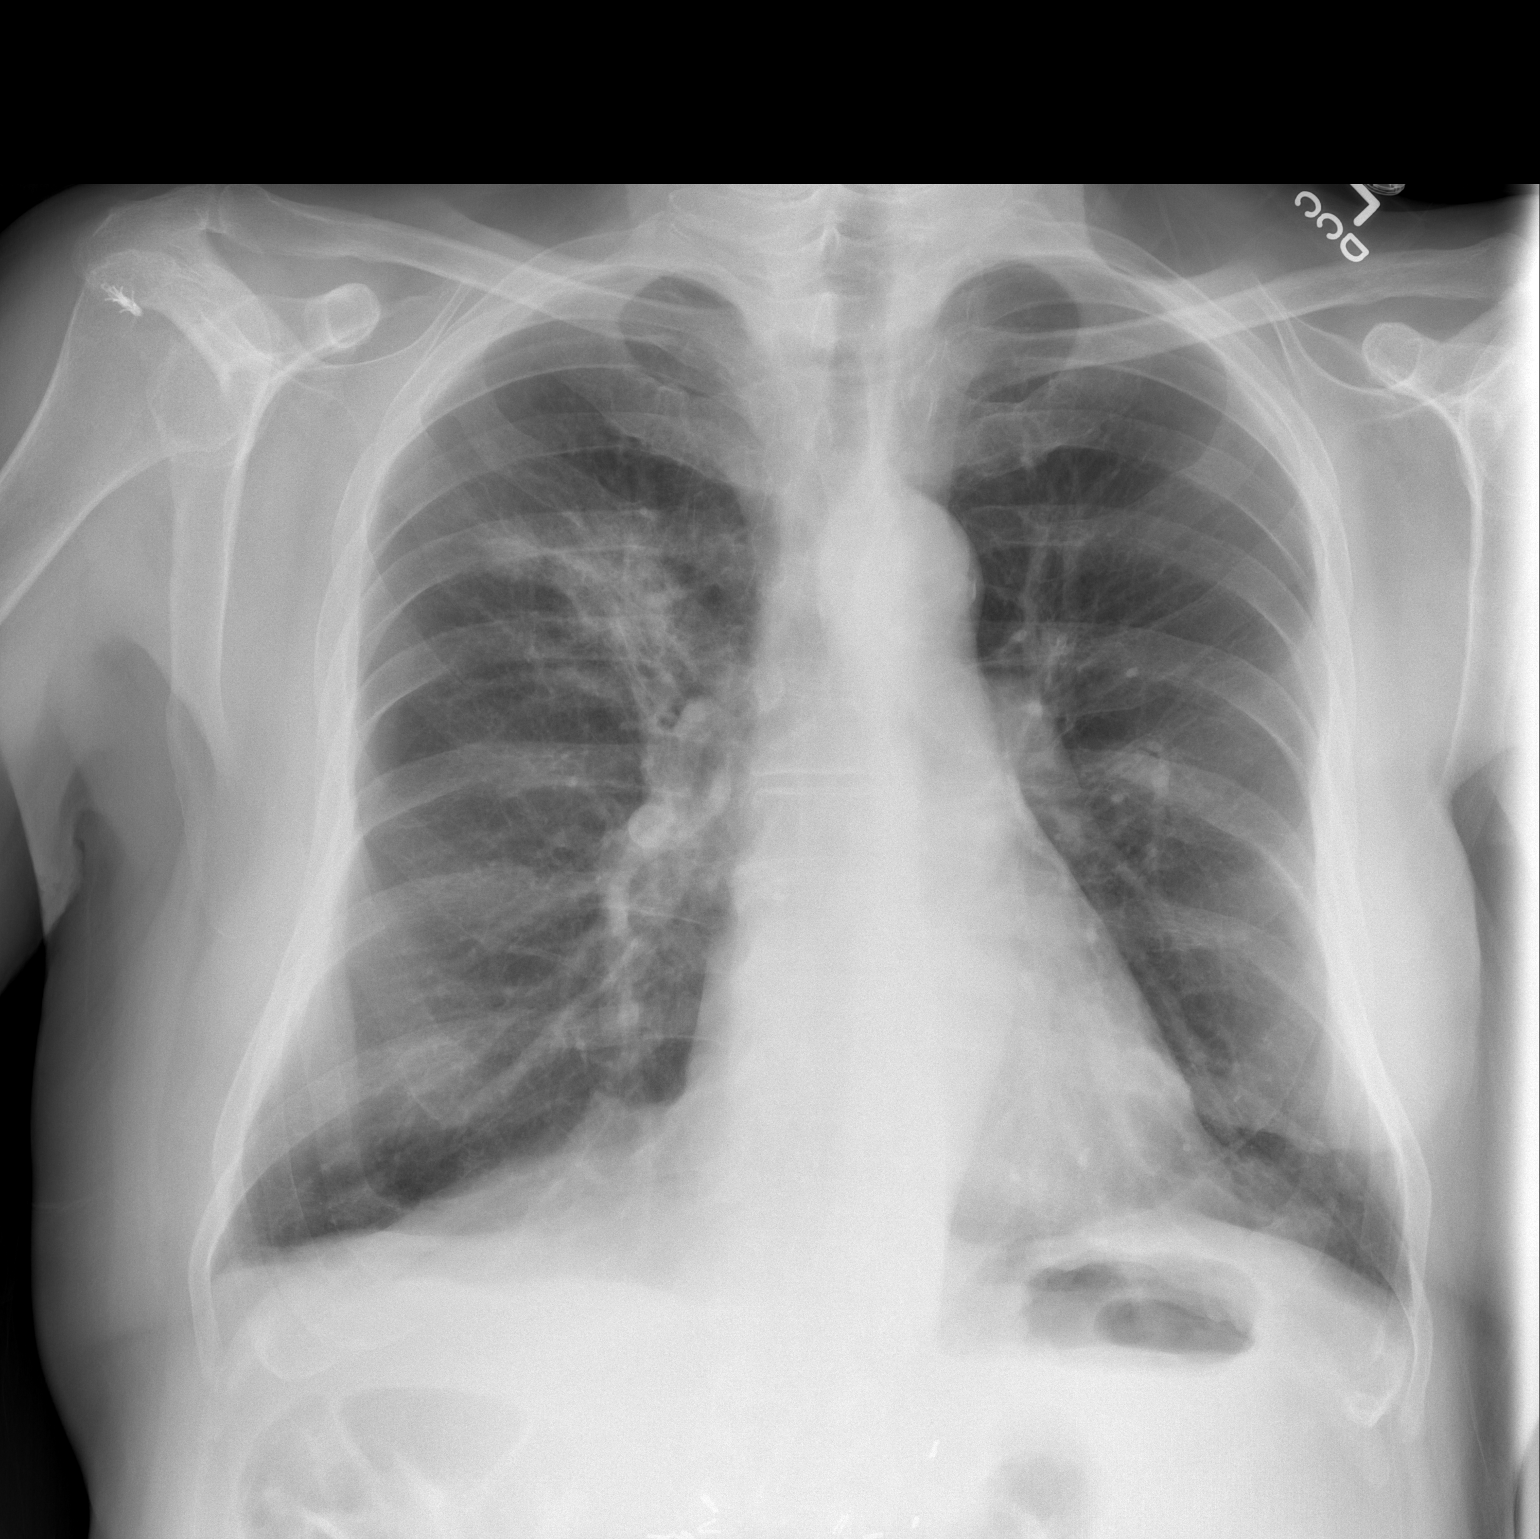

[w chest lat]
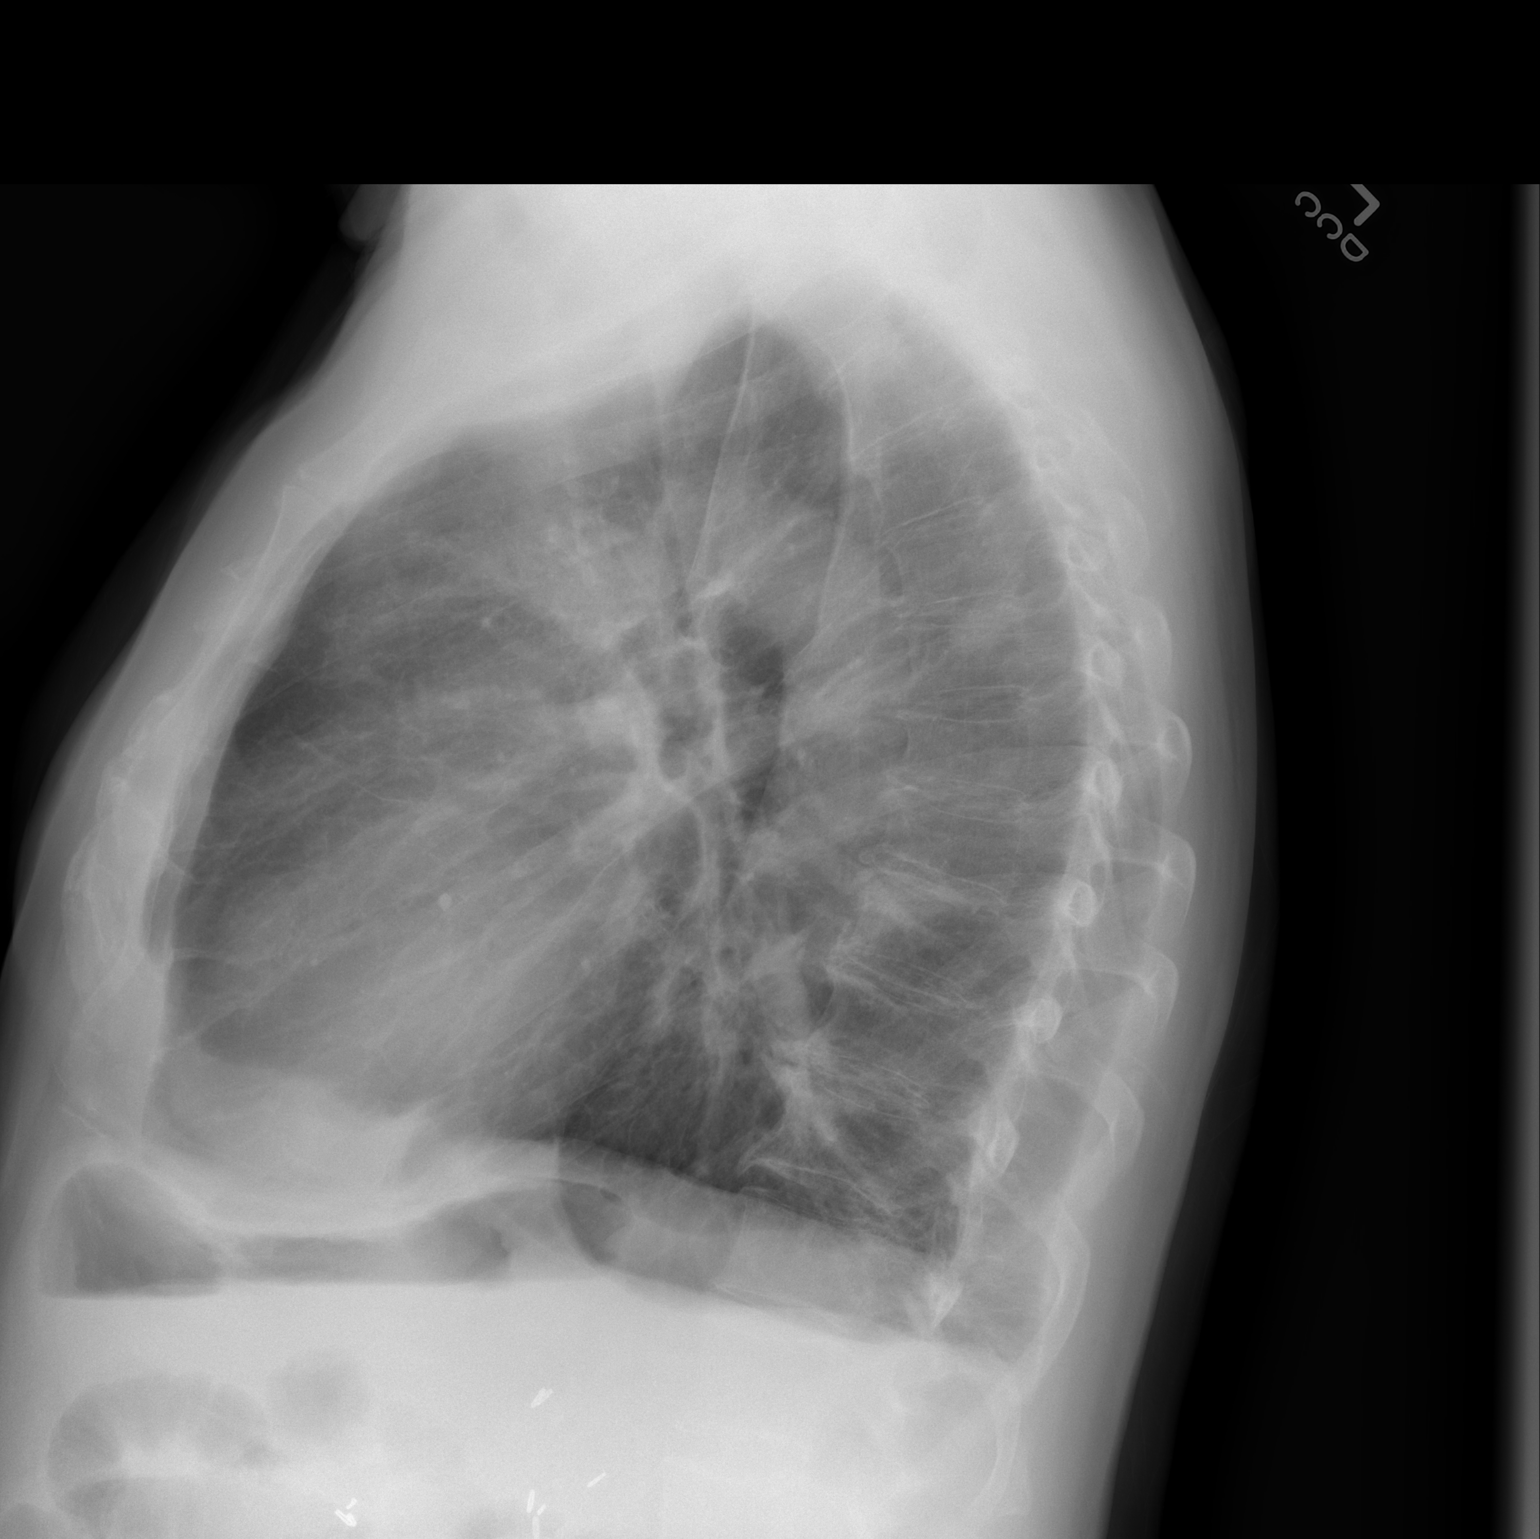

[2 of 2 positions shown; findings below may reference images not displayed]

FINDINGS: Cardiac shadow is stable. Persistent masses are noted in the right
upper lobe similar to that seen on prior CT and plain film
examination. No new focal infiltrate or sizable effusion is seen. No
acute bony abnormality is noted.
IMPRESSION: Persistent right upper lobe masses.

## 2015-07-01 ENCOUNTER — Telehealth: Payer: Self-pay | Admitting: Cardiology

## 2015-07-01 ENCOUNTER — Other Ambulatory Visit: Payer: Self-pay | Admitting: Cardiology

## 2015-07-01 NOTE — Telephone Encounter (Signed)
[facility] is calling to get Orders to manage  Brent Wade coumadin . [facility] will be on duty until 4pm after that the orders can  to be faxed to [phone]. Please call if you have any questions   Thanks

## 2015-07-01 NOTE — Telephone Encounter (Signed)
Will forward to the CV[facility] clinic. 

## 2015-07-01 NOTE — Telephone Encounter (Signed)
Left orders with DJ for pt to have INR on July 27.  Currently with [facility] for COPD related issues.

## 2015-07-01 NOTE — Telephone Encounter (Signed)
Rx(s) sent to pharmacy electronically.  

## 2015-07-07 ENCOUNTER — Telehealth: Payer: Self-pay | Admitting: *Deleted

## 2015-07-07 NOTE — Telephone Encounter (Signed)
FAXED -SIGNED ORDERS FOR  PT/INR DRAW [phone]

## 2015-07-08 ENCOUNTER — Encounter: Payer: Self-pay | Admitting: *Deleted

## 2015-07-08 NOTE — Progress Notes (Signed)
[facility] [facility]  Clinical Social Work was referred by Pension scheme manager for assessment of psychosocial needs.  Clinical Social Worker contacted patient by phone to offer support and assess for needs.  Mr. Harari shared he feels he's "doing much better".  He stated the Home health team came to see him, but was "unable to do much because I'm already so active".  Mr. Groleau reported since taking remeron, he has been much more active and "has been in a better mood".  The patient shared he's been washing dishes, walking around, and overall feels he's in a better mood.  He shared there have been "good days and bad days".  CSW validated patients feelings and discussed symptoms of depression with patient.  He shared he socializes at church and has friends visit him at his home.  The patient also has children in the area that are there to support him as needed.  CSW strongly encouraged patient if he would like further support or counseling resources or patient can share any concerns with radiation oncologist at next visit.   Polo Riley, MSW, LCSW, OSW-C Clinical Social Worker [facility] [phone]

## 2015-07-09 ENCOUNTER — Ambulatory Visit (INDEPENDENT_AMBULATORY_CARE_PROVIDER_SITE_OTHER): Payer: Medicare Other | Admitting: Pharmacist Clinician (PhC)/ Clinical Pharmacy Specialist

## 2015-07-09 DIAGNOSIS — Z7901 Long term (current) use of anticoagulants: Secondary | ICD-10-CM

## 2015-07-09 LAB — POCT INR: INR: 1.7

## 2015-07-11 ENCOUNTER — Ambulatory Visit: Payer: Medicare Other | Admitting: Pharmacist Clinician (PhC)/ Clinical Pharmacy Specialist

## 2015-07-16 ENCOUNTER — Ambulatory Visit (INDEPENDENT_AMBULATORY_CARE_PROVIDER_SITE_OTHER): Payer: Medicare Other | Admitting: Pharmacist

## 2015-07-16 DIAGNOSIS — Z7901 Long term (current) use of anticoagulants: Secondary | ICD-10-CM

## 2015-07-16 LAB — POCT INR: INR: 1.5

## 2015-07-18 ENCOUNTER — Telehealth: Payer: Self-pay | Admitting: *Deleted

## 2015-07-18 NOTE — Telephone Encounter (Signed)
Faxed signed #order [phone] skilled nursing for PT/INR 07/09/15

## 2015-07-25 ENCOUNTER — Telehealth: Payer: Self-pay | Admitting: Cardiology

## 2015-07-25 ENCOUNTER — Telehealth: Payer: Self-pay | Admitting: Pharmacist Clinician (PhC)/ Clinical Pharmacy Specialist

## 2015-07-25 ENCOUNTER — Ambulatory Visit (INDEPENDENT_AMBULATORY_CARE_PROVIDER_SITE_OTHER): Payer: Medicare Other | Admitting: Pharmacist Clinician (PhC)/ Clinical Pharmacy Specialist

## 2015-07-25 DIAGNOSIS — Z7901 Long term (current) use of anticoagulants: Secondary | ICD-10-CM

## 2015-07-25 LAB — POCT INR: INR: 2

## 2015-07-25 NOTE — Telephone Encounter (Signed)
Needs to speak with a nurse regarding future orders for this patient.

## 2015-07-25 NOTE — Telephone Encounter (Signed)
See anticoag note

## 2015-07-25 NOTE — Telephone Encounter (Signed)
INR 2.0----please call with instructions.

## 2015-07-25 NOTE — Telephone Encounter (Signed)
Left msg for RN to return call.

## 2015-07-25 NOTE — Telephone Encounter (Signed)
Almyra Free is calling Ovid Curd back

## 2015-07-25 NOTE — Telephone Encounter (Signed)
Spoke with Brent Wade, she can draw INR 1 more time on Aug 25, then he will have to be discharged from [facility].  Pt understands

## 2015-07-25 NOTE — Telephone Encounter (Signed)
Spoke to [place]. She states patient wants to end home health services with exception of PT INR checks weekly.  She states her understanding is that they would be unable to provide single service and he would have to resume office visits for these checks.  She is going to pt's home today to do lab draw - will see what patient wants to do then & give [place] notice if and when termination of home health services would occur.  Will route to [place] to address arrangements for clinic or lab visits for PT INR.

## 2015-08-01 ENCOUNTER — Ambulatory Visit: Payer: Medicare Other | Admitting: Radiation Oncology

## 2015-08-07 ENCOUNTER — Ambulatory Visit
Admission: RE | Admit: 2015-08-07 | Discharge: 2015-08-07 | Disposition: A | Discharge status: Home / Self Care | Payer: Medicare Other | Source: Ambulatory Visit | Attending: Radiation Oncology | Admitting: Radiation Oncology

## 2015-08-07 VITALS — BP 126/73 | HR 80 | Temp 98.4°F | Resp 20 | Wt 172.8 lb

## 2015-08-07 DIAGNOSIS — C3491 Malignant neoplasm of unspecified part of right bronchus or lung: Secondary | ICD-10-CM

## 2015-08-07 MED ORDER — MIRTAZAPINE 15 MG PO TABS: 15.0000 mg | ORAL_TABLET | Freq: Every day | ORAL | Status: DC

## 2015-08-07 MED ORDER — OXYCODONE HCL 5 MG PO TABS: ORAL_TABLET | ORAL | Status: DC

## 2015-08-07 NOTE — Progress Notes (Signed)
   Department of Radiation Oncology  Phone:  [phone] Fax:        [phone]   Name: Brent Wade MRN: [id number]  DOB: 1941/07/10  Date: 08/07/2015  Follow Up Visit Note  Diagnosis:  No diagnosis found. Summary and Interval since last radiation: 12/25/13-01/24/14. SBRT to 54 gy in 3 fractions to the right upper lobe lesion with 70 Gy to the anterior right upper lobe lesion given conventionally.  Interval History: Brent Wade presents today for routine followup. 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 He is also driving again. He is still taking "the little" pill for pain management. He denies hemoptysis or worsening shortness of breath. He needs a refill on his pain medications and on his remeron.  He feels the remeron is working but could work better. He is feeling so well he is canceling home health.   Physical Exam: Pleasant elderly male in no distress. Appears brighter. Normal respiratory effort. Weight is up 10 pounds since the last visit 1 month ago.  Wt Readings from Last 3 Encounters:  08/07/15 172 lb 12.8 oz (78.382 kg)  06/26/15 162 lb (73.483 kg)  03/21/15 166 lb (75.297 kg)    Filed Vitals:   08/07/15 1527  BP: 126/73  Pulse: 80  Temp: 98.4 F (36.9 C)  Resp: 20  Weight: 172 lb 12.8 oz (78.382 kg)  SpO2: 97%     IMPRESSION: Brent Wade is a 74 y.o. male s/p radiation to 2 lesions in the right lung with progressive hilar/mediastinal adenopathy, currently on observation with symptoms of depression and COPD, much better on Remeron.   PLAN: I ordered 90 tablets of Oxy IR. I told the pt to double his Remeron at night to 30 mg and I have called in refills for this. Marland Kitchen He will f/u with me in 3 months.   This document serves as a record of services personally performed by Thea Silversmith, MD. It was created on her behalf by Darcus Austin, a trained medical scribe. The creation of this record is based on the scribe's personal  observations and the provider's statements to them. This document has been checked and approved by the attending provider.   Thea Silversmith, MD

## 2015-08-08 ENCOUNTER — Ambulatory Visit (INDEPENDENT_AMBULATORY_CARE_PROVIDER_SITE_OTHER): Payer: Medicare Other | Admitting: Pharmacist Clinician (PhC)/ Clinical Pharmacy Specialist

## 2015-08-08 DIAGNOSIS — Z7901 Long term (current) use of anticoagulants: Secondary | ICD-10-CM

## 2015-08-08 LAB — POCT INR: INR: 1.7

## 2015-08-27 ENCOUNTER — Other Ambulatory Visit: Payer: Self-pay | Admitting: Cardiology

## 2015-09-03 ENCOUNTER — Ambulatory Visit: Payer: Medicare Other | Admitting: Pharmacist Clinician (PhC)/ Clinical Pharmacy Specialist

## 2015-09-04 ENCOUNTER — Ambulatory Visit (INDEPENDENT_AMBULATORY_CARE_PROVIDER_SITE_OTHER): Payer: Medicare Other | Admitting: Pharmacist Clinician (PhC)/ Clinical Pharmacy Specialist

## 2015-09-04 DIAGNOSIS — Z7901 Long term (current) use of anticoagulants: Secondary | ICD-10-CM

## 2015-09-04 DIAGNOSIS — I4891 Unspecified atrial fibrillation: Secondary | ICD-10-CM

## 2015-09-04 LAB — POCT INR: INR: 2.1

## 2015-09-19 ENCOUNTER — Other Ambulatory Visit: Payer: Self-pay | Admitting: Radiation Oncology

## 2015-09-19 DIAGNOSIS — C3490 Malignant neoplasm of unspecified part of unspecified bronchus or lung: Secondary | ICD-10-CM

## 2015-09-19 MED ORDER — OXYCODONE HCL 5 MG PO TABS: ORAL_TABLET | ORAL | Status: DC

## 2015-10-01 ENCOUNTER — Ambulatory Visit (INDEPENDENT_AMBULATORY_CARE_PROVIDER_SITE_OTHER): Payer: Medicare Other | Admitting: Pharmacist Clinician (PhC)/ Clinical Pharmacy Specialist

## 2015-10-01 DIAGNOSIS — Z7901 Long term (current) use of anticoagulants: Secondary | ICD-10-CM | POA: Diagnosis not present

## 2015-10-01 DIAGNOSIS — I4891 Unspecified atrial fibrillation: Secondary | ICD-10-CM

## 2015-10-01 LAB — POCT INR: INR: 1.5

## 2015-10-23 ENCOUNTER — Ambulatory Visit
Admission: RE | Admit: 2015-10-23 | Discharge: 2015-10-23 | Disposition: A | Discharge status: Home / Self Care | Payer: Medicare Other | Source: Ambulatory Visit | Attending: Radiation Oncology | Admitting: Radiation Oncology

## 2015-10-23 ENCOUNTER — Telehealth: Payer: Self-pay | Admitting: *Deleted

## 2015-10-23 ENCOUNTER — Ambulatory Visit (INDEPENDENT_AMBULATORY_CARE_PROVIDER_SITE_OTHER): Payer: Medicare Other | Admitting: Pharmacist Clinician (PhC)/ Clinical Pharmacy Specialist

## 2015-10-23 VITALS — BP 133/74 | HR 78 | Temp 97.7°F | Resp 22 | Wt 169.0 lb

## 2015-10-23 DIAGNOSIS — I4891 Unspecified atrial fibrillation: Secondary | ICD-10-CM | POA: Diagnosis not present

## 2015-10-23 DIAGNOSIS — Z7901 Long term (current) use of anticoagulants: Secondary | ICD-10-CM

## 2015-10-23 DIAGNOSIS — C3490 Malignant neoplasm of unspecified part of unspecified bronchus or lung: Secondary | ICD-10-CM

## 2015-10-23 LAB — POCT INR: INR: 1.6

## 2015-10-23 MED ORDER — MIRTAZAPINE 30 MG PO TABS: 30.0000 mg | ORAL_TABLET | Freq: Every day | ORAL | Status: DC

## 2015-10-23 MED ORDER — MIRTAZAPINE 15 MG PO TABS: 15.0000 mg | ORAL_TABLET | Freq: Every day | ORAL | Status: DC

## 2015-10-23 MED ORDER — OXYCODONE HCL 5 MG PO TABS: ORAL_TABLET | ORAL | Status: DC

## 2015-10-23 NOTE — Progress Notes (Signed)
    Department of Radiation Oncology  Phone:  [phone] Fax:        [phone]   Name: Brent Wade MRN: [id number]  DOB: 1941-05-03  Date: 10/23/2015  Follow Up Visit Note  Diagnosis: Adenocarcinoma of the right lung. No diagnosis found.   Summary and Interval since last radiation: 12/25/13-01/24/14. SBRT to 54 gy in 3 fractions to the right upper lobe lesion with 70 Gy to the anterior right upper lobe lesion given conventionally.  Interval History: Brent Wade presents today for routine followup. 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 He is also driving again. He denies hemoptysis or worsening shortness of breath. He needs a refill on his pain medications and on his Remeron.  He feels the Remeron is working but could work better.  Physical Exam: Pleasant elderly male in no distress. Appears brighter. Normal respiratory effort. Wt Readings from Last 3 Encounters:  10/23/15 169 lb (76.658 kg)  08/07/15 172 lb 12.8 oz (78.382 kg)  06/26/15 162 lb (73.483 kg)    Filed Vitals:   10/23/15 1300  BP: 133/74  Pulse: 78  Temp: 97.7 F (36.5 C)  Resp: 22  Weight: 169 lb (76.658 kg)  SpO2: 95%     IMPRESSION: Brent Wade is a 74 y.o. male s/p radiation to 2 lesions in the right lung with progressive hilar/mediastinal adenopathy, currently on observation with symptoms of depression and COPD, much better on Remeron.   PLAN: Refilling his oxycodone and I changed the dose on his Remeron to 30 mg. He will follow up with me in 3 months with a CT of his chest.   Thea Silversmith, MD    This document serves as a record of services personally performed by Thea Silversmith, MD. It was created on her behalf by  Lendon Collar, a trained medical scribe. The creation of this record is based on the scribe's personal observations and the provider's statements to them. This document has been checked and approved by the attending provider.

## 2015-10-23 NOTE — Telephone Encounter (Signed)
CALLED PATIENT TO INFORM OF CT AND FU ,NO ANSWER WILL CALL LATER 

## 2015-10-23 NOTE — Progress Notes (Signed)
NURSING EVALUATION  Radiation Oncology: Dr. Thea Silversmith  20-Month follow-up appt for: Right lung adenocarcinoma    Last radiation: 01/24/14-SBRT to 54 gy in 3 fractions to the right upper lobe lesion with 70 Gy to the anterior right upper lobe lesion given conventionally.  Most recent imaging:  Chest x-ray (04/25/15) IMPRESSION: Relatively stable appearance to postradiation fibrotic change at the right lung apex. Underlying chronic peribronchial thickening seen on the right. No superimposed airspace consolidation seen.  CT chest (09/09/14) IMPRESSION: -Interval obscuration of the 2 right upper lobe pulmonary nodules by a larger area of confluent airspace disease, likely reflecting evolving post radiation fibrosis. The nodules do appear to be decreased, but again, this is difficult to assess given the new encompassing right upper lobe opacity. -Interval development of slightly more prominent soft tissue fullness in the right hilum, difficult to assess given the lack of intravenous contrast material. As the appearance does raise concern for progressive right hilar lymphadenopathy with mass effect on the bronchus intermedius, close followup to this region is recommended. -Stable 6 mm left lower lobe pulmonary nodule on the major fissure. -Stable appearance of ground-glass nodule in the left lower lobe.  Tobacco/Marijuana/Snuff/ETOH use: Using snuff daily (about 1/2 box/day); No ETOH.   Signs/Symptoms  Weight changes, if any: Some weight gain, per patient.   Respiratory complaints, if any: Dyspnea on exertion; productive cough-worse in mornings; gets better after using inhalers. Does not use O2.  Dysphagia, if any: No  Hemoptysis, if any: No  Pain issues, if any: Yes; right chest; requiring about 3 tabs Oxy IR per day to help with pain control.   Mood: Better since starting Remeron. "I can tell a difference if I don't take it."   Vital Signs:  Filed Vitals:   10/23/15 1300   BP: 133/74  Pulse: 78  Temp: 97.7 F (36.5 C)  Resp: 22    Weight:  Filed Weights   10/23/15 1300  Weight: 169 lb (76.658 kg)     Current Complaints / other details:  Mr. Zarrella is here for routine follow-up for lung cancer.  The Remeron is helping his mood and appetite. He needs a refill and says that 30 mg helped better than the 15 mg. Oxycodone is helping; requiring about 3 doses per day. Pain worse with movement.   Pharmacy is: [facility]   Mike Craze, NP 10/23/15

## 2015-10-29 ENCOUNTER — Ambulatory Visit: Payer: Medicare Other | Admitting: Pharmacist Clinician (PhC)/ Clinical Pharmacy Specialist

## 2015-11-11 ENCOUNTER — Telehealth: Payer: Self-pay | Admitting: *Deleted

## 2015-11-11 NOTE — Telephone Encounter (Signed)
CALLED PATIENT TO INFORM OF APPTS. FOR 01-2016, NO ANSWER, MAILED APPT. CARDS

## 2015-11-21 ENCOUNTER — Ambulatory Visit: Payer: Medicare Other | Admitting: Pharmacist Clinician (PhC)/ Clinical Pharmacy Specialist

## 2015-11-26 ENCOUNTER — Telehealth: Payer: Self-pay | Admitting: *Deleted

## 2015-11-26 ENCOUNTER — Ambulatory Visit (INDEPENDENT_AMBULATORY_CARE_PROVIDER_SITE_OTHER): Payer: Medicare Other | Admitting: Pharmacist Clinician (PhC)/ Clinical Pharmacy Specialist

## 2015-11-26 ENCOUNTER — Other Ambulatory Visit: Payer: Self-pay | Admitting: Radiation Oncology

## 2015-11-26 DIAGNOSIS — I4891 Unspecified atrial fibrillation: Secondary | ICD-10-CM

## 2015-11-26 DIAGNOSIS — C3490 Malignant neoplasm of unspecified part of unspecified bronchus or lung: Secondary | ICD-10-CM

## 2015-11-26 DIAGNOSIS — Z7901 Long term (current) use of anticoagulants: Secondary | ICD-10-CM | POA: Diagnosis not present

## 2015-11-26 LAB — POCT INR: INR: 4.5

## 2015-11-26 MED ORDER — OXYCODONE HCL 5 MG PO TABS: ORAL_TABLET | ORAL | Status: DC

## 2015-11-26 NOTE — Telephone Encounter (Signed)
Patient had called and has requested refill on his Oxycodone '5mg'$  (IR), last written 10/23/15(90 tabs, 1 tab q 8hrs prn)  He will be at coumadin clinic around 3 pm today and can come pick up at nursing, I  Will  In basket MD and will call him back with status once Dr. Pablo Ledger back in office this afternoon, patient thanked this RN,waiting on call back 12:35 PM

## 2015-12-17 ENCOUNTER — Ambulatory Visit (INDEPENDENT_AMBULATORY_CARE_PROVIDER_SITE_OTHER): Payer: Medicare Other | Admitting: Pharmacist Clinician (PhC)/ Clinical Pharmacy Specialist

## 2015-12-17 DIAGNOSIS — I4891 Unspecified atrial fibrillation: Secondary | ICD-10-CM | POA: Diagnosis not present

## 2015-12-17 DIAGNOSIS — Z7901 Long term (current) use of anticoagulants: Secondary | ICD-10-CM

## 2015-12-17 LAB — POCT INR: INR: 2.4

## 2015-12-17 MED ORDER — WARFARIN SODIUM 5 MG PO TABS: ORAL_TABLET | ORAL | Status: DC

## 2016-01-01 ENCOUNTER — Encounter: Payer: Self-pay | Admitting: *Deleted

## 2016-01-01 NOTE — Progress Notes (Signed)
I received a call from Mr. Brent Wade requesting a refill on his Oxycodone IR 5 mg.  It was last refilled on 11/26/15.  He reports moderate pain to his right shoulder/neck and radiates down his arm. Notified Dr. Pablo Ledger.

## 2016-01-02 ENCOUNTER — Telehealth: Payer: Self-pay | Admitting: Oncology

## 2016-01-02 ENCOUNTER — Other Ambulatory Visit: Payer: Self-pay | Admitting: Radiation Oncology

## 2016-01-02 DIAGNOSIS — C3490 Malignant neoplasm of unspecified part of unspecified bronchus or lung: Secondary | ICD-10-CM

## 2016-01-02 MED ORDER — OXYCODONE HCL 5 MG PO TABS: ORAL_TABLET | ORAL | Status: DC

## 2016-01-02 NOTE — Telephone Encounter (Addendum)
Called Brent Wade and notified him that his prescription is ready for pick up in the Radiation Nursing office.  He said he will pick it up on Monday.

## 2016-01-14 ENCOUNTER — Ambulatory Visit (INDEPENDENT_AMBULATORY_CARE_PROVIDER_SITE_OTHER): Payer: Medicare Other | Admitting: Pharmacist Clinician (PhC)/ Clinical Pharmacy Specialist

## 2016-01-14 DIAGNOSIS — I4891 Unspecified atrial fibrillation: Secondary | ICD-10-CM | POA: Diagnosis not present

## 2016-01-14 DIAGNOSIS — Z7901 Long term (current) use of anticoagulants: Secondary | ICD-10-CM | POA: Diagnosis not present

## 2016-01-14 LAB — POCT INR: INR: 1.4

## 2016-01-23 ENCOUNTER — Ambulatory Visit (HOSPITAL_COMMUNITY)
Admission: RE | Admit: 2016-01-23 | Discharge: 2016-01-23 | Disposition: A | Discharge status: Home / Self Care | Payer: Medicare Other | Source: Ambulatory Visit | Attending: Radiation Oncology | Admitting: Radiation Oncology

## 2016-01-23 DIAGNOSIS — M4854XA Collapsed vertebra, not elsewhere classified, thoracic region, initial encounter for fracture: Secondary | ICD-10-CM | POA: Insufficient documentation

## 2016-01-23 DIAGNOSIS — J701 Chronic and other pulmonary manifestations due to radiation: Secondary | ICD-10-CM | POA: Diagnosis not present

## 2016-01-23 DIAGNOSIS — Z923 Personal history of irradiation: Secondary | ICD-10-CM | POA: Diagnosis present

## 2016-01-23 DIAGNOSIS — J439 Emphysema, unspecified: Secondary | ICD-10-CM | POA: Diagnosis not present

## 2016-01-23 DIAGNOSIS — R911 Solitary pulmonary nodule: Secondary | ICD-10-CM | POA: Diagnosis not present

## 2016-01-23 DIAGNOSIS — R590 Localized enlarged lymph nodes: Secondary | ICD-10-CM | POA: Diagnosis not present

## 2016-01-23 DIAGNOSIS — C3490 Malignant neoplasm of unspecified part of unspecified bronchus or lung: Secondary | ICD-10-CM | POA: Insufficient documentation

## 2016-01-26 NOTE — Progress Notes (Signed)
   Weight changes, if any: 9 pound  Weight loss since Aug. 2016 Wt Readings from Last 3 Encounters:  01/29/16 163 lb 14.4 oz (74.345 kg)  10/23/15 169 lb (76.658 kg)  08/07/15 172 lb 12.8 oz (78.382 kg)    Respiratory complaints, if any: Shortness of breath most of the time, O2 sat 97%  Hemoptysis, if any: No, does cough sometimes loose mostly occasionally thick secretions  Pain issues, if any: 5/10 Takes Oxycodone for pain control  Swallowing Problems/Pain/Difficulty swallowing:None  Smoking Tobacco/Marijuana/Snuff/ETOH use: Chewing tobacco normally 7-8 times daily BP 132/68 mmHg  Pulse 84  Temp(Src) 97.8 F (36.6 C) (Oral)  Resp 18  Ht '5\' 8"'$  (1.727 m)  Wt 163 lb 14.4 oz (74.345 kg)  BMI 24.93 kg/m2  SpO2 97%

## 2016-01-29 ENCOUNTER — Ambulatory Visit
Admission: RE | Admit: 2016-01-29 | Discharge: 2016-01-29 | Disposition: A | Discharge status: Home / Self Care | Payer: Medicare Other | Source: Ambulatory Visit | Attending: Radiation Oncology | Admitting: Radiation Oncology

## 2016-01-29 ENCOUNTER — Encounter: Payer: Self-pay | Admitting: Radiation Oncology

## 2016-01-29 VITALS — BP 132/68 | HR 84 | Temp 97.8°F | Resp 18 | Ht 68.0 in | Wt 163.9 lb

## 2016-01-29 DIAGNOSIS — C3490 Malignant neoplasm of unspecified part of unspecified bronchus or lung: Secondary | ICD-10-CM

## 2016-01-29 DIAGNOSIS — C3491 Malignant neoplasm of unspecified part of right bronchus or lung: Secondary | ICD-10-CM

## 2016-01-29 MED ORDER — OXYCODONE HCL 5 MG PO TABS: ORAL_TABLET | ORAL | Status: DC

## 2016-01-29 NOTE — Progress Notes (Signed)
   Department of Radiation Oncology  Phone:  [phone] Fax:        [phone]   Name: Brent Wade MRN: [id number]  DOB: 11/07/1941  Date: 01/29/2016  Follow Up Visit Note  Diagnosis: Adenocarcinoma of the right lung.  Summary and Interval since last radiation: 12/25/13-01/24/14. SBRT to 54 gy in 3 fractions to the right upper lobe lesion with 70 Gy to the anterior right upper lobe lesion given conventionally.  Interval History: Brent Wade presents today for a routine follow up. He is still taking Oxycodone occasionally. He denies hemoptysis and shortness of breath. He is on Remeron 15 mg. I ordered a CT of the chest which was performed last week. This shows growth of the right upper lobe nodule, stable fullness in the right hilum, and an increase in his mild subcarinal adenopathy. The right paratracheal adenopathy had resolved. A 100m right lower lobe pulmonary nodule was new and a left lower lobe pulmonary nodule had grown slightly. In addition, he had new T5 and T6 vertebral compression fractures.   Today, Brent Wade having a lot of trouble breathing. He is very short of breath. He reports that he takes inhalers to managing his breathing. He is not using oxygen and not using his nebulizer.   Physical Exam: Pleasant elderly male in no distress. Pursed lip breathing, using accessory muscles.   Wt Readings from Last 3 Encounters:  01/29/16 163 lb 14.4 oz (74.345 kg)  10/23/15 169 lb (76.658 kg)  08/07/15 172 lb 12.8 oz (78.382 kg)    Filed Vitals:   01/29/16 1331  BP: 132/68  Pulse: 84  Temp: 97.8 F (36.6 C)  TempSrc: Oral  Resp: 18  Height: '5\' 8"'$  (1.727 m)  Weight: 163 lb 14.4 oz (74.345 kg)  SpO2: 97%     IMPRESSION: AKaedenis a 75y.o. male s/p radiation to 2 lesions in the right lung with progressive hilar/mediastinal adenopathy, currently on observation with symptoms of depression and COPD.   PLAN: His scans really show very little growth over time. He would  like to continue observation which I think is reasonable. I have refilled his oxycodone and will see him back in 4 months. I do not see the need to repeat a scan as he is really not interested in salvage chemotherapy or radiation. We will follow up with a CT scan in 1 year. I suggested that he take 30 mg of Remeron.   SThea Silversmith MD  This document serves as a record of services personally performed by SThea Silversmith MD. It was created on her behalf by JJenell Milliner a trained medical scribe. The creation of this record is based on the scribe's personal observations and the provider's statements to them. This document has been checked and approved by the attending provider.

## 2016-01-30 ENCOUNTER — Other Ambulatory Visit: Payer: Self-pay | Admitting: Cardiology

## 2016-01-30 NOTE — Telephone Encounter (Signed)
Rx(s) sent to pharmacy electronically.  

## 2016-02-04 ENCOUNTER — Ambulatory Visit
Admission: RE | Admit: 2016-02-04 | Discharge: 2016-02-04 | Disposition: A | Discharge status: Home / Self Care | Payer: Medicare Other | Source: Ambulatory Visit | Attending: Family Medicine | Admitting: Family Medicine

## 2016-02-04 ENCOUNTER — Encounter: Payer: Medicare Other | Admitting: Pharmacist Clinician (PhC)/ Clinical Pharmacy Specialist

## 2016-02-04 ENCOUNTER — Other Ambulatory Visit: Payer: Self-pay | Admitting: Family Medicine

## 2016-02-04 DIAGNOSIS — R0989 Other specified symptoms and signs involving the circulatory and respiratory systems: Secondary | ICD-10-CM

## 2016-02-04 DIAGNOSIS — R0602 Shortness of breath: Secondary | ICD-10-CM

## 2016-02-18 ENCOUNTER — Telehealth: Payer: Self-pay | Admitting: *Deleted

## 2016-02-18 NOTE — Telephone Encounter (Signed)
Called Mr. Panek to let him know to check with [facility] to see when his refill on the Remeron will be ready today.

## 2016-02-26 ENCOUNTER — Ambulatory Visit (INDEPENDENT_AMBULATORY_CARE_PROVIDER_SITE_OTHER): Payer: Medicare Other | Admitting: Pharmacist Clinician (PhC)/ Clinical Pharmacy Specialist

## 2016-02-26 DIAGNOSIS — Z7901 Long term (current) use of anticoagulants: Secondary | ICD-10-CM | POA: Diagnosis not present

## 2016-02-26 DIAGNOSIS — I4891 Unspecified atrial fibrillation: Secondary | ICD-10-CM

## 2016-02-26 LAB — POCT INR: INR: 3.3

## 2016-03-05 ENCOUNTER — Other Ambulatory Visit: Payer: Self-pay | Admitting: Radiation Oncology

## 2016-03-05 ENCOUNTER — Telehealth: Payer: Self-pay | Admitting: *Deleted

## 2016-03-05 DIAGNOSIS — C3490 Malignant neoplasm of unspecified part of unspecified bronchus or lung: Secondary | ICD-10-CM

## 2016-03-05 MED ORDER — OXYCODONE HCL 5 MG PO TABS: ORAL_TABLET | ORAL | Status: DC

## 2016-03-05 NOTE — Telephone Encounter (Signed)
Dr. Pablo Ledger will write another prescription because the first prescription was not signed.  [place] made aware and Brent Wade made aware of the new prescription.  To pick up on Monday when he comes for a doctor appointment.

## 2016-03-08 ENCOUNTER — Telehealth: Payer: Self-pay | Admitting: Internal Medicine

## 2016-03-08 NOTE — Telephone Encounter (Signed)
lmtcb x1 for pt. 

## 2016-03-09 NOTE — Telephone Encounter (Signed)
Pt called back. He reports he is wanting to change providers but Dr. Marisue Humble was the one that was going to decide who he preferred for him to see.

## 2016-03-09 NOTE — Telephone Encounter (Signed)
Please advise Dr Vaughan Browner if you are okay with this [facility] patient switching care over to you. Thanks.

## 2016-03-09 NOTE — Telephone Encounter (Signed)
Fine with me

## 2016-03-09 NOTE — Telephone Encounter (Signed)
Left message for patient to call back  

## 2016-03-09 NOTE — Telephone Encounter (Signed)
Called Jamelle Haring at Dr. Andrew Au office. Patient discussed with Dr. Andrew Au office that he would like to switch pulmonary physicians. Patient advised [place] that he wants Dr. Andrew Au office to decide which provider to switch him too.  Spoke with Beverlee Nims at Dr. Andrew Au office and she states that they would like to have patient see Dr. Filiberto Pinks to switch, Dr. Melvyn Novas? Okay to switch, Dr. Vaughan Browner?

## 2016-03-10 NOTE — Telephone Encounter (Signed)
Checked and appointment was already made with PM. Nothing further was needed at this time.

## 2016-03-10 NOTE — Telephone Encounter (Signed)
Ok with me 

## 2016-03-10 NOTE — Telephone Encounter (Signed)
Pt returned call and I let him know that the switch was ok'd by both dr's and he was ok with that and wanted to make an appointment w/dr Orlin Hilding

## 2016-03-10 NOTE — Telephone Encounter (Signed)
LMOMTCB for pt  

## 2016-03-29 ENCOUNTER — Encounter (HOSPITAL_COMMUNITY): Payer: Self-pay | Admitting: Emergency Medicine

## 2016-03-29 ENCOUNTER — Emergency Department (HOSPITAL_COMMUNITY): Payer: Medicare Other

## 2016-03-29 ENCOUNTER — Emergency Department (HOSPITAL_COMMUNITY)
Admission: EM | Admit: 2016-03-29 | Discharge: 2016-03-30 | Disposition: A | Discharge status: Home / Self Care | Payer: Medicare Other | Attending: Emergency Medicine | Admitting: Emergency Medicine

## 2016-03-29 DIAGNOSIS — Z87891 Personal history of nicotine dependence: Secondary | ICD-10-CM | POA: Insufficient documentation

## 2016-03-29 DIAGNOSIS — M7989 Other specified soft tissue disorders: Secondary | ICD-10-CM | POA: Insufficient documentation

## 2016-03-29 DIAGNOSIS — M19042 Primary osteoarthritis, left hand: Secondary | ICD-10-CM | POA: Diagnosis not present

## 2016-03-29 DIAGNOSIS — Z7901 Long term (current) use of anticoagulants: Secondary | ICD-10-CM | POA: Insufficient documentation

## 2016-03-29 DIAGNOSIS — E785 Hyperlipidemia, unspecified: Secondary | ICD-10-CM | POA: Diagnosis not present

## 2016-03-29 DIAGNOSIS — J441 Chronic obstructive pulmonary disease with (acute) exacerbation: Secondary | ICD-10-CM | POA: Diagnosis not present

## 2016-03-29 DIAGNOSIS — Z7984 Long term (current) use of oral hypoglycemic drugs: Secondary | ICD-10-CM | POA: Diagnosis not present

## 2016-03-29 DIAGNOSIS — R0602 Shortness of breath: Secondary | ICD-10-CM | POA: Diagnosis present

## 2016-03-29 DIAGNOSIS — I10 Essential (primary) hypertension: Secondary | ICD-10-CM | POA: Diagnosis not present

## 2016-03-29 DIAGNOSIS — I4891 Unspecified atrial fibrillation: Secondary | ICD-10-CM | POA: Diagnosis not present

## 2016-03-29 DIAGNOSIS — Z85118 Personal history of other malignant neoplasm of bronchus and lung: Secondary | ICD-10-CM | POA: Insufficient documentation

## 2016-03-29 DIAGNOSIS — M19041 Primary osteoarthritis, right hand: Secondary | ICD-10-CM | POA: Insufficient documentation

## 2016-03-29 DIAGNOSIS — E119 Type 2 diabetes mellitus without complications: Secondary | ICD-10-CM | POA: Insufficient documentation

## 2016-03-29 DIAGNOSIS — Z7951 Long term (current) use of inhaled steroids: Secondary | ICD-10-CM | POA: Insufficient documentation

## 2016-03-29 LAB — BASIC METABOLIC PANEL
ANION GAP: 8 (ref 5–15)
BUN: 17 mg/dL (ref 6–20)
CALCIUM: 8.8 mg/dL — AB (ref 8.9–10.3)
CO2: 30 mmol/L (ref 22–32)
Chloride: 103 mmol/L (ref 101–111)
Creatinine, Ser: 0.92 mg/dL (ref 0.61–1.24)
Glucose, Bld: 160 mg/dL — ABNORMAL HIGH (ref 65–99)
POTASSIUM: 3.8 mmol/L (ref 3.5–5.1)
Sodium: 141 mmol/L (ref 135–145)

## 2016-03-29 LAB — CBC
HEMATOCRIT: 38.9 % — AB (ref 39.0–52.0)
HEMOGLOBIN: 12.8 g/dL — AB (ref 13.0–17.0)
MCH: 30.4 pg (ref 26.0–34.0)
MCHC: 32.9 g/dL (ref 30.0–36.0)
MCV: 92.4 fL (ref 78.0–100.0)
Platelets: 267 10*3/uL (ref 150–400)
RBC: 4.21 MIL/uL — AB (ref 4.22–5.81)
RDW: 14 % (ref 11.5–15.5)
WBC: 8.7 10*3/uL (ref 4.0–10.5)

## 2016-03-29 MED ORDER — ALBUTEROL SULFATE (2.5 MG/3ML) 0.083% IN NEBU
5.0000 mg | INHALATION_SOLUTION | Freq: Once | RESPIRATORY_TRACT | Status: AC
  Administered 2016-03-29: 5 mg via RESPIRATORY_TRACT
  Filled 2016-03-29: qty 6

## 2016-03-29 MED ORDER — IPRATROPIUM-ALBUTEROL 0.5-2.5 (3) MG/3ML IN SOLN
3.0000 mL | Freq: Once | RESPIRATORY_TRACT | Status: AC
  Administered 2016-03-30: 3 mL via RESPIRATORY_TRACT
  Filled 2016-03-29: qty 3

## 2016-03-29 MED ORDER — PREDNISONE 20 MG PO TABS
60.0000 mg | ORAL_TABLET | Freq: Once | ORAL | Status: AC
  Administered 2016-03-30: 60 mg via ORAL
  Filled 2016-03-29: qty 3

## 2016-03-29 NOTE — ED Notes (Signed)
Pt states he has had SOB since Saturday. States inhalers aren't helping. Alert and oriented. HX of COPD.

## 2016-03-29 NOTE — ED Provider Notes (Signed)
CSN: [id number]     Arrival date & time 03/29/16  2204 History  By signing my name below, I, Eustaquio Maize, attest that this documentation has been prepared under the direction and in the presence of Delora Fuel, MD. Electronically Signed: Eustaquio Maize, ED Scribe. 03/29/2016. 11:48 PM.   Chief Complaint  Patient presents with  . Shortness of Breath   The history is provided by the patient and a relative. No language interpreter was used.     HPI Comments: Brent Wade is a 75 y.o. male with PMHx HTN, HLD, DM, PAF who presents to the Emergency Department complaining of gradual onset, constant, nasal congestion and productive cough with yellow sputum x 3 days. Pt also complains of shortness of breath and bilateral ankle swelling. Family mentions that pt has been having wheezing as well. Pt does have an albuterol inhaler which he has been using without much relief. Denies fever, chills, sweats, chest pain, chest heaviness, chest pressure, or any other associated symptoms.    Past Medical History  Diagnosis Date  . Hyperlipidemia   . Type II or unspecified type diabetes mellitus without mention of complication, not stated as uncontrolled     twice/week  . Chronic airway obstruction, not elsewhere classified     HFA 25-50% 07-16-2010> 100% 01-28-2011; PFT's 08-13-10 FEV1 .93(34%) ratio 37 with 23% better p B2 DLCO 69%  . Shortness of breath   . Hypertension   . Arthritis     in hands  . PAF (paroxysmal atrial fibrillation) [facility]) Nov 2014    Atrial Fibrillation; takes Cardizem, Digoxi, on Warfarin;; TEE DCCV 03/18/2014   . Lung cancer ([place]) 11/06/2013    Diagnosed during bronchoscopy  . Radiation 12/25/13-01/24/14    Right upper lobe lung  . 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 slt progression 11/16/2012 > def progression 07/03/2013 - CT chest 08/16/13 1. Aggressive  appearing right upper lobe pulmonary mass and nodule  with a right hilar lymphadenopathy, as detailed above. This is  favored to represent a primary bronchogenic neoplasm with nodal  metastasis to the right hilum and satellite nodule in the same lobe  (i.e., T3, N1, Mx disease), however, this could alternatively  represent synchronous right upper lobe neoplasms, or less likely  an aggressive atypical infectious process such as a fungal  pneumonia (not strongly favored). Clinical correlation is strongly  recommended, with consideration for PET CT and/or biopsy at this  time.  2. In addition, there is a nonspecific 1.7 x 1.4 cm ground-glass  attenuation nodule in the left lower lobe which warrants attention  on follow-up studies - fob 08/21/13 > extrinsic compression of BI/ wang and tbbx >    Past Surgical History  Procedure Laterality Date  . Video bronchoscopy Bilateral 08/21/2013    Procedure: VIDEO BRONCHOSCOPY WITH FLUORO;  Surgeon: Tanda Rockers, MD;  Location: [facility] ENDOSCOPY;  Service: Cardiopulmonary;  Laterality: Bilateral;  . 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 Cholecystectomy  1995  . Eye surgery Bilateral     cataracts  . Video bronchoscopy with endobronchial navigation N/A 10/31/2013    Procedure: VIDEO BRONCHOSCOPY WITH ENDOBRONCHIAL NAVIGATION;  Surgeon: Collene Gobble, MD;  Location: [place];  Service: Thoracic;  Laterality: N/A;  . Video bronchoscopy with endobronchial ultrasound N/A 10/31/2013  Procedure: VIDEO BRONCHOSCOPY WITH ENDOBRONCHIAL ULTRASOUND;  Surgeon: Collene Gobble, MD;  Location: [facility] OR;  Service: Thoracic;  Laterality: N/A;  . Transthoracic echocardiogram  10/29/2013    Mild-moderately reduced EF, 40-45%. Diffuse HK. Mild MR. Mild LA/RA dilation; no determination of elevated pulmonary arterial pressures   . Tee without cardioversion N/A 03/18/2014    Procedure: TRANSESOPHAGEAL ECHOCARDIOGRAM (TEE);  Surgeon: [name], MD;   Location: [facility] ENDOSCOPY;  Service: Cardiovascular;  Laterality: N/A; -- Mild Conc LVH, EF 45-50%, dilated LA no LAA thrombus; normal valves  . Cardioversion N/A 03/18/2014    Procedure: SUCCESSFUL CARDIOVERSION;  Surgeon: [name], MD;  Location: [facility] ENDOSCOPY;  Service: Cardiovascular;  Laterality: N/A;  . Nm myoview ltd  11/2013    No ischemia or infarction   Family History  Problem Relation Age of Onset  . Diabetes Father   . Diabetes Brother   . Diabetes Sister   . Lung cancer Sister   . Liver disease Sister   . Lung cancer Daughter   . Diabetes Daughter   . Hypertension Daughter   . Hyperlipidemia Daughter   . Heart murmur Daughter   . Hyperlipidemia Daughter   . Hypertension Daughter   . Cancer Daughter     lung cancer  . Cancer Sister     lung cancer   Social History  Substance Use Topics  . Smoking status: Former Smoker -- 1.50 packs/day for 40 years    Types: Cigarettes    Quit date: 12/13/2002  . Smokeless tobacco: Current User    Types: Chew  . Alcohol Use: No    Review of Systems  Constitutional: Negative for fever, chills and diaphoresis.  HENT: Positive for congestion.   Respiratory: Positive for cough, shortness of breath and wheezing.   Cardiovascular: Positive for leg swelling. Negative for chest pain.  All other systems reviewed and are negative.  Allergies  Review of patient's allergies indicates no known allergies.  Home Medications   Prior to Admission medications   Medication Sig Start Date End Date Taking? Authorizing Provider  Aclidinium Bromide (TUDORZA PRESSAIR) 400 MCG/ACT AEPB Inhale 1 puff into the lungs 2 (two) times daily.   Yes Historical Provider, MD  albuterol (PROVENTIL HFA;VENTOLIN HFA) 108 (90 BASE) MCG/ACT inhaler Inhale 2 puffs into the lungs every 6 (six) hours as needed for wheezing or shortness of breath. 01/23/14  Yes Tanda Rockers, MD  albuterol (PROVENTIL) (2.5 MG/3ML) 0.083% nebulizer solution Take 3 mLs (2.5 mg  total) by nebulization every 6 (six) hours as needed for wheezing or shortness of breath. 11/07/13  Yes Tanda Rockers, MD  budesonide-formoterol [facility]) 160-4.5 MCG/ACT inhaler Inhale 2 puffs into the lungs 2 (two) times daily.     Yes Historical Provider, MD  DIGOX 125 MCG tablet TAKE 1 TABLET BY MOUTH ONCE DAILY 05/16/15  Yes Leonie Man, MD  diltiazem (CARDIZEM) 60 MG tablet Take 1 tablet (60 mg total) by mouth 2 (two) times daily. NEEDS APPOINTMENT FOR FUTURE REFILLS 01/30/16  Yes Leonie Man, MD  doxazosin (CARDURA) 2 MG tablet Take 2 mg by mouth daily.     Yes Historical Provider, MD  glimepiride (AMARYL) 2 MG tablet Take 2 mg by mouth daily. 03/09/16  Yes Historical Provider, MD  metFORMIN (GLUCOPHAGE-XR) 500 MG 24 hr tablet Take 1,000 mg by mouth 2 (two) times daily. 03/29/16  Yes Historical Provider, MD  mirtazapine (REMERON) 30 MG tablet Take 1 tablet (30 mg total) by mouth at  bedtime. 10/23/15  Yes Thea Silversmith, MD  oxyCODONE (OXY IR/ROXICODONE) 5 MG immediate release tablet Take 1 tablet po q 8 hours prn pain. Patient taking differently: Take 5 mg by mouth every 6 (six) hours as needed for moderate pain. Take 1 tablet po q 8 hours prn pain. 03/05/16  Yes Thea Silversmith, MD  simvastatin (ZOCOR) 40 MG tablet Take 40 mg by mouth daily at 6 PM.  06/04/15  Yes Historical Provider, MD  warfarin (COUMADIN) 5 MG tablet TAKE 1 TABLET BY MOUTH ONCE DAILY OR AS DIRECTED BY COUMADIN CLINIC Patient taking differently: Take 5 mg by mouth daily at 6 PM. Take 7.5 mg on sundays '5mg'$  on MON Tue Wed Thu Fri Sat. 12/17/15  Yes Leonie Man, MD   BP 119/82 mmHg  Pulse 96  Temp(Src) 98 F (36.7 C) (Oral)  Resp 31  SpO2 92%   Physical Exam  Constitutional: He is oriented to person, place, and time. He appears well-developed and well-nourished. No distress.  HENT:  Head: Normocephalic and atraumatic.  Eyes: Conjunctivae and EOM are normal. Pupils are equal, round, and reactive to light.   Neck: Normal range of motion. Neck supple. No JVD present.  Cardiovascular: Normal rate, regular rhythm and normal heart sounds.   No murmur heard. Pulmonary/Chest: Effort normal. He has wheezes. He has no rales. He exhibits no tenderness.  Diffuse expiratory wheezes  Abdominal: Soft. Bowel sounds are normal. He exhibits no distension and no mass. There is no tenderness.  Musculoskeletal: Normal range of motion. He exhibits edema.  1+ pitting edema bilaterally  Lymphadenopathy:    He has no cervical adenopathy.  Neurological: He is alert and oriented to person, place, and time. No cranial nerve deficit. He exhibits normal muscle tone. Coordination normal.  Skin: Skin is warm and dry. No rash noted.  Psychiatric: He has a normal mood and affect. His behavior is normal. Judgment and thought content normal.  Nursing note and vitals reviewed.   ED Course  Procedures (including critical care time)  DIAGNOSTIC STUDIES: Oxygen Saturation is 92% on RA, adequate by my interpretation.    COORDINATION OF CARE: 11:44 PM-Discussed treatment plan which includes breathing treatment with pt at bedside and pt agreed to plan.   Labs Review Results for orders placed or performed during the hospital encounter of 11/91/47  Basic metabolic panel  Result Value Ref Range   Sodium 141 135 - 145 mmol/L   Potassium 3.8 3.5 - 5.1 mmol/L   Chloride 103 101 - 111 mmol/L   CO2 30 22 - 32 mmol/L   Glucose, Bld 160 (H) 65 - 99 mg/dL   BUN 17 6 - 20 mg/dL   Creatinine, Ser 0.92 0.61 - 1.24 mg/dL   Calcium 8.8 (L) 8.9 - 10.3 mg/dL   GFR calc non Af Amer >60 >60 mL/min   GFR calc Af Amer >60 >60 mL/min   Anion gap 8 5 - 15  CBC  Result Value Ref Range   WBC 8.7 4.0 - 10.5 K/uL   RBC 4.21 (L) 4.22 - 5.81 MIL/uL   Hemoglobin 12.8 (L) 13.0 - 17.0 g/dL   HCT 38.9 (L) 39.0 - 52.0 %   MCV 92.4 78.0 - 100.0 fL   MCH 30.4 26.0 - 34.0 pg   MCHC 32.9 30.0 - 36.0 g/dL   RDW 14.0 11.5 - 15.5 %   Platelets 267  150 - 400 K/uL  Brain natriuretic peptide  Result Value Ref Range   B Natriuretic  Peptide 45.8 0.0 - 100.0 pg/mL    Imaging Review No results found. I have personally reviewed and evaluated these images and lab results as part of my medical decision-making.   EKG Interpretation   Date/Time:  Monday March 29 2016 22:14:11 EDT Ventricular Rate:  96 PR Interval:  160 QRS Duration: 88 QT Interval:  345 QTC Calculation: 436 R Axis:   68 Text Interpretation:  Sinus rhythm Low voltage, extremity leads since last  tracing no significant change Confirmed by Eulis Foster  MD, ELLIOTT ([id number]) on  03/29/2016 11:21:05 PM      MDM   Final diagnoses:  SOB (shortness of breath)    COPD exacerbation. He is given 2 albuterol with ipratropium nebulizer treatments and following this, lungs are clear. Chest x-ray shows no evidence of pneumonia. He is given new prescription for albuterol nebulizer solution and is given a prescription for prednisone. Follow-up with PCP. Old records are reviewed and he does have a known diagnosis of COPD but I see no ED visits or hospitalizations related to that.   I personally performed the services described in this documentation, which was scribed in my presence. The recorded information has been reviewed and is accurate.       Delora Fuel, MD 78/93/81 [id number]

## 2016-03-29 NOTE — ED Notes (Signed)
Bed: WA22 Expected date:  Expected time:  Means of arrival:  Comments: Triage 2 

## 2016-03-30 LAB — BRAIN NATRIURETIC PEPTIDE: B NATRIURETIC PEPTIDE 5: 45.8 pg/mL (ref 0.0–100.0)

## 2016-03-30 MED ORDER — PREDNISONE 50 MG PO TABS: 50.0000 mg | ORAL_TABLET | Freq: Every day | ORAL | Status: DC

## 2016-03-30 MED ORDER — IPRATROPIUM-ALBUTEROL 0.5-2.5 (3) MG/3ML IN SOLN
3.0000 mL | Freq: Once | RESPIRATORY_TRACT | Status: AC
  Administered 2016-03-30: 3 mL via RESPIRATORY_TRACT
  Filled 2016-03-30: qty 3

## 2016-03-30 MED ORDER — ALBUTEROL SULFATE (2.5 MG/3ML) 0.083% IN NEBU: 2.5000 mg | INHALATION_SOLUTION | Freq: Four times a day (QID) | RESPIRATORY_TRACT | Status: DC | PRN

## 2016-03-30 NOTE — Discharge Instructions (Signed)
Chronic Obstructive Pulmonary Disease Chronic obstructive pulmonary disease (COPD) is a common lung condition in which airflow from the lungs is limited. COPD is a general term that can be used to describe many different lung problems that limit airflow, including both chronic bronchitis and emphysema. If you have COPD, your lung function will probably never return to normal, but there are measures you can take to improve lung function and make yourself feel better. CAUSES   Smoking (common).  Exposure to secondhand smoke.  Genetic problems.  Chronic inflammatory lung diseases or recurrent infections. SYMPTOMS  Shortness of breath, especially with physical activity.  Deep, persistent (chronic) cough with a large amount of thick mucus.  Wheezing.  Rapid breaths (tachypnea).  Gray or bluish discoloration (cyanosis) of the skin, especially in your fingers, toes, or lips.  Fatigue.  Weight loss.  Frequent infections or episodes when breathing symptoms become much worse (exacerbations).  Chest tightness. DIAGNOSIS Your health care provider will take a medical history and perform a physical examination to diagnose COPD. Additional tests for COPD may include:  Lung (pulmonary) function tests.  Chest X-ray.  CT scan.  Blood tests. TREATMENT  Treatment for COPD may include:  Inhaler and nebulizer medicines. These help manage the symptoms of COPD and make your breathing more comfortable.  Supplemental oxygen. Supplemental oxygen is only helpful if you have a low oxygen level in your blood.  Exercise and physical activity. These are beneficial for nearly all people with COPD.  Lung surgery or transplant.  Nutrition therapy to gain weight, if you are underweight.  Pulmonary rehabilitation. This may involve working with a team of health care providers and specialists, such as respiratory, occupational, and physical therapists. HOME CARE INSTRUCTIONS  Take all medicines  (inhaled or pills) as directed by your health care provider.  Avoid over-the-counter medicines or cough syrups that dry up your airway (such as antihistamines) and slow down the elimination of secretions unless instructed otherwise by your health care provider.  If you are a smoker, the most important thing that you can do is stop smoking. Continuing to smoke will cause further lung damage and breathing trouble. Ask your health care provider for help with quitting smoking. He or she can direct you to community resources or hospitals that provide support.  Avoid exposure to irritants such as smoke, chemicals, and fumes that aggravate your breathing.  Use oxygen therapy and pulmonary rehabilitation if directed by your health care provider. If you require home oxygen therapy, ask your health care provider whether you should purchase a pulse oximeter to measure your oxygen level at home.  Avoid contact with individuals who have a contagious illness.  Avoid extreme temperature and humidity changes.  Eat healthy foods. Eating smaller, more frequent meals and resting before meals may help you maintain your strength.  Stay active, but balance activity with periods of rest. Exercise and physical activity will help you maintain your ability to do things you want to do.  Preventing infection and hospitalization is very important when you have COPD. Make sure to receive all the vaccines your health care provider recommends, especially the pneumococcal and influenza vaccines. Ask your health care provider whether you need a pneumonia vaccine.  Learn and use relaxation techniques to manage stress.  Learn and use controlled breathing techniques as directed by your health care provider. Controlled breathing techniques include:  Pursed lip breathing. Start by breathing in (inhaling) through your nose for 1 second. Then, purse your lips as if you were  going to whistle and breathe out (exhale) through the  pursed lips for 2 seconds.  Diaphragmatic breathing. Start by putting one hand on your abdomen just above your waist. Inhale slowly through your nose. The hand on your abdomen should move out. Then purse your lips and exhale slowly. You should be able to feel the hand on your abdomen moving in as you exhale.  Learn and use controlled coughing to clear mucus from your lungs. Controlled coughing is a series of short, progressive coughs. The steps of controlled coughing are: 1. Lean your head slightly forward. 2. Breathe in deeply using diaphragmatic breathing. 3. Try to hold your breath for 3 seconds. 4. Keep your mouth slightly open while coughing twice. 5. Spit any mucus out into a tissue. 6. Rest and repeat the steps once or twice as needed. SEEK MEDICAL CARE IF:  You are coughing up more mucus than usual.  There is a change in the color or thickness of your mucus.  Your breathing is more labored than usual.  Your breathing is faster than usual. SEEK IMMEDIATE MEDICAL CARE IF:  You have shortness of breath while you are resting.  You have shortness of breath that prevents you from:  Being able to talk.  Performing your usual physical activities.  You have chest pain lasting longer than 5 minutes.  Your skin color is more cyanotic than usual.  You measure low oxygen saturations for longer than 5 minutes with a pulse oximeter. MAKE SURE YOU:  Understand these instructions.  Will watch your condition.  Will get help right away if you are not doing well or get worse.   This information is not intended to replace advice given to you by your health care provider. Make sure you discuss any questions you have with your health care provider.   Document Released: 09/08/2005 Document Revised: 12/20/2014 Document Reviewed: 07/26/2013 Elsevier Interactive Patient Education 2016 Elsevier Inc.  Albuterol inhalation solution What is this medicine? ALBUTEROL (al [place]) is a  bronchodilator. It helps to open up the airways in your lungs to make it easier to breathe. This medicine is used to treat and to prevent bronchospasm. This medicine may be used for other purposes; ask your health care provider or pharmacist if you have questions. What should I tell my health care provider before I take this medicine? They need to know if you have any of the following conditions: -diabetes -heart disease or irregular heartbeat -high blood pressure -pheochromocytoma -seizures -thyroid disease -an unusual or allergic reaction to albuterol, levalbuterol, sulfites, other medicines, foods, dyes, or preservatives -pregnant or trying to get pregnant -breast-feeding How should I use this medicine? This medicine is used in a nebulizer. Nebulizers make a liquid into an aerosol that you breathe in through your mouth or your mouth and nose into your lungs. You will be taught how to use your nebulizer. Follow the directions on your prescription label. Take your medicine at regular intervals. Do not use more often than directed. Talk to your pediatrician regarding the use of this medicine in children. Special care may be needed. Overdosage: If you think you have taken too much of this medicine contact a poison control center or emergency room at once. NOTE: This medicine is only for you. Do not share this medicine with others. What if I miss a dose? If you miss a dose, use it as soon as you can. If it is almost time for your next dose, use only that dose.  Do not use double or extra doses. What may interact with this medicine? -anti-infectives like chloroquine and pentamidine -caffeine -cisapride -diuretics -medicines for colds -medicines for depression or emotional or psychotic conditions -medicines for weight loss including some herbal products -methadone -some antibiotics like clarithromycin, erythromycin, levofloxacin, and linezolid -some heart medicines -steroid hormones like  dexamethasone, cortisone, hydrocortisone -theophylline -thyroid hormones This list may not describe all possible interactions. Give your health care provider a list of all the medicines, herbs, non-prescription drugs, or dietary supplements you use. Also tell them if you smoke, drink alcohol, or use illegal drugs. Some items may interact with your medicine. What should I watch for while using this medicine? Tell your doctor or health care professional if your symptoms do not improve. Do not use extra albuterol. Call your doctor right away if your asthma or bronchitis gets worse while you are using this medicine. If your mouth gets dry try chewing sugarless gum or sucking hard candy. Drink water as directed. What side effects may I notice from receiving this medicine? Side effects that you should report to your doctor or health care professional as soon as possible: -allergic reactions like skin rash, itching or hives, swelling of the face, lips, or tongue -breathing problems -chest pain -feeling faint or lightheaded, falls -high blood pressure -irregular heartbeat -fever -muscle cramps or weakness -pain, tingling, numbness in the hands or feet -vomiting Side effects that usually do not require medical attention (report to your doctor or health care professional if they continue or are bothersome): -cough -difficulty sleeping -headache -nervousness, trembling -stomach upset -stuffy or runny nose -throat irritation -unusual taste This list may not describe all possible side effects. Call your doctor for medical advice about side effects. You may report side effects to FDA at 1-800-FDA-1088. Where should I keep my medicine? Keep out of the reach of children. Store between 2 and 25 degrees C (36 and 77 degrees F). Do not freeze. Protect from light. Throw away any unused medicine after the expiration date. Most products are kept in the foil package until time of use. Some products can be  used up to 1 week after they are removed from the foil pouch. Check the instructions that come with your medicine. NOTE: This sheet is a summary. It may not cover all possible information. If you have questions about this medicine, talk to your doctor, pharmacist, or health care provider.    2016, Elsevier/Gold Standard. (2011-08-20 15:19:55)  Prednisone tablets What is this medicine? PREDNISONE (PRED ni sone) is a corticosteroid. It is commonly used to treat inflammation of the skin, joints, lungs, and other organs. Common conditions treated include asthma, allergies, and arthritis. It is also used for other conditions, such as blood disorders and diseases of the adrenal glands. This medicine may be used for other purposes; ask your health care provider or pharmacist if you have questions. What should I tell my health care provider before I take this medicine? They need to know if you have any of these conditions: -Cushing's syndrome -diabetes -glaucoma -heart disease -high blood pressure -infection (especially a virus infection such as chickenpox, cold sores, or herpes) -kidney disease -liver disease -mental illness -myasthenia gravis -osteoporosis -seizures -stomach or intestine problems -thyroid disease -an unusual or allergic reaction to lactose, prednisone, other medicines, foods, dyes, or preservatives -pregnant or trying to get pregnant -breast-feeding How should I use this medicine? Take this medicine by mouth with a glass of water. Follow the directions on the prescription label. Take  this medicine with food. If you are taking this medicine once a day, take it in the morning. Do not take more medicine than you are told to take. Do not suddenly stop taking your medicine because you may develop a severe reaction. Your doctor will tell you how much medicine to take. If your doctor wants you to stop the medicine, the dose may be slowly lowered over time to avoid any side  effects. Talk to your pediatrician regarding the use of this medicine in children. Special care may be needed. Overdosage: If you think you have taken too much of this medicine contact a poison control center or emergency room at once. NOTE: This medicine is only for you. Do not share this medicine with others. What if I miss a dose? If you miss a dose, take it as soon as you can. If it is almost time for your next dose, talk to your doctor or health care professional. You may need to miss a dose or take an extra dose. Do not take double or extra doses without advice. What may interact with this medicine? Do not take this medicine with any of the following medications: -metyrapone -mifepristone This medicine may also interact with the following medications: -aminoglutethimide -amphotericin B -aspirin and aspirin-like medicines -barbiturates -certain medicines for diabetes, like glipizide or glyburide -cholestyramine -cholinesterase inhibitors -cyclosporine -digoxin -diuretics -ephedrine -male hormones, like estrogens and birth control pills -isoniazid -ketoconazole -NSAIDS, medicines for pain and inflammation, like ibuprofen or naproxen -phenytoin -rifampin -toxoids -vaccines -warfarin This list may not describe all possible interactions. Give your health care provider a list of all the medicines, herbs, non-prescription drugs, or dietary supplements you use. Also tell them if you smoke, drink alcohol, or use illegal drugs. Some items may interact with your medicine. What should I watch for while using this medicine? Visit your doctor or health care professional for regular checks on your progress. If you are taking this medicine over a prolonged period, carry an identification card with your name and address, the type and dose of your medicine, and your doctor's name and address. This medicine may increase your risk of getting an infection. Tell your doctor or health care  professional if you are around anyone with measles or chickenpox, or if you develop sores or blisters that do not heal properly. If you are going to have surgery, tell your doctor or health care professional that you have taken this medicine within the last twelve months. Ask your doctor or health care professional about your diet. You may need to lower the amount of salt you eat. This medicine may affect blood sugar levels. If you have diabetes, check with your doctor or health care professional before you change your diet or the dose of your diabetic medicine. What side effects may I notice from receiving this medicine? Side effects that you should report to your doctor or health care professional as soon as possible: -allergic reactions like skin rash, itching or hives, swelling of the face, lips, or tongue -changes in emotions or moods -changes in vision -depressed mood -eye pain -fever or chills, cough, sore throat, pain or difficulty passing urine -increased thirst -swelling of ankles, feet Side effects that usually do not require medical attention (report to your doctor or health care professional if they continue or are bothersome): -confusion, excitement, restlessness -headache -nausea, vomiting -skin problems, acne, thin and shiny skin -trouble sleeping -weight gain This list may not describe all possible side effects. Call your doctor  for medical advice about side effects. You may report side effects to FDA at 1-800-FDA-1088. Where should I keep my medicine? Keep out of the reach of children. Store at room temperature between 15 and 30 degrees C (59 and 86 degrees F). Protect from light. Keep container tightly closed. Throw away any unused medicine after the expiration date. NOTE: This sheet is a summary. It may not cover all possible information. If you have questions about this medicine, talk to your doctor, pharmacist, or health care provider.    2016, Elsevier/Gold  Standard. (2011-07-15 10:57:14)

## 2016-03-30 NOTE — ED Notes (Signed)
Patient finished with treatment. Patient is not wheezing and breathing on room air without any difficulty.

## 2016-04-09 ENCOUNTER — Encounter: Payer: Medicare Other | Admitting: Pharmacist Clinician (PhC)/ Clinical Pharmacy Specialist

## 2016-04-13 ENCOUNTER — Encounter: Payer: Self-pay | Admitting: Pulmonary Disease

## 2016-04-13 ENCOUNTER — Ambulatory Visit (INDEPENDENT_AMBULATORY_CARE_PROVIDER_SITE_OTHER): Payer: Medicare Other | Admitting: Pulmonary Disease

## 2016-04-13 ENCOUNTER — Ambulatory Visit (INDEPENDENT_AMBULATORY_CARE_PROVIDER_SITE_OTHER)
Admission: RE | Admit: 2016-04-13 | Discharge: 2016-04-13 | Disposition: A | Discharge status: Home / Self Care | Payer: Medicare Other | Source: Ambulatory Visit | Attending: Pulmonary Disease | Admitting: Pulmonary Disease

## 2016-04-13 VITALS — BP 122/86 | HR 92 | Ht 66.0 in | Wt 157.0 lb

## 2016-04-13 DIAGNOSIS — J449 Chronic obstructive pulmonary disease, unspecified: Secondary | ICD-10-CM

## 2016-04-13 MED ORDER — ALBUTEROL SULFATE (2.5 MG/3ML) 0.083% IN NEBU
2.5000 mg | INHALATION_SOLUTION | Freq: Once | RESPIRATORY_TRACT | Status: AC
  Administered 2016-04-13: 2.5 mg via RESPIRATORY_TRACT

## 2016-04-13 MED ORDER — METHYLPREDNISOLONE ACETATE 80 MG/ML IJ SUSP
80.0000 mg | Freq: Once | INTRAMUSCULAR | Status: AC
  Administered 2016-04-13: 80 mg via INTRAMUSCULAR

## 2016-04-13 MED ORDER — AZITHROMYCIN 250 MG PO TABS: ORAL_TABLET | ORAL | Status: DC

## 2016-04-13 MED ORDER — PREDNISONE 10 MG PO TABS: ORAL_TABLET | ORAL | Status: DC

## 2016-04-13 NOTE — Patient Instructions (Signed)
We will give you breathing treatment now and a depo injection 80 mg. You will be send for an x ray. We will give you some antibiotics (z pack). Pred taper starting at 60 mg daily. Reduce dose by 10 mg every 3 days, We will also give you a sample of spiriva.   Return in 2 weeks to assess response.

## 2016-04-13 NOTE — Progress Notes (Signed)
Subjective:    Patient ID: Brent Wade, male    DOB: 1941-10-23, 75 y.o.   MRN: [id number]  HPI Follow-up for severe COPD.  Brent Wade is a 75 year old with severe COPD, rt lung adenocarcinoma s/p XRT in 2015. He is a former patient of Dr. Joya Gaskins. He's been having difficulty breathing over the past 4 weeks with wheeze, cough, thick yellow sputum. No fevers or chills. He is just maintained on symbicort and albuterol nebs. He was given a trial of spiriva in 2015 but is not on it now. He was seen in the emergency room on 03/29/16 and given a pred taper that has helped somewhat with the symptoms.   He is being followed by radiation oncology and the last CT scan on feb 2017 shows recurrent tumor and evolving radiation fibrosis. As the lung nodule shows very slow growth he is under continued observation with no plan to for repeat chemo or radiation.   DATA: PFTs 03/27/12 FVC 2.34 [58%] FEV1 0.84 [31%], post BD 1.08 (40%] F/F 36 TLC 119% DLCO 47% Severe obstructive lung disease, moderate diffusion defect, positive bronchodilator response.  CT chest 01/23/16 1. 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 Interval growth of 1.4 cm pulmonary nodule in the central right upper lobe just inferior to the radiation fibrosis, in keeping with recurrent tumor. 2. Stable fullness in the right upper hilum likely representing stable right upper hilar adenopathy, poorly delineated on this noncontrast study. 3. Mild subcarinal lymphadenopathy, increased. Previously described right paratracheal lymphadenopathy has resolved. 4. New trace layering right pleural effusion. 5. New 3 mm right lower lobe pulmonary nodule, possibly metastatic. 6. Slight growth of left lower lobe ground-glass pulmonary nodule, suspicious for indolent metachronous adenocarcinoma. 7. Mild to moderate emphysema with diffuse bronchial wall thickening, suggesting COPD. 8. Moderate T5 and mild T6 vertebral  compression fractures, new.  Social History: He has a 60-pack-year smoking history. He quit in 2004. No alcohol, drug use.  Family History: Daughter-lung cancer, diabetes, hypertension, hyperlipidemia. Sister-lung cancer, diabetes.  Past Medical History  Diagnosis Date  . Hyperlipidemia   . Type II or unspecified type diabetes mellitus without mention of complication, not stated as uncontrolled     twice/week  . Chronic airway obstruction, not elsewhere classified     HFA 25-50% 07-16-2010> 100% 01-28-2011; PFT's 08-13-10 FEV1 .93(34%) ratio 37 with 23% better p B2 DLCO 69%  . Shortness of breath   . Hypertension   . Arthritis     in hands  . PAF (paroxysmal atrial fibrillation) [facility]) Nov 2014    Atrial Fibrillation; takes Cardizem, Digoxi, on Warfarin;; TEE DCCV 03/18/2014   . Lung cancer ([place]) 11/06/2013    Diagnosed during bronchoscopy  . Radiation 12/25/13-01/24/14    Right upper lobe lung  . Adenocarcinoma, lung ([place]) 07/06/2011    Followed in Pulmonary clinic/ [facility]/ [place]   - See cxr 07/06/2011 >  No change 09/24/11 > no change 03/27/2012 > ? slt progression 11/16/2012 > def progression 07/03/2013 - CT chest 08/16/13 1. Aggressive appearing right upper lobe pulmonary mass and nodule  with a right hilar lymphadenopathy, as detailed above. This is  favored to represent a primary bronchogenic neoplasm with nodal  metastasis to the right hilum and satellite nodule in the same lobe  (i.e., T3, N1, Mx disease), however, this could alternatively  represent synchronous right upper lobe neoplasms, or less likely  an aggressive atypical infectious process such as a fungal  pneumonia (not strongly favored).  Clinical correlation is strongly  recommended, with consideration for PET CT and/or biopsy at this  time.  2. In addition, there is a nonspecific 1.7 x 1.4 cm ground-glass  attenuation nodule in the left lower lobe which warrants attention  on follow-up studies - fob 08/21/13 > extrinsic  compression of BI/ wang and tbbx >     Current outpatient prescriptions:  .  albuterol (PROVENTIL HFA;VENTOLIN HFA) 108 (90 BASE) MCG/ACT inhaler, Inhale 2 puffs into the lungs every 6 (six) hours as needed for wheezing or shortness of breath., Disp: 1 Inhaler, Rfl: 6 .  albuterol (PROVENTIL) (2.5 MG/3ML) 0.083% nebulizer solution, Take 3 mLs (2.5 mg total) by nebulization every 6 (six) hours as needed for wheezing or shortness of breath., Disp: 75 mL, Rfl: 0 .  budesonide-formoterol (SYMBICORT) 160-4.5 MCG/ACT inhaler, Inhale 2 puffs into the lungs 2 (two) times daily.  , Disp: , Rfl:  .  DIGOX 125 MCG tablet, TAKE 1 TABLET BY MOUTH ONCE DAILY, Disp: 90 tablet, Rfl: 3 .  diltiazem (CARDIZEM) 60 MG tablet, Take 1 tablet (60 mg total) by mouth 2 (two) times daily. NEEDS APPOINTMENT FOR FUTURE REFILLS, Disp: 60 tablet, Rfl: 0 .  doxazosin (CARDURA) 2 MG tablet, Take 2 mg by mouth daily.  , Disp: , Rfl:  .  glimepiride (AMARYL) 2 MG tablet, Take 2 mg by mouth daily., Disp: , Rfl:  .  metFORMIN (GLUCOPHAGE-XR) 500 MG 24 hr tablet, Take 1,000 mg by mouth 2 (two) times daily., Disp: , Rfl:  .  mirtazapine (REMERON) 30 MG tablet, Take 1 tablet (30 mg total) by mouth at bedtime., Disp: 30 tablet, Rfl: 1 .  oxyCODONE (OXY IR/ROXICODONE) 5 MG immediate release tablet, Take 1 tablet po q 8 hours prn pain. (Patient taking differently: Take 5 mg by mouth every 6 (six) hours as needed for moderate pain. Take 1 tablet po q 8 hours prn pain.), Disp: 90 tablet, Rfl: 0 .  predniSONE (DELTASONE) 50 MG tablet, Take 1 tablet (50 mg total) by mouth daily., Disp: 5 tablet, Rfl: 0 .  simvastatin (ZOCOR) 40 MG tablet, Take 40 mg by mouth daily at 6 PM. , Disp: , Rfl:  .  warfarin (COUMADIN) 5 MG tablet, TAKE 1 TABLET BY MOUTH ONCE DAILY OR AS DIRECTED BY COUMADIN CLINIC (Patient taking differently: Take 5 mg by mouth daily at 6 PM. Take 7.5 mg on sundays '5mg'$  on MON Tue Wed Thu Fri Sat.), Disp: 90 tablet, Rfl: 1  Review of  Systems Cough, sputum production, dyspnea, wheezing. No hemoptysis. No nausea, vomiting, diarrhea, consultation. No chest pain, palpitation. No fevers, chills, loss of weight, loss of appetite. All other review of systems is negative.    Objective:   Physical Exam Blood pressure 122/86, pulse 92, height '5\' 6"'$  (1.676 m), weight 157 lb (71.215 kg), SpO2 90 %. Gen: No apparent distress Neuro: No gross focal deficits. HEENT: No JVD, lymphadenopathy, thyromegaly. RS: B/L exp wheeze. No crackles, mild increase in WOB CVS: S1-S2 heard, no murmurs rubs gallops. Abdomen: Soft, positive bowel sounds. Musculoskeletal: No edema.    Assessment & Plan:  #1 Severe COPD. He has has recurrent exacerbations over the past few months. I will evaluate by getting a CXR. He will receive a breathing treatment,a dose of solumedrol 80 mg in office today and get started on a pred taper starting at 60 mg. I will give him a Z pack and restart him on spirva in addition to the symbicort and albuterol. He  may be a candidate for daliresp as he has frequent exacerbations. I will reassess at next visit after he gets over this acute episode.   #2 Lung adenocarcinoma Under continued observation. Repeat CT in 1 year pending.  Plan: - Chest X ray, Z pack - Solumedrol 80 mg IM today and pred taper starting at 60 mg. Reduce dose by 10 mg every 2 days. - Add spirivia. Continue symbicort and albuterol.  Return in 1 month.  Marshell Garfinkel MD [facility] Pager [phone] If no answer or after 3pm call: [phone] 04/20/2016, 9:15 AM

## 2016-04-16 NOTE — Progress Notes (Signed)
Quick Note:  Called spoke with patient, advised of cxr results / recs as stated by PM. Pt verbalized his understanding and denied any questions. ______

## 2016-04-20 ENCOUNTER — Telehealth: Payer: Self-pay | Admitting: *Deleted

## 2016-04-20 ENCOUNTER — Telehealth: Payer: Self-pay | Admitting: Pulmonary Disease

## 2016-04-20 ENCOUNTER — Other Ambulatory Visit: Payer: Self-pay | Admitting: Radiation Oncology

## 2016-04-20 DIAGNOSIS — C3490 Malignant neoplasm of unspecified part of unspecified bronchus or lung: Secondary | ICD-10-CM

## 2016-04-20 MED ORDER — OXYCODONE HCL 5 MG PO TABS: ORAL_TABLET | ORAL | Status: DC

## 2016-04-20 NOTE — Telephone Encounter (Signed)
Called to let know that his prescription refill was ready for pick up and asked how many Oxycodone was he taking a day per Dr. Pablo Ledger  Brent Wade is now taking up tp threee Oxycodone a day as needed for pain.

## 2016-04-20 NOTE — Telephone Encounter (Signed)
Spoke with Mr. Snowball he is requesting a refill on his Oxycodone '5mg'$ . And would like to pick it up today.

## 2016-04-20 NOTE — Telephone Encounter (Signed)
PM called patient earlier today, per AVS will need to be seen in 2 weeks to reassess.    Patient Instructions       We will give you breathing treatment now and a depo injection 80 mg. You will be send for an x ray. We will give you some antibiotics (z pack). Pred taper starting at 60 mg daily. Reduce dose by 10 mg every 3 days, We will also give you a sample of spiriva.   Return in 2 weeks to assess response.   Called spoke with patient's daughter [name] who reported pt is doing well but she does note some increased LE edema.  No increase in respiratory symptoms since last ov.  PM is not back in the office until 6/1 and 6/2 - appt scheduled with Brandi for 5.15.17 @ 12N.  [name] is aware to call the office if pt's edema worsens or he develops worsening breathing before the weekend.  Nothing further needed at this time; will sign off.

## 2016-04-20 NOTE — Telephone Encounter (Signed)
Called and spoke with daughter, not sure why patient received call saying he needed to be seen sooner. Patient has appointment with Dr. Vaughan Browner on 05/14/16. Dr. Vaughan Browner, do you need to see patient sooner?  Please advise.

## 2016-04-26 ENCOUNTER — Ambulatory Visit (INDEPENDENT_AMBULATORY_CARE_PROVIDER_SITE_OTHER): Payer: Medicare Other | Admitting: Pulmonary Disease

## 2016-04-26 ENCOUNTER — Encounter: Payer: Self-pay | Admitting: Pulmonary Disease

## 2016-04-26 VITALS — BP 126/62 | HR 107

## 2016-04-26 DIAGNOSIS — J449 Chronic obstructive pulmonary disease, unspecified: Secondary | ICD-10-CM | POA: Diagnosis not present

## 2016-04-26 DIAGNOSIS — R600 Localized edema: Secondary | ICD-10-CM | POA: Diagnosis not present

## 2016-04-26 NOTE — Patient Instructions (Addendum)
1.  Please elevate your feet during the day  2.  Complete your prednisone as follows >> 30 mg on 5/15, 20 mg on 5/16, and 10 mg on 5/17 3.  Please call if your swelling does not improve in the week following completion of steroids (prednisone) 4.  Follow up with Dr. Vaughan Browner in 3 months or sooner if new needs arise 5.  Please call if new or worsening symptoms 6.  Continue your spiriva, symbicort and albuterol as prescribed

## 2016-04-26 NOTE — Progress Notes (Signed)
Current Outpatient Prescriptions on File Prior to Visit  Medication Sig  . albuterol (PROVENTIL HFA;VENTOLIN HFA) 108 (90 BASE) MCG/ACT inhaler Inhale 2 puffs into the lungs every 6 (six) hours as needed for wheezing or shortness of breath.  Marland Kitchen albuterol (PROVENTIL) (2.5 MG/3ML) 0.083% nebulizer solution Take 3 mLs (2.5 mg total) by nebulization every 6 (six) hours as needed for wheezing or shortness of breath.  . budesonide-formoterol (SYMBICORT) 160-4.5 MCG/ACT inhaler Inhale 2 puffs into the lungs 2 (two) times daily.    Marland Kitchen DIGOX 125 MCG tablet TAKE 1 TABLET BY MOUTH ONCE DAILY  . diltiazem (CARDIZEM) 60 MG tablet Take 1 tablet (60 mg total) by mouth 2 (two) times daily. NEEDS APPOINTMENT FOR FUTURE REFILLS  . doxazosin (CARDURA) 2 MG tablet Take 2 mg by mouth daily.    Marland Kitchen glimepiride (AMARYL) 2 MG tablet Take 2 mg by mouth daily.  . metFORMIN (GLUCOPHAGE-XR) 500 MG 24 hr tablet Take 1,000 mg by mouth 2 (two) times daily.  . mirtazapine (REMERON) 30 MG tablet Take 1 tablet (30 mg total) by mouth at bedtime.  Marland Kitchen oxyCODONE (OXY IR/ROXICODONE) 5 MG immediate release tablet Take 1 tablet po q 8 hours prn pain.  . predniSONE (DELTASONE) 10 MG tablet Take 6 tabs by mouth for 3 days, then 5 for 3 days, 4 for 3 days, 3 for 3 days, 2 for 3 days, 1 for 3 days and stop  . simvastatin (ZOCOR) 40 MG tablet Take 40 mg by mouth daily at 6 PM.   . warfarin (COUMADIN) 5 MG tablet TAKE 1 TABLET BY MOUTH ONCE DAILY OR AS DIRECTED BY COUMADIN CLINIC (Patient taking differently: Take 5 mg by mouth daily at 6 PM. Take 7.5 mg on sundays '5mg'$  on MON Tue Wed Thu Fri Sat.)  . predniSONE (DELTASONE) 50 MG tablet Take 1 tablet (50 mg total) by mouth daily. (Patient not taking: Reported on 04/26/2016)   No current facility-administered medications on file prior to visit.     Chief Complaint  Patient presents with  . Follow-up    pt c/o sob with exertion, b/l LE edema, prod cough with thick yellow mucus but has difficulty  coughing this up- states he has improved since 04/13/16 ov with PM.      Tests   Past medical hx  has a past medical history of Hyperlipidemia; Type II or unspecified type diabetes mellitus without mention of complication, not stated as uncontrolled; Chronic airway obstruction, not elsewhere classified; Shortness of breath; Hypertension; Arthritis; PAF (paroxysmal atrial fibrillation) [facility]) (Nov 2014); Lung cancer ([place]) (11/06/2013); Radiation (12/25/13-01/24/14); and Adenocarcinoma, lung ([place]) (07/06/2011).   Past surgical hx, Allergies, Family hx, Social hx all reviewed.  Vital Signs BP 126/62 mmHg  Pulse 107  SpO2 93%   History of Present Illness Brent Wade is a 75 y.o. 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  The patient was treated with prednisone taper & Z-pak.  He returns 5/15 for follow up.    The patient reports feeling much better.  He completed the antibiotics as prescribed.  However, he has not taken the prednisone as prescribed due to swelling in his lower extremities.  He reports his breathing  is much improved.  He has been using his nebulizer every 4 hours > he has been able to sleep at night which is an improvement.  The patient denies fevers, chills, n/v/d, chest pain.  He reports he has been able to mobilize sputum with movement and breathing treatments.      DATA: PFTs 03/27/12 FVC 2.34 [58%] FEV1 0.84 [31%], post BD 1.08 (40%] F/F 36 TLC 119% DLCO 47% Severe obstructive lung disease, moderate diffusion defect, positive bronchodilator response.  CT chest 01/23/16 1. 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 Interval growth of  1.4 cm pulmonary nodule in the central right upper lobe just inferior to the radiation fibrosis, in keeping with recurrent tumor. 2. Stable fullness in the right upper hilum likely representing stable right upper hilar adenopathy, poorly delineated on this noncontrast study. 3. Mild subcarinal lymphadenopathy, increased. Previously described right paratracheal lymphadenopathy has resolved. 4. New trace layering right pleural effusion. 5. New 3 mm right lower lobe pulmonary nodule, possibly metastatic. 6. Slight growth of left lower lobe ground-glass pulmonary nodule, suspicious for indolent metachronous adenocarcinoma. 7. Mild to moderate emphysema with diffuse bronchial wall thickening, suggesting COPD. 8. Moderate T5 and mild T6 vertebral compression fractures, new.  Physical Exam  General - chronically ill appearing male in no acute distress ENT - No sinus tenderness, no oral exudate, no LAN Cardiac - s1s2 regular, no murmur Chest - even/non-labored, lungs bilaterally coarse, basilar faint crackles, wheezing Back - No focal tenderness Abd - Soft, non-tender Ext - No edema Neuro - Normal strength Skin - No rashes Psych - normal mood, and behavior   Assessment/Plan  1.  Resolving AECOPD   Plan:  Pt instructed to complete 30 mg Prednisone 5/15, '20mg'$  5/16 and then 10 mg 5/17 due to lower extremity swelling  Continue current home regimen > sprivia, symbicort, albuterol  Follow up with Dr. Vaughan Browner as needed   2.  LE swelling   Plan: Pt instructed to elevate LE's during day If not resolved in one week, consider 1 x dose diuretics vs compression stockings  Patient Instructions  1.  Please elevate your feet during the day  2.  Complete your prednisone as follows >> 30 mg on 5/15, 20 mg on 5/16, and 10 mg on 5/17 3.  Please call if your swelling does not improve in the week following completion of steroids (prednisone) 4.  Follow up with Dr. Vaughan Browner in 3 months or sooner if  new needs arise 5.  Please call if new or worsening symptoms 6.  Continue your spiriva, symbicort and albuterol as prescribed      Noe Gens, NP-C [facility]  [phone] 04/26/2016, 9:57 PM

## 2016-05-10 ENCOUNTER — Inpatient Hospital Stay (HOSPITAL_COMMUNITY)
Admission: EM | Admit: 2016-05-10 | Discharge: 2016-05-17 | DRG: 208 | Payer: Medicare Other | Attending: Internal Medicine | Admitting: Internal Medicine

## 2016-05-10 ENCOUNTER — Emergency Department (HOSPITAL_COMMUNITY): Payer: Medicare Other

## 2016-05-10 ENCOUNTER — Encounter (HOSPITAL_COMMUNITY): Payer: Self-pay | Admitting: *Deleted

## 2016-05-10 DIAGNOSIS — Z6824 Body mass index (BMI) 24.0-24.9, adult: Secondary | ICD-10-CM

## 2016-05-10 DIAGNOSIS — J9801 Acute bronchospasm: Secondary | ICD-10-CM | POA: Diagnosis present

## 2016-05-10 DIAGNOSIS — R410 Disorientation, unspecified: Secondary | ICD-10-CM

## 2016-05-10 DIAGNOSIS — J9621 Acute and chronic respiratory failure with hypoxia: Secondary | ICD-10-CM | POA: Insufficient documentation

## 2016-05-10 DIAGNOSIS — Q211 Atrial septal defect: Secondary | ICD-10-CM

## 2016-05-10 DIAGNOSIS — K219 Gastro-esophageal reflux disease without esophagitis: Secondary | ICD-10-CM | POA: Diagnosis present

## 2016-05-10 DIAGNOSIS — J9622 Acute and chronic respiratory failure with hypercapnia: Secondary | ICD-10-CM | POA: Diagnosis present

## 2016-05-10 DIAGNOSIS — T380X5A Adverse effect of glucocorticoids and synthetic analogues, initial encounter: Secondary | ICD-10-CM | POA: Diagnosis present

## 2016-05-10 DIAGNOSIS — Y95 Nosocomial condition: Secondary | ICD-10-CM | POA: Diagnosis present

## 2016-05-10 DIAGNOSIS — I110 Hypertensive heart disease with heart failure: Secondary | ICD-10-CM | POA: Diagnosis present

## 2016-05-10 DIAGNOSIS — Z87891 Personal history of nicotine dependence: Secondary | ICD-10-CM

## 2016-05-10 DIAGNOSIS — J441 Chronic obstructive pulmonary disease with (acute) exacerbation: Secondary | ICD-10-CM | POA: Diagnosis present

## 2016-05-10 DIAGNOSIS — G9341 Metabolic encephalopathy: Secondary | ICD-10-CM | POA: Diagnosis present

## 2016-05-10 DIAGNOSIS — Z7984 Long term (current) use of oral hypoglycemic drugs: Secondary | ICD-10-CM

## 2016-05-10 DIAGNOSIS — J440 Chronic obstructive pulmonary disease with acute lower respiratory infection: Principal | ICD-10-CM | POA: Diagnosis present

## 2016-05-10 DIAGNOSIS — Z7401 Bed confinement status: Secondary | ICD-10-CM

## 2016-05-10 DIAGNOSIS — R0902 Hypoxemia: Secondary | ICD-10-CM | POA: Insufficient documentation

## 2016-05-10 DIAGNOSIS — Z515 Encounter for palliative care: Secondary | ICD-10-CM | POA: Insufficient documentation

## 2016-05-10 DIAGNOSIS — J9600 Acute respiratory failure, unspecified whether with hypoxia or hypercapnia: Secondary | ICD-10-CM | POA: Diagnosis present

## 2016-05-10 DIAGNOSIS — I480 Paroxysmal atrial fibrillation: Secondary | ICD-10-CM | POA: Diagnosis present

## 2016-05-10 DIAGNOSIS — Z7189 Other specified counseling: Secondary | ICD-10-CM | POA: Insufficient documentation

## 2016-05-10 DIAGNOSIS — Z7901 Long term (current) use of anticoagulants: Secondary | ICD-10-CM

## 2016-05-10 DIAGNOSIS — E872 Acidosis, unspecified: Secondary | ICD-10-CM | POA: Insufficient documentation

## 2016-05-10 DIAGNOSIS — Z85118 Personal history of other malignant neoplasm of bronchus and lung: Secondary | ICD-10-CM

## 2016-05-10 DIAGNOSIS — E1165 Type 2 diabetes mellitus with hyperglycemia: Secondary | ICD-10-CM | POA: Diagnosis present

## 2016-05-10 DIAGNOSIS — I429 Cardiomyopathy, unspecified: Secondary | ICD-10-CM | POA: Diagnosis present

## 2016-05-10 DIAGNOSIS — Z7951 Long term (current) use of inhaled steroids: Secondary | ICD-10-CM

## 2016-05-10 DIAGNOSIS — Z85528 Personal history of other malignant neoplasm of kidney: Secondary | ICD-10-CM

## 2016-05-10 DIAGNOSIS — R0602 Shortness of breath: Secondary | ICD-10-CM | POA: Diagnosis not present

## 2016-05-10 DIAGNOSIS — E785 Hyperlipidemia, unspecified: Secondary | ICD-10-CM | POA: Diagnosis present

## 2016-05-10 DIAGNOSIS — Z905 Acquired absence of kidney: Secondary | ICD-10-CM

## 2016-05-10 DIAGNOSIS — Z923 Personal history of irradiation: Secondary | ICD-10-CM

## 2016-05-10 DIAGNOSIS — E874 Mixed disorder of acid-base balance: Secondary | ICD-10-CM | POA: Diagnosis present

## 2016-05-10 DIAGNOSIS — D649 Anemia, unspecified: Secondary | ICD-10-CM | POA: Diagnosis present

## 2016-05-10 DIAGNOSIS — K5901 Slow transit constipation: Secondary | ICD-10-CM | POA: Insufficient documentation

## 2016-05-10 DIAGNOSIS — I2510 Atherosclerotic heart disease of native coronary artery without angina pectoris: Secondary | ICD-10-CM | POA: Diagnosis present

## 2016-05-10 DIAGNOSIS — R627 Adult failure to thrive: Secondary | ICD-10-CM | POA: Diagnosis present

## 2016-05-10 DIAGNOSIS — I5022 Chronic systolic (congestive) heart failure: Secondary | ICD-10-CM | POA: Diagnosis present

## 2016-05-10 DIAGNOSIS — E669 Obesity, unspecified: Secondary | ICD-10-CM | POA: Diagnosis present

## 2016-05-10 DIAGNOSIS — I959 Hypotension, unspecified: Secondary | ICD-10-CM | POA: Diagnosis present

## 2016-05-10 DIAGNOSIS — Z66 Do not resuscitate: Secondary | ICD-10-CM | POA: Diagnosis present

## 2016-05-10 DIAGNOSIS — J189 Pneumonia, unspecified organism: Secondary | ICD-10-CM | POA: Insufficient documentation

## 2016-05-10 DIAGNOSIS — Z7952 Long term (current) use of systemic steroids: Secondary | ICD-10-CM

## 2016-05-10 LAB — COMPREHENSIVE METABOLIC PANEL
ALT: 20 U/L (ref 17–63)
AST: 19 U/L (ref 15–41)
Albumin: 3.8 g/dL (ref 3.5–5.0)
Alkaline Phosphatase: 58 U/L (ref 38–126)
Anion gap: 6 (ref 5–15)
BILIRUBIN TOTAL: 0.4 mg/dL (ref 0.3–1.2)
BUN: 10 mg/dL (ref 6–20)
CO2: 35 mmol/L — ABNORMAL HIGH (ref 22–32)
CREATININE: 0.89 mg/dL (ref 0.61–1.24)
Calcium: 9.1 mg/dL (ref 8.9–10.3)
Chloride: 97 mmol/L — ABNORMAL LOW (ref 101–111)
Glucose, Bld: 168 mg/dL — ABNORMAL HIGH (ref 65–99)
POTASSIUM: 4.4 mmol/L (ref 3.5–5.1)
Sodium: 138 mmol/L (ref 135–145)
TOTAL PROTEIN: 6.2 g/dL — AB (ref 6.5–8.1)

## 2016-05-10 LAB — CBC WITH DIFFERENTIAL/PLATELET
BASOS ABS: 0 10*3/uL (ref 0.0–0.1)
Basophils Relative: 0 %
EOS ABS: 0 10*3/uL (ref 0.0–0.7)
EOS PCT: 0 %
HCT: 42.1 % (ref 39.0–52.0)
Hemoglobin: 12.7 g/dL — ABNORMAL LOW (ref 13.0–17.0)
LYMPHS PCT: 14 %
Lymphs Abs: 1 10*3/uL (ref 0.7–4.0)
MCH: 29.3 pg (ref 26.0–34.0)
MCHC: 30.2 g/dL (ref 30.0–36.0)
MCV: 97.2 fL (ref 78.0–100.0)
Monocytes Absolute: 0.7 10*3/uL (ref 0.1–1.0)
Monocytes Relative: 11 %
Neutro Abs: 5.3 10*3/uL (ref 1.7–7.7)
Neutrophils Relative %: 75 %
PLATELETS: 238 10*3/uL (ref 150–400)
RBC: 4.33 MIL/uL (ref 4.22–5.81)
RDW: 14.4 % (ref 11.5–15.5)
WBC: 7.1 10*3/uL (ref 4.0–10.5)

## 2016-05-10 LAB — I-STAT ARTERIAL BLOOD GAS, ED
Acid-Base Excess: 5 mmol/L — ABNORMAL HIGH (ref 0.0–2.0)
Bicarbonate: 35.4 mEq/L — ABNORMAL HIGH (ref 20.0–24.0)
O2 SAT: 79 %
PH ART: 7.235 — AB (ref 7.350–7.450)
TCO2: 38 mmol/L (ref 0–100)
pCO2 arterial: 83.5 mmHg (ref 35.0–45.0)
pO2, Arterial: 54 mmHg — ABNORMAL LOW (ref 80.0–100.0)

## 2016-05-10 LAB — I-STAT TROPONIN, ED: TROPONIN I, POC: 0.02 ng/mL (ref 0.00–0.08)

## 2016-05-10 LAB — DIGOXIN LEVEL: Digoxin Level: 0.4 ng/mL — ABNORMAL LOW (ref 0.8–2.0)

## 2016-05-10 LAB — BRAIN NATRIURETIC PEPTIDE: B NATRIURETIC PEPTIDE 5: 80.8 pg/mL (ref 0.0–100.0)

## 2016-05-10 MED ORDER — FUROSEMIDE 10 MG/ML IJ SOLN
40.0000 mg | Freq: Once | INTRAMUSCULAR | Status: AC
  Administered 2016-05-10: 40 mg via INTRAVENOUS
  Filled 2016-05-10: qty 4

## 2016-05-10 NOTE — ED Provider Notes (Signed)
CSN: [id number]     Arrival date & time 05/10/16  2232 History   First MD Initiated Contact with Patient 05/10/16 2246     Chief Complaint  Patient presents with  . Shortness of Breath     (Consider location/radiation/quality/duration/timing/severity/associated sxs/prior Treatment) Patient is a 75 y.o. male presenting with general illness. The history is provided by the patient.  Illness Location:  Shortness of breath Severity:  Severe Onset quality:  Gradual Duration:  1 day Timing:  Constant Progression:  Worsening Chronicity:  Recurrent Context:  History of COPD, lung cancer, nonischemic cardiomyopathy, EF 45%. One day of progressive worsening of shortness of breath. On arrival by EMS saturations were in the low 70s. Received bronchodilators, Solu-Medrol and route. Associated symptoms: cough, shortness of breath and wheezing   Associated symptoms: no abdominal pain, no chest pain, no diarrhea, no fever, no headaches, no nausea and no vomiting     Past Medical History  Diagnosis Date  . Hyperlipidemia   . Type II or unspecified type diabetes mellitus without mention of complication, not stated as uncontrolled     twice/week  . Chronic airway obstruction, not elsewhere classified     HFA 25-50% 07-16-2010> 100% 01-28-2011; PFT's 08-13-10 FEV1 .93(34%) ratio 37 with 23% better p B2 DLCO 69%  . Shortness of breath   . Hypertension   . Arthritis     in hands  . PAF (paroxysmal atrial fibrillation) [facility]) Nov 2014    Atrial Fibrillation; takes Cardizem, Digoxi, on Warfarin;; TEE DCCV 03/18/2014   . Lung cancer ([place]) 11/06/2013    Diagnosed during bronchoscopy  . Radiation 12/25/13-01/24/14    Right upper lobe lung  . Adenocarcinoma, lung ([place]) 07/06/2011    Followed in Pulmonary clinic/ [facility]/ [place]   - See cxr 07/06/2011 >  No change 09/24/11 > no change 03/27/2012 > ? slt progression 11/16/2012 > def progression 07/03/2013 - CT chest 08/16/13 1. Aggressive appearing right upper  lobe pulmonary mass and nodule  with a right hilar lymphadenopathy, as detailed above. This is  favored to represent a primary bronchogenic neoplasm with nodal  metastasis to the right hilum and satellite nodule in the same lobe  (i.e., T3, N1, Mx disease), however, this could alternatively  represent synchronous right upper lobe neoplasms, or less likely  an aggressive atypical infectious process such as a fungal  pneumonia (not strongly favored). Clinical correlation is strongly  recommended, with consideration for PET CT and/or biopsy at this  time.  2. In addition, there is a nonspecific 1.7 x 1.4 cm ground-glass  attenuation nodule in the left lower lobe which warrants attention  on follow-up studies - fob 08/21/13 > extrinsic compression of BI/ wang and tbbx >    Past Surgical History  Procedure Laterality Date  . Video bronchoscopy Bilateral 08/21/2013    Procedure: VIDEO BRONCHOSCOPY WITH FLUORO;  Surgeon: Tanda Rockers, MD;  Location: [facility] ENDOSCOPY;  Service: Cardiopulmonary;  Laterality: Bilateral;  . 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 Cholecystectomy  1995  . Eye surgery Bilateral     cataracts  . Video bronchoscopy with endobronchial navigation N/A 10/31/2013    Procedure: VIDEO BRONCHOSCOPY WITH ENDOBRONCHIAL NAVIGATION;  Surgeon: Collene Gobble, MD;  Location: [facility];  Service: Thoracic;  Laterality: N/A;  . Video bronchoscopy with endobronchial ultrasound N/A 10/31/2013    Procedure: VIDEO BRONCHOSCOPY WITH ENDOBRONCHIAL ULTRASOUND;  Surgeon: Collene Gobble, MD;  Location: [facility] OR;  Service: Thoracic;  Laterality: N/A;  . Transthoracic echocardiogram  10/29/2013    Mild-moderately reduced EF, 40-45%. Diffuse HK. Mild MR. Mild LA/RA dilation; no determination of elevated pulmonary arterial pressures   . Tee without cardioversion N/A 03/18/2014    Procedure: TRANSESOPHAGEAL ECHOCARDIOGRAM (TEE);  Surgeon: [name], MD;  Location[facility] ENDOSCOPY;   Service: Cardiovascular;  Laterality: N/A; -- Mild Conc LVH, EF 45-50%, dilated LA no LAA thrombus; normal valves  . Cardioversion N/A 03/18/2014    Procedure: SUCCESSFUL CARDIOVERSION;  Surgeon: [name], MD;  Location: [facility] ENDOSCOPY;  Service: Cardiovascular;  Laterality: N/A;  . Nm myoview ltd  11/2013    No ischemia or infarction   Family History  Problem Relation Age of Onset  . Diabetes Father   . Diabetes Brother   . Diabetes Sister   . Lung cancer Sister   . Liver disease Sister   . Lung cancer Daughter   . Diabetes Daughter   . Hypertension Daughter   . Hyperlipidemia Daughter   . Heart murmur Daughter   . Hyperlipidemia Daughter   . Hypertension Daughter   . Cancer Daughter     lung cancer  . Cancer Sister     lung cancer   Social History  Substance Use Topics  . Smoking status: Former Smoker -- 1.50 packs/day for 40 years    Types: Cigarettes    Quit date: 12/13/2002  . Smokeless tobacco: Current User    Types: Chew  . Alcohol Use: No    Review of Systems  Constitutional: Negative for fever and chills.  Respiratory: Positive for cough, chest tightness, shortness of breath and wheezing.   Cardiovascular: Negative for chest pain.  Gastrointestinal: Negative for nausea, vomiting, abdominal pain and diarrhea.  Musculoskeletal: Negative for back pain.  Neurological: Negative for light-headedness and headaches.  All other systems reviewed and are negative.     Allergies  Review of patient's allergies indicates no known allergies.  Home Medications   Prior to Admission medications   Medication Sig Start Date End Date Taking? Authorizing Provider  albuterol (PROVENTIL HFA;VENTOLIN HFA) 108 (90 BASE) MCG/ACT inhaler Inhale 2 puffs into the lungs every 6 (six) hours as needed for wheezing or shortness of breath. 01/23/14  Yes Tanda Rockers, MD  albuterol (PROVENTIL) (2.5 MG/3ML) 0.083% nebulizer solution Take 3 mLs (2.5 mg total) by nebulization every 6  (six) hours as needed for wheezing or shortness of breath. 03/21/80  Yes Delora Fuel, MD  budesonide-formoterol [facility]) 160-4.5 MCG/ACT inhaler Inhale 2 puffs into the lungs 2 (two) times daily.     Yes Historical Provider, MD  DIGOX 125 MCG tablet TAKE 1 TABLET BY MOUTH ONCE DAILY 05/16/15  Yes Leonie Man, MD  doxazosin (CARDURA) 2 MG tablet Take 2 mg by mouth daily.     Yes Historical Provider, MD  glimepiride (AMARYL) 2 MG tablet Take 2 mg by mouth daily. 03/09/16  Yes Historical Provider, MD  metFORMIN (GLUCOPHAGE-XR) 500 MG 24 hr tablet Take 1,000 mg by mouth 2 (two) times daily. 03/29/16  Yes Historical Provider, MD  oxyCODONE (OXY IR/ROXICODONE) 5 MG immediate release tablet Take 1 tablet po q 8 hours prn pain. Patient taking differently: Take 5 mg by mouth every 8 (eight) hours as needed for moderate pain.  04/20/16  Yes Thea Silversmith, MD  Propylene Glycol (SYSTANE BALANCE OP) Apply 1-2 drops to eye daily as needed (for dryness).   Yes Historical Provider, MD  simvastatin (  ZOCOR) 40 MG tablet Take 40 mg by mouth daily at 6 PM.  06/04/15  Yes Historical Provider, MD  warfarin (COUMADIN) 5 MG tablet TAKE 1 TABLET BY MOUTH ONCE DAILY OR AS DIRECTED BY COUMADIN CLINIC Patient taking differently: Take 5 mg by mouth daily at 6 PM. Take 7.5 mg on Sunday then 5 mg on Monday/Tuesday/Wednesday/Thursday/Friday/Saturday 12/17/15  Yes Leonie Man, MD  diltiazem (CARDIZEM) 60 MG tablet Take 1 tablet (60 mg total) by mouth 2 (two) times daily. NEEDS APPOINTMENT FOR FUTURE REFILLS Patient taking differently: Take 60 mg by mouth 2 (two) times daily.  01/30/16   Leonie Man, MD  mirtazapine (REMERON) 30 MG tablet Take 1 tablet (30 mg total) by mouth at bedtime. 10/23/15   Thea Silversmith, MD  predniSONE (DELTASONE) 10 MG tablet Take 6 tabs by mouth for 3 days, then 5 for 3 days, 4 for 3 days, 3 for 3 days, 2 for 3 days, 1 for 3 days and stop 04/13/16   Marshell Garfinkel, MD  predniSONE (DELTASONE) 50 MG  tablet Take 1 tablet (50 mg total) by mouth daily. Patient not taking: Reported on 04/26/2016 1/76/16   Delora Fuel, MD   BP 073/71 mmHg  Pulse 106  Temp(Src) 98.5 F (36.9 C) (Oral)  Resp 18  SpO2 97% Physical Exam  Constitutional: He is oriented to person, place, and time. He is easily aroused. He appears ill. He appears distressed.  HENT:  Head: Normocephalic and atraumatic.  Eyes: EOM are normal. Pupils are equal, round, and reactive to light.  Pupils 1 mm bilateral  Cardiovascular: Regular rhythm and intact distal pulses.  Tachycardia present.   Pulmonary/Chest: Accessory muscle usage present. Tachypnea noted. He is in respiratory distress. He has wheezes. He has rhonchi.  Abdominal: Soft. There is no tenderness.  Musculoskeletal: He exhibits no edema.       Right lower leg: He exhibits no swelling and no edema.       Left lower leg: He exhibits no swelling and no edema.  Neurological: He is oriented to person, place, and time and easily aroused. GCS eye subscore is 3. GCS verbal subscore is 4. GCS motor subscore is 6.  Skin: Skin is warm and dry.    ED Course  Procedures (including critical care time) Labs Review Labs Reviewed  COMPREHENSIVE METABOLIC PANEL - Abnormal; Notable for the following:    Chloride 97 (*)    CO2 35 (*)    Glucose, Bld 168 (*)    Total Protein 6.2 (*)    All other components within normal limits  CBC WITH DIFFERENTIAL/PLATELET - Abnormal; Notable for the following:    Hemoglobin 12.7 (*)    All other components within normal limits  DIGOXIN LEVEL - Abnormal; Notable for the following:    Digoxin Level 0.4 (*)    All other components within normal limits  I-STAT ARTERIAL BLOOD GAS, ED - Abnormal; Notable for the following:    pH, Arterial 7.235 (*)    pCO2 arterial 83.5 (*)    pO2, Arterial 54.0 (*)    Bicarbonate 35.4 (*)    Acid-Base Excess 5.0 (*)    All other components within normal limits  I-STAT ARTERIAL BLOOD GAS, ED - Abnormal;  Notable for the following:    pH, Arterial 7.176 (*)    pCO2 arterial 97.7 (*)    pO2, Arterial 164.0 (*)    Bicarbonate 36.1 (*)    Acid-Base Excess 4.0 (*)    All other  components within normal limits  BRAIN NATRIURETIC PEPTIDE  BLOOD GAS, ARTERIAL  I-STAT TROPOININ, ED    Imaging Review Dg Chest Port 1 View  05/10/2016  CLINICAL DATA:  Shortness of breath. Patient with history of lung cancer in radiation. EXAM: PORTABLE CHEST 1 VIEW COMPARISON:  Radiographs 04/13/2016.  CT 01/23/2016 FINDINGS: Lungs remain hyperinflated with emphysema. Right suprahilar opacity has progressed from prior radiograph. Hazy opacity in the right lower lung zone consistent with pleural effusion, likely small to moderate in size. No focal abnormality in the left lung. Heart size and mediastinal contours are unchanged. IMPRESSION: 1. Hazy opacity at the right lung base suspicious for pleural effusion, likely small to moderate in size. 2. Right suprahilar opacity, patient with history of radiation fibrosis and adjacent pulmonary nodule on CT. This appears increased in degree compared to prior radiograph, however uncertain to what extent this is secondary to differences in technique. Electronically Signed   By: Jeb Levering M.D.   On: 05/10/2016 23:25   I have personally reviewed and evaluated these images and lab results as part of my medical decision-making.   EKG Interpretation   Date/Time:  Monday May 10 2016 22:49:49 EDT Ventricular Rate:  107 PR Interval:    QRS Duration: 79 QT Interval:  242 QTC Calculation: 323 R Axis:   90 Text Interpretation:  Atrial fibrillation Probable anterior infarct, age  indeterminate Baseline wander in lead(s) V3 V5 Confirmed by ZACKOWSKI  MD,  SCOTT [phone]) on 05/10/2016 11:28:57 PM      MDM   Final diagnoses:  Acute on chronic respiratory failure with hypoxia and hypercapnia (HCC)  Acidemia  Delirium    74 red male with a history of lung cancer, CAD, CHF,  presenting with hypoxemic hypercarbic respiratory failure. Progressive worsening shortness of breath throughout the day. Initial hypoxia with EMS. Initial improvement of hypoxia with bronchodilators. Ill-appearing here. Using accessory muscles for respiration. Started on BiPAP. Initial PCO2 in the 80s. Repeat nearly 100. Intubated for elevated CO2 and hypoxemia.  Reported that the patient didn't have any fevers. No leukocytosis. Chest x-ray with no obvious pneumonia. Lower suspicion for acute infectious process at this time. Will not give antibiotics at this time. Normal BNP level; however the patient is obese and has significant bilateral lower extremity swelling his legs. Gave the patient IV Lasix.  Spoke with the intensivist. We'll admit to their service for further intervention and observation.  Maryan Puls, MD 05/11/16 [phone]

## 2016-05-10 NOTE — Progress Notes (Signed)
RT placed patient on BIPAP per MD order and ABG results. Patient placed on 20/8 and 40%. Patient tolerating well. RT will continue to monitor.

## 2016-05-10 NOTE — ED Notes (Signed)
Pt arrives via EMS from home where he lives with his family. EMS was called for Shortness of breath and edema. EMS reports pt was 74% on RA, given 125 solu-medrol, 5albuterol, 0.5 solu-medrol. 100% while on breathing treatment. EMS reports worsening lethargy en route. Reports pupils are pinpoint. Pt does admit to taking pain medication.

## 2016-05-11 ENCOUNTER — Inpatient Hospital Stay (HOSPITAL_COMMUNITY): Payer: Medicare Other

## 2016-05-11 ENCOUNTER — Emergency Department (HOSPITAL_COMMUNITY): Payer: Medicare Other

## 2016-05-11 ENCOUNTER — Telehealth: Payer: Self-pay | Admitting: Radiation Oncology

## 2016-05-11 DIAGNOSIS — Z7189 Other specified counseling: Secondary | ICD-10-CM | POA: Diagnosis not present

## 2016-05-11 DIAGNOSIS — E669 Obesity, unspecified: Secondary | ICD-10-CM | POA: Diagnosis present

## 2016-05-11 DIAGNOSIS — J189 Pneumonia, unspecified organism: Secondary | ICD-10-CM | POA: Diagnosis not present

## 2016-05-11 DIAGNOSIS — I480 Paroxysmal atrial fibrillation: Secondary | ICD-10-CM | POA: Diagnosis present

## 2016-05-11 DIAGNOSIS — J9622 Acute and chronic respiratory failure with hypercapnia: Secondary | ICD-10-CM | POA: Diagnosis not present

## 2016-05-11 DIAGNOSIS — Z85118 Personal history of other malignant neoplasm of bronchus and lung: Secondary | ICD-10-CM | POA: Diagnosis not present

## 2016-05-11 DIAGNOSIS — J9600 Acute respiratory failure, unspecified whether with hypoxia or hypercapnia: Secondary | ICD-10-CM | POA: Diagnosis present

## 2016-05-11 DIAGNOSIS — Z7984 Long term (current) use of oral hypoglycemic drugs: Secondary | ICD-10-CM | POA: Diagnosis not present

## 2016-05-11 DIAGNOSIS — T380X5A Adverse effect of glucocorticoids and synthetic analogues, initial encounter: Secondary | ICD-10-CM | POA: Diagnosis present

## 2016-05-11 DIAGNOSIS — I429 Cardiomyopathy, unspecified: Secondary | ICD-10-CM | POA: Diagnosis present

## 2016-05-11 DIAGNOSIS — J9801 Acute bronchospasm: Secondary | ICD-10-CM | POA: Diagnosis present

## 2016-05-11 DIAGNOSIS — Q211 Atrial septal defect: Secondary | ICD-10-CM | POA: Diagnosis not present

## 2016-05-11 DIAGNOSIS — E872 Acidosis, unspecified: Secondary | ICD-10-CM | POA: Insufficient documentation

## 2016-05-11 DIAGNOSIS — E1165 Type 2 diabetes mellitus with hyperglycemia: Secondary | ICD-10-CM | POA: Diagnosis present

## 2016-05-11 DIAGNOSIS — Z923 Personal history of irradiation: Secondary | ICD-10-CM | POA: Diagnosis not present

## 2016-05-11 DIAGNOSIS — J9602 Acute respiratory failure with hypercapnia: Secondary | ICD-10-CM | POA: Diagnosis not present

## 2016-05-11 DIAGNOSIS — E785 Hyperlipidemia, unspecified: Secondary | ICD-10-CM | POA: Diagnosis present

## 2016-05-11 DIAGNOSIS — Z7401 Bed confinement status: Secondary | ICD-10-CM | POA: Diagnosis not present

## 2016-05-11 DIAGNOSIS — R410 Disorientation, unspecified: Secondary | ICD-10-CM | POA: Diagnosis not present

## 2016-05-11 DIAGNOSIS — J9621 Acute and chronic respiratory failure with hypoxia: Secondary | ICD-10-CM | POA: Insufficient documentation

## 2016-05-11 DIAGNOSIS — I110 Hypertensive heart disease with heart failure: Secondary | ICD-10-CM | POA: Diagnosis present

## 2016-05-11 DIAGNOSIS — Z905 Acquired absence of kidney: Secondary | ICD-10-CM | POA: Diagnosis not present

## 2016-05-11 DIAGNOSIS — Z7901 Long term (current) use of anticoagulants: Secondary | ICD-10-CM | POA: Diagnosis not present

## 2016-05-11 DIAGNOSIS — J441 Chronic obstructive pulmonary disease with (acute) exacerbation: Secondary | ICD-10-CM | POA: Diagnosis not present

## 2016-05-11 DIAGNOSIS — Z6824 Body mass index (BMI) 24.0-24.9, adult: Secondary | ICD-10-CM | POA: Diagnosis not present

## 2016-05-11 DIAGNOSIS — Z515 Encounter for palliative care: Secondary | ICD-10-CM | POA: Diagnosis not present

## 2016-05-11 DIAGNOSIS — J9601 Acute respiratory failure with hypoxia: Secondary | ICD-10-CM | POA: Diagnosis not present

## 2016-05-11 DIAGNOSIS — Y95 Nosocomial condition: Secondary | ICD-10-CM | POA: Diagnosis present

## 2016-05-11 DIAGNOSIS — Z85528 Personal history of other malignant neoplasm of kidney: Secondary | ICD-10-CM | POA: Diagnosis not present

## 2016-05-11 DIAGNOSIS — K219 Gastro-esophageal reflux disease without esophagitis: Secondary | ICD-10-CM | POA: Diagnosis present

## 2016-05-11 DIAGNOSIS — J440 Chronic obstructive pulmonary disease with acute lower respiratory infection: Secondary | ICD-10-CM | POA: Diagnosis present

## 2016-05-11 DIAGNOSIS — D649 Anemia, unspecified: Secondary | ICD-10-CM | POA: Diagnosis present

## 2016-05-11 DIAGNOSIS — K5901 Slow transit constipation: Secondary | ICD-10-CM | POA: Diagnosis not present

## 2016-05-11 DIAGNOSIS — E874 Mixed disorder of acid-base balance: Secondary | ICD-10-CM | POA: Diagnosis present

## 2016-05-11 DIAGNOSIS — R0600 Dyspnea, unspecified: Secondary | ICD-10-CM | POA: Diagnosis not present

## 2016-05-11 DIAGNOSIS — Z7951 Long term (current) use of inhaled steroids: Secondary | ICD-10-CM | POA: Diagnosis not present

## 2016-05-11 DIAGNOSIS — R627 Adult failure to thrive: Secondary | ICD-10-CM | POA: Diagnosis not present

## 2016-05-11 DIAGNOSIS — I5022 Chronic systolic (congestive) heart failure: Secondary | ICD-10-CM | POA: Diagnosis present

## 2016-05-11 DIAGNOSIS — G9340 Encephalopathy, unspecified: Secondary | ICD-10-CM | POA: Diagnosis not present

## 2016-05-11 DIAGNOSIS — Z66 Do not resuscitate: Secondary | ICD-10-CM | POA: Diagnosis present

## 2016-05-11 DIAGNOSIS — I2510 Atherosclerotic heart disease of native coronary artery without angina pectoris: Secondary | ICD-10-CM | POA: Diagnosis present

## 2016-05-11 DIAGNOSIS — R0602 Shortness of breath: Secondary | ICD-10-CM | POA: Diagnosis present

## 2016-05-11 DIAGNOSIS — Z7952 Long term (current) use of systemic steroids: Secondary | ICD-10-CM | POA: Diagnosis not present

## 2016-05-11 DIAGNOSIS — I959 Hypotension, unspecified: Secondary | ICD-10-CM | POA: Diagnosis present

## 2016-05-11 DIAGNOSIS — G9341 Metabolic encephalopathy: Secondary | ICD-10-CM | POA: Diagnosis present

## 2016-05-11 DIAGNOSIS — Z87891 Personal history of nicotine dependence: Secondary | ICD-10-CM | POA: Diagnosis not present

## 2016-05-11 LAB — COMPREHENSIVE METABOLIC PANEL
ALT: 20 U/L (ref 17–63)
AST: 23 U/L (ref 15–41)
Albumin: 3.5 g/dL (ref 3.5–5.0)
Alkaline Phosphatase: 55 U/L (ref 38–126)
Anion gap: 7 (ref 5–15)
BUN: 12 mg/dL (ref 6–20)
CHLORIDE: 97 mmol/L — AB (ref 101–111)
CO2: 34 mmol/L — ABNORMAL HIGH (ref 22–32)
Calcium: 8.7 mg/dL — ABNORMAL LOW (ref 8.9–10.3)
Creatinine, Ser: 1 mg/dL (ref 0.61–1.24)
Glucose, Bld: 209 mg/dL — ABNORMAL HIGH (ref 65–99)
POTASSIUM: 4.8 mmol/L (ref 3.5–5.1)
Sodium: 138 mmol/L (ref 135–145)
TOTAL PROTEIN: 5.9 g/dL — AB (ref 6.5–8.1)
Total Bilirubin: 0.6 mg/dL (ref 0.3–1.2)

## 2016-05-11 LAB — I-STAT ARTERIAL BLOOD GAS, ED
ACID-BASE EXCESS: 4 mmol/L — AB (ref 0.0–2.0)
Acid-Base Excess: 4 mmol/L — ABNORMAL HIGH (ref 0.0–2.0)
BICARBONATE: 36.1 meq/L — AB (ref 20.0–24.0)
Bicarbonate: 33.5 mEq/L — ABNORMAL HIGH (ref 20.0–24.0)
O2 Saturation: 97 %
O2 Saturation: 99 %
PH ART: 7.253 — AB (ref 7.350–7.450)
PO2 ART: 164 mmHg — AB (ref 80.0–100.0)
TCO2: 36 mmol/L (ref 0–100)
TCO2: 39 mmol/L (ref 0–100)
pCO2 arterial: 75.8 mmHg (ref 35.0–45.0)
pCO2 arterial: 97.7 mmHg (ref 35.0–45.0)
pH, Arterial: 7.176 — CL (ref 7.350–7.450)
pO2, Arterial: 107 mmHg — ABNORMAL HIGH (ref 80.0–100.0)

## 2016-05-11 LAB — GLUCOSE, CAPILLARY
GLUCOSE-CAPILLARY: 211 mg/dL — AB (ref 65–99)
GLUCOSE-CAPILLARY: 129 mg/dL — AB (ref 65–99)
GLUCOSE-CAPILLARY: 201 mg/dL — AB (ref 65–99)
GLUCOSE-CAPILLARY: 199 mg/dL — AB (ref 65–99)
Glucose-Capillary: 210 mg/dL — ABNORMAL HIGH (ref 65–99)

## 2016-05-11 LAB — POCT I-STAT 3, ART BLOOD GAS (G3+)
ACID-BASE EXCESS: 4 mmol/L — AB (ref 0.0–2.0)
Bicarbonate: 31.2 mEq/L — ABNORMAL HIGH (ref 20.0–24.0)
O2 SAT: 98 %
PO2 ART: 116 mmHg — AB (ref 80.0–100.0)
Patient temperature: 99.8
TCO2: 33 mmol/L (ref 0–100)
pCO2 arterial: 57.6 mmHg (ref 35.0–45.0)
pH, Arterial: 7.345 — ABNORMAL LOW (ref 7.350–7.450)

## 2016-05-11 LAB — CBC
HEMATOCRIT: 37 % — AB (ref 39.0–52.0)
HEMOGLOBIN: 11.2 g/dL — AB (ref 13.0–17.0)
MCH: 28.9 pg (ref 26.0–34.0)
MCHC: 30.3 g/dL (ref 30.0–36.0)
MCV: 95.6 fL (ref 78.0–100.0)
Platelets: 186 10*3/uL (ref 150–400)
RBC: 3.87 MIL/uL — ABNORMAL LOW (ref 4.22–5.81)
RDW: 14.5 % (ref 11.5–15.5)
WBC: 6.3 10*3/uL (ref 4.0–10.5)

## 2016-05-11 LAB — BASIC METABOLIC PANEL
ANION GAP: 8 (ref 5–15)
BUN: 12 mg/dL (ref 6–20)
CO2: 30 mmol/L (ref 22–32)
Calcium: 8.4 mg/dL — ABNORMAL LOW (ref 8.9–10.3)
Chloride: 102 mmol/L (ref 101–111)
Creatinine, Ser: 0.89 mg/dL (ref 0.61–1.24)
Glucose, Bld: 201 mg/dL — ABNORMAL HIGH (ref 65–99)
POTASSIUM: 4.5 mmol/L (ref 3.5–5.1)
SODIUM: 140 mmol/L (ref 135–145)

## 2016-05-11 LAB — MAGNESIUM
MAGNESIUM: 1.8 mg/dL (ref 1.7–2.4)
MAGNESIUM: 2 mg/dL (ref 1.7–2.4)
Magnesium: 1.9 mg/dL (ref 1.7–2.4)

## 2016-05-11 LAB — PHOSPHORUS
PHOSPHORUS: 4 mg/dL (ref 2.5–4.6)
PHOSPHORUS: 3.8 mg/dL (ref 2.5–4.6)
PHOSPHORUS: 2.6 mg/dL (ref 2.5–4.6)

## 2016-05-11 LAB — LACTIC ACID, PLASMA
Lactic Acid, Venous: 2 mmol/L (ref 0.5–2.0)
Lactic Acid, Venous: 2.1 mmol/L (ref 0.5–2.0)

## 2016-05-11 LAB — MRSA PCR SCREENING: MRSA by PCR: NEGATIVE

## 2016-05-11 LAB — PROTIME-INR
INR: 2.54 — AB (ref 0.00–1.49)
Prothrombin Time: 27 seconds — ABNORMAL HIGH (ref 11.6–15.2)

## 2016-05-11 LAB — PROCALCITONIN: Procalcitonin: <0.1 ng/mL

## 2016-05-11 MED ORDER — VANCOMYCIN HCL 10 G IV SOLR
1500.0000 mg | Freq: Once | INTRAVENOUS | Status: AC
  Administered 2016-05-11: 1500 mg via INTRAVENOUS
  Filled 2016-05-11: qty 1500

## 2016-05-11 MED ORDER — DEXTROSE 5 % IV SOLN
1.0000 g | Freq: Three times a day (TID) | INTRAVENOUS | Status: AC
  Administered 2016-05-11 – 2016-05-16 (×18): 1 g via INTRAVENOUS
  Filled 2016-05-11 (×19): qty 1

## 2016-05-11 MED ORDER — FENTANYL CITRATE (PF) 100 MCG/2ML IJ SOLN
INTRAMUSCULAR | Status: AC
  Filled 2016-05-11: qty 2

## 2016-05-11 MED ORDER — MIDAZOLAM HCL 2 MG/2ML IJ SOLN: 1.0000 mg | INTRAMUSCULAR | Status: DC | PRN

## 2016-05-11 MED ORDER — DIGOXIN 0.05 MG/ML PO SOLN
0.1250 mg | Freq: Every day | ORAL | Status: DC
  Filled 2016-05-11: qty 2.5

## 2016-05-11 MED ORDER — METHYLPREDNISOLONE SODIUM SUCC 40 MG IJ SOLR
40.0000 mg | Freq: Four times a day (QID) | INTRAMUSCULAR | Status: DC
  Administered 2016-05-11 – 2016-05-12 (×5): 40 mg via INTRAVENOUS
  Filled 2016-05-11 (×6): qty 1

## 2016-05-11 MED ORDER — ATORVASTATIN CALCIUM 20 MG PO TABS
20.0000 mg | ORAL_TABLET | Freq: Every day | ORAL | Status: DC
  Administered 2016-05-11 – 2016-05-16 (×5): 20 mg via ORAL
  Filled 2016-05-11 (×7): qty 1

## 2016-05-11 MED ORDER — WARFARIN SODIUM 5 MG PO TABS: 5.0000 mg | ORAL_TABLET | Freq: Once | ORAL | Status: DC

## 2016-05-11 MED ORDER — MIDAZOLAM HCL 2 MG/2ML IJ SOLN
INTRAMUSCULAR | Status: AC
  Filled 2016-05-11: qty 2

## 2016-05-11 MED ORDER — SODIUM CHLORIDE 0.9 % IV SOLN
INTRAVENOUS | Status: DC
  Administered 2016-05-11 – 2016-05-12 (×2): via INTRAVENOUS

## 2016-05-11 MED ORDER — PROPOFOL 1000 MG/100ML IV EMUL
INTRAVENOUS | Status: AC
  Filled 2016-05-11: qty 100

## 2016-05-11 MED ORDER — VITAL AF 1.2 CAL PO LIQD
1000.0000 mL | ORAL | Status: DC
  Administered 2016-05-12: 1000 mL

## 2016-05-11 MED ORDER — SENNOSIDES 8.8 MG/5ML PO SYRP: 5.0000 mL | ORAL_SOLUTION | Freq: Two times a day (BID) | ORAL | Status: DC | PRN

## 2016-05-11 MED ORDER — DIGOXIN 125 MCG PO TABS
0.1250 mg | ORAL_TABLET | Freq: Every day | ORAL | Status: DC
Start: 2016-05-11 — End: 2016-05-13
  Administered 2016-05-11 – 2016-05-12 (×2): 0.125 mg
  Filled 2016-05-11 (×3): qty 1

## 2016-05-11 MED ORDER — ANTISEPTIC ORAL RINSE SOLUTION (CORINZ)
7.0000 mL | Freq: Four times a day (QID) | OROMUCOSAL | Status: DC
  Administered 2016-05-11 – 2016-05-14 (×12): 7 mL via OROMUCOSAL

## 2016-05-11 MED ORDER — SODIUM CHLORIDE 0.9 % IV SOLN
0.0000 mg/h | INTRAVENOUS | Status: DC
  Administered 2016-05-11: 0.5 mg/h via INTRAVENOUS
  Filled 2016-05-11 (×2): qty 10

## 2016-05-11 MED ORDER — PANTOPRAZOLE SODIUM 40 MG PO PACK
40.0000 mg | PACK | Freq: Every day | ORAL | Status: DC
  Administered 2016-05-11 – 2016-05-12 (×2): 40 mg
  Filled 2016-05-11 (×3): qty 20

## 2016-05-11 MED ORDER — PROPOFOL 1000 MG/100ML IV EMUL
5.0000 ug/kg/min | Freq: Once | INTRAVENOUS | Status: DC
Start: 2016-05-11 — End: 2016-05-11

## 2016-05-11 MED ORDER — SODIUM CHLORIDE 0.9 % IV BOLUS (SEPSIS)
1000.0000 mL | Freq: Once | INTRAVENOUS | Status: AC
  Administered 2016-05-11: 1000 mL via INTRAVENOUS

## 2016-05-11 MED ORDER — VANCOMYCIN HCL IN DEXTROSE 750-5 MG/150ML-% IV SOLN
750.0000 mg | Freq: Two times a day (BID) | INTRAVENOUS | Status: DC
  Administered 2016-05-11 – 2016-05-12 (×2): 750 mg via INTRAVENOUS
  Filled 2016-05-11 (×3): qty 150

## 2016-05-11 MED ORDER — INSULIN ASPART 100 UNIT/ML ~~LOC~~ SOLN
0.0000 [IU] | SUBCUTANEOUS | Status: DC
  Administered 2016-05-11: 7 [IU] via SUBCUTANEOUS
  Administered 2016-05-11: 3 [IU] via SUBCUTANEOUS
  Administered 2016-05-11: 4 [IU] via SUBCUTANEOUS
  Administered 2016-05-11 – 2016-05-12 (×4): 7 [IU] via SUBCUTANEOUS

## 2016-05-11 MED ORDER — MIDAZOLAM HCL 2 MG/2ML IJ SOLN
2.0000 mg | INTRAMUSCULAR | Status: DC | PRN
  Administered 2016-05-11: 2 mg via INTRAVENOUS

## 2016-05-11 MED ORDER — CHLORHEXIDINE GLUCONATE 0.12% ORAL RINSE (MEDLINE KIT)
15.0000 mL | Freq: Two times a day (BID) | OROMUCOSAL | Status: DC
  Administered 2016-05-11 – 2016-05-17 (×10): 15 mL via OROMUCOSAL

## 2016-05-11 MED ORDER — SUCCINYLCHOLINE CHLORIDE 20 MG/ML IJ SOLN
INTRAMUSCULAR | Status: AC | PRN
  Administered 2016-05-11: 120 mg via INTRAVENOUS

## 2016-05-11 MED ORDER — SODIUM CHLORIDE 0.9 % IV BOLUS (SEPSIS)
500.0000 mL | Freq: Once | INTRAVENOUS | Status: AC
  Administered 2016-05-11: 500 mL via INTRAVENOUS

## 2016-05-11 MED ORDER — IPRATROPIUM-ALBUTEROL 0.5-2.5 (3) MG/3ML IN SOLN
3.0000 mL | Freq: Four times a day (QID) | RESPIRATORY_TRACT | Status: DC
  Administered 2016-05-11 – 2016-05-12 (×7): 3 mL via RESPIRATORY_TRACT
  Filled 2016-05-11 (×7): qty 3

## 2016-05-11 MED ORDER — FENTANYL CITRATE (PF) 100 MCG/2ML IJ SOLN: 50.0000 ug | INTRAMUSCULAR | Status: DC | PRN

## 2016-05-11 MED ORDER — WARFARIN - PHARMACIST DOSING INPATIENT: Freq: Every day | Status: DC

## 2016-05-11 MED ORDER — ALBUTEROL SULFATE (2.5 MG/3ML) 0.083% IN NEBU
2.5000 mg | INHALATION_SOLUTION | RESPIRATORY_TRACT | Status: DC | PRN
Start: 2016-05-11 — End: 2016-05-17
  Administered 2016-05-12 – 2016-05-15 (×3): 2.5 mg via RESPIRATORY_TRACT
  Filled 2016-05-11 (×4): qty 3

## 2016-05-11 MED ORDER — SIMVASTATIN 40 MG PO TABS: 40.0000 mg | ORAL_TABLET | Freq: Every day | ORAL | Status: DC

## 2016-05-11 MED ORDER — ETOMIDATE 2 MG/ML IV SOLN
INTRAVENOUS | Status: AC | PRN
  Administered 2016-05-11: 20 mg via INTRAVENOUS

## 2016-05-11 MED ORDER — DILTIAZEM 12 MG/ML ORAL SUSPENSION
60.0000 mg | Freq: Two times a day (BID) | ORAL | Status: DC
  Administered 2016-05-11 – 2016-05-12 (×4): 60 mg
  Filled 2016-05-11 (×5): qty 6

## 2016-05-11 MED ORDER — PRO-STAT SUGAR FREE PO LIQD
30.0000 mL | Freq: Two times a day (BID) | ORAL | Status: DC
  Administered 2016-05-11: 30 mL
  Filled 2016-05-11 (×2): qty 30

## 2016-05-11 MED ORDER — PROPOFOL 1000 MG/100ML IV EMUL
5.0000 ug/kg/min | Freq: Once | INTRAVENOUS | Status: DC
  Administered 2016-05-11: via INTRAVENOUS

## 2016-05-11 MED ORDER — VITAL HIGH PROTEIN PO LIQD
1000.0000 mL | ORAL | Status: DC
  Administered 2016-05-11: 1000 mL

## 2016-05-11 MED ORDER — FENTANYL CITRATE (PF) 100 MCG/2ML IJ SOLN
50.0000 ug | INTRAMUSCULAR | Status: DC | PRN
Start: 2016-05-11 — End: 2016-05-12
  Administered 2016-05-11 (×2): 50 ug via INTRAVENOUS

## 2016-05-11 MED ORDER — BISACODYL 10 MG RE SUPP
10.0000 mg | Freq: Every day | RECTAL | Status: DC | PRN
Start: 2016-05-11 — End: 2016-05-17

## 2016-05-11 NOTE — Progress Notes (Signed)
ANTICOAGULATION CONSULT NOTE - Follow Up Consult  Pharmacy Consult for Lovenox Indication: atrial fibrillation  No Known Allergies  Patient Measurements: Height: '5\' 10"'$  (177.8 cm) Weight: 165 lb 12.6 oz (75.2 kg) IBW/kg (Calculated) : 73  Vital Signs: Temp: 100 F (37.8 C) (05/30 1211) Temp Source: Oral (05/30 1211) BP: 94/69 mmHg (05/30 1200) Pulse Rate: 68 (05/30 1200)  Labs:  Recent Labs  05/10/16 2255 05/11/16 0133 05/11/16 0422  HGB 12.7*  --  11.2*  HCT 42.1  --  37.0*  PLT 238  --  186  LABPROT  --  27.0*  --   INR  --  2.54*  --   CREATININE 0.89 1.00 0.89    Estimated Creatinine Clearance: 72.9 mL/min (by C-G formula based on Cr of 0.89).   Medications:  Prior to Admit - Coumadin 7.'5mg'$  on Sundays, '5mg'$  all other days  Assessment: 75 year old male admitted with COPD exacerbation and now requiring mechanical ventilation.  He is on chronic anticoagulation with Coumadin for atrial fibrillation and his INR is currently therapeutic. His last Coumadin dose was 5/28. CCM desires conversion to Lovenox when his INR is closer to 2.  Goal of Therapy:  Anti-Xa level 0.6-1 units/ml 4hrs after LMWH dose given Monitor platelets by anticoagulation protocol: Yes   Plan:  Check INR daily with AM labs When INR is </= 2, start Lovenox 1 mg/kg SQ q12h  CBC at least q72h Monitor for signs and symptoms of bleeding  Legrand Como, Pharm.D., BCPS, AAHIVP Clinical Pharmacist Phone: [phone] or [phone] 05/11/2016, 2:11 PM

## 2016-05-11 NOTE — Progress Notes (Signed)
ANTICOAGULATION CONSULT NOTE - Initial Consult  Pharmacy Consult for Coumadin Indication: atrial fibrillation  No Known Allergies  Patient Measurements: Weight: 158 lb 11.7 oz (72 kg)  Vital Signs: Temp: 98.5 F (36.9 C) (05/29 2252) Temp Source: Oral (05/29 2252) BP: 125/84 mmHg (05/30 0245) Pulse Rate: 112 (05/30 0225)  Labs:  Recent Labs  05/10/16 2255 05/11/16 0133  HGB 12.7*  --   HCT 42.1  --   PLT 238  --   LABPROT  --  27.0*  INR  --  2.54*  CREATININE 0.89 1.00    Estimated Creatinine Clearance: 56.7 mL/min (by C-G formula based on Cr of 1).   Medical History: Past Medical History  Diagnosis Date  . Hyperlipidemia   . Type II or unspecified type diabetes mellitus without mention of complication, not stated as uncontrolled     twice/week  . Chronic airway obstruction, not elsewhere classified     HFA 25-50% 07-16-2010> 100% 01-28-2011; PFT's 08-13-10 FEV1 .93(34%) ratio 37 with 23% better p B2 DLCO 69%  . Shortness of breath   . Hypertension   . Arthritis     in hands  . PAF (paroxysmal atrial fibrillation) [facility]) Nov 2014    Atrial Fibrillation; takes Cardizem, Digoxi, on Warfarin;; TEE DCCV 03/18/2014   . Lung cancer ([place]) 11/06/2013    Diagnosed during bronchoscopy  . Radiation 12/25/13-01/24/14    Right upper lobe lung  . 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 slt progression 11/16/2012 > def progression 07/03/2013 - CT chest 08/16/13 1. Aggressive appearing right upper lobe pulmonary mass and nodule  with a right hilar lymphadenopathy, as detailed above. This is  favored to represent a primary bronchogenic neoplasm with nodal  metastasis to the right hilum and satellite nodule in the same lobe  (i.e., T3, N1, Mx disease), however, this could alternatively  represent synchronous right upper lobe neoplasms, or less likely  an aggressive  atypical infectious process such as a fungal  pneumonia (not strongly favored). Clinical correlation is strongly  recommended, with consideration for PET CT and/or biopsy at this  time.  2. In addition, there is a nonspecific 1.7 x 1.4 cm ground-glass  attenuation nodule in the left lower lobe which warrants attention  on follow-up studies - fob 08/21/13 > extrinsic compression of BI/ wang and tbbx >     Medications:  No current facility-administered medications on file prior to encounter.   Current Outpatient Prescriptions on File Prior to Encounter  Medication Sig Dispense Refill  . albuterol (PROVENTIL HFA;VENTOLIN HFA) 108 (90 BASE) MCG/ACT inhaler Inhale 2 puffs into the lungs every 6 (six) hours as needed for wheezing or shortness of breath. 1 Inhaler 6  . albuterol (PROVENTIL) (2.5 MG/3ML) 0.083% nebulizer solution Take 3 mLs (2.5 mg total) by nebulization every 6 (six) hours as needed for wheezing or shortness of breath. 75 mL 0  . budesonide-formoterol (SYMBICORT) 160-4.5 MCG/ACT inhaler Inhale 2 puffs into the lungs 2 (two) times daily.      Marland Kitchen DIGOX 125 MCG tablet TAKE 1 TABLET BY MOUTH ONCE DAILY 90 tablet 3  . doxazosin (CARDURA) 2 MG tablet Take 2 mg by mouth daily.      Marland Kitchen glimepiride (AMARYL) 2 MG tablet Take 2 mg by mouth daily.    . metFORMIN (GLUCOPHAGE-XR) 500 MG 24 hr tablet Take 1,000 mg  by mouth 2 (two) times daily.    Marland Kitchen oxyCODONE (OXY IR/ROXICODONE) 5 MG immediate release tablet Take 1 tablet po q 8 hours prn pain. (Patient taking differently: Take 5 mg by mouth every 8 (eight) hours as needed for moderate pain. ) 90 tablet 0  . simvastatin (ZOCOR) 40 MG tablet Take 40 mg by mouth daily at 6 PM.     . warfarin (COUMADIN) 5 MG tablet TAKE 1 TABLET BY MOUTH ONCE DAILY OR AS DIRECTED BY COUMADIN CLINIC (Patient taking differently: Take 5 mg by mouth daily at 6 PM. Take 7.5 mg on Sunday then 5 mg on Monday/Tuesday/Wednesday/Thursday/Friday/Saturday) 90 tablet 1  . diltiazem  (CARDIZEM) 60 MG tablet Take 1 tablet (60 mg total) by mouth 2 (two) times daily. NEEDS APPOINTMENT FOR FUTURE REFILLS (Patient taking differently: Take 60 mg by mouth 2 (two) times daily. ) 60 tablet 0  . mirtazapine (REMERON) 30 MG tablet Take 1 tablet (30 mg total) by mouth at bedtime. 30 tablet 1     Assessment: 75 y.o. male admitted with SOB/VDRF, h/o Afib, to continue Coumadin  Goal of Therapy:  INR 2-3 Monitor platelets by anticoagulation protocol: Yes   Plan:  Coumadin 5 mg this evening Daily INR  Nataliya Graig, [place] 05/11/2016,3:37 AM

## 2016-05-11 NOTE — ED Notes (Signed)
MD Verlene Mayer informed of patient's post propofol hypotension. MD advised this RN to give fluid bolus, and utilize versed and fentanyl for sedation vs propofol.

## 2016-05-11 NOTE — Progress Notes (Addendum)
Initial Nutrition Assessment  DOCUMENTATION CODES:   Not applicable  INTERVENTION:    Initiate TF via OGT with Vital AF 1.2 at goal rate of 60 ml/h (1440 ml per day) to provide 1728 kcals, 108 gm protein, 1168 ml free water daily.  NUTRITION DIAGNOSIS:   Inadequate oral intake related to inability to eat as evidenced by NPO status.  GOAL:   Patient will meet greater than or equal to 90% of their needs  MONITOR:   Vent status, TF tolerance, Labs, Weight trends, I & O's  REASON FOR ASSESSMENT:   Consult Enteral/tube feeding initiation and management  ASSESSMENT:   75 yo male brought to ER with progressive dyspnea and hypoxia. Admitted with acute on chronic hypoxic/hypercapnic respiratory failure 2nd to HCAP, AECOPD.  Patient is currently intubated on ventilator support MV: 9.4 L/min Temp (24hrs), Avg:99.4 F (37.4 C), Min:98.5 F (36.9 C), Max:100 F (37.8 C)  Received MD Consult for TF initiation and management. Nutrition-Focused physical exam completed. Findings are no fat depletion, mild muscle depletion, and severe edema.  Diet Order:  Diet NPO time specified  Skin:  Wound (see comment) (DTI to sacrum)  Last BM:  5/29  Height:   Ht Readings from Last 1 Encounters:  05/11/16 '5\' 10"'$  (1.778 m)    Weight:   Wt Readings from Last 1 Encounters:  05/11/16 165 lb 12.6 oz (75.2 kg)    Ideal Body Weight:  75.5 kg  BMI:  Body mass index is 23.79 kg/(m^2).  Estimated Nutritional Needs:   Kcal:  5465  Protein:  105-120 gm  Fluid:  1.8-2 L  EDUCATION NEEDS:   No education needs identified at this time  Molli Barrows, [place], [place], [place] Pager [phone] After Hours Pager [phone]

## 2016-05-11 NOTE — Sedation Documentation (Signed)
MD Sood at Bedside.

## 2016-05-11 NOTE — Progress Notes (Signed)
Pharmacy Antibiotic Note  Brent Wade is a 75 y.o. male admitted on 05/10/2016 with SOB/VDRF, likely HCAP.  Pharmacy has been consulted for Vancomycin and Fortaz dosing.  Plan: Vancomycin 1500 mg IV now, then 750 mg IV q12h Fortaz 1 g IV q8h   Weight: 158 lb 11.7 oz (72 kg)  Temp (24hrs), Avg:98.5 F (36.9 C), Min:98.5 F (36.9 C), Max:98.5 F (36.9 C)   Recent Labs Lab 05/10/16 2255  WBC 7.1  CREATININE 0.89    Estimated Creatinine Clearance: 63.7 mL/min (by C-G formula based on Cr of 0.89).    No Known Allergies   Caryl Pina 05/11/2016 1:14 AM

## 2016-05-11 NOTE — Progress Notes (Signed)
Inpatient Diabetes Program Recommendations  AACE/ADA: New Consensus Statement on Inpatient Glycemic Control (2015)  Target Ranges:  Prepandial:   less than 140 mg/dL      Peak postprandial:   less than 180 mg/dL (1-2 hours)      Critically ill patients:  140 - 180 mg/dL   Review of Glycemic Control:  Results for ANIKET, PAYE (MRN [id number]) as of 05/11/2016 13:25  Ref. Range 05/11/2016 04:26 05/11/2016 08:13 05/11/2016 11:39  Glucose-Capillary Latest Ref Range: 65-99 mg/dL 211 (H) 210 (H) 129 (H)   Diabetes history: Type 2 diabetes Outpatient Diabetes medications:  Metformin 1000 mg bid, Amaryl 2 mg daily Current orders for Inpatient glycemic control:  Novolog resistant q 4 hours.  Inpatient Diabetes Program Recommendations:   Please consider changing to "ICU glycemic control" order set standard correction.  Therefore if blood sugars greater than goal, RN can start insulin drip as indicated per order set.  Thanks, Adah Perl, RN, BC-ADM Inpatient Diabetes Coordinator Pager [phone] (8a-5p)

## 2016-05-11 NOTE — Progress Notes (Addendum)
PULMONARY / CRITICAL CARE MEDICINE   Name: Brent Wade MRN: [id number] DOB: Oct 24, 1941    ADMISSION DATE:  05/10/2016  REFERRING MD:  Dr. Rogene Houston  CHIEF COMPLAINT:  Short of breath  HISTORY OF PRESENT ILLNESS:   Hx from chart.  75 yo male brought to ER with progressive dyspnea.  He was also hypoxic.  He took pain medication earlier in the day.  He was found to be lethargic and had respiratory acidosis on ABG.  He was tried on BiPAP but continued to get worse.  He required intubation.  He has hx of GOLD D COPD, NSCLC s/p XRT, systolic CHF, PAF on coumadin.  He was in hospital recently for AECOPD.   SUBJECTIVE:   VITAL SIGNS: BP 108/81 mmHg  Pulse 91  Temp(Src) 99.4 F (37.4 C) (Oral)  Resp 18  Ht '5\' 10"'$  (1.778 m)  Wt 165 lb 12.6 oz (75.2 kg)  BMI 23.79 kg/m2  SpO2 100%  HEMODYNAMICS:    VENTILATOR SETTINGS: Vent Mode:  [-] PRVC FiO2 (%):  [40 %] 40 % Set Rate:  [15 bmp-18 bmp] 18 bmp Vt Set:  [550 mL] 550 mL PEEP:  [5 cmH20-8 cmH20] 5 cmH20 Plateau Pressure:  [16 cmH20-20 cmH20] 18 cmH20  INTAKE / OUTPUT: I/O last 3 completed shifts: In: 794 [I.V.:214; NG/GT:30; IV Piggyback:550] Out: 0   PHYSICAL EXAMINATION: General:  Ill appearing; sedated on vent Neuro:  Sedated; moves all ext HEENT:  Pupils pinpoint, ETT in place Cardiovascular:  Irregular no MRG Lungs:  Prolonged exhalation, faint b/l wheeze with scattered crackles Abdomen:  Soft, non tender Musculoskeletal:  3+ edema Skin:  No rashes  LABS:  BMET  Recent Labs Lab 05/10/16 2255 05/11/16 0133 05/11/16 0422  NA 138 138 140  K 4.4 4.8 4.5  CL 97* 97* 102  CO2 35* 34* 30  BUN '10 12 12  '$ CREATININE 0.89 1.00 0.89  GLUCOSE 168* 209* 201*    Electrolytes  Recent Labs Lab 05/10/16 2255 05/11/16 0133 05/11/16 0422  CALCIUM 9.1 8.7* 8.4*  MG  --  1.9 1.8  PHOS  --  4.0 3.8    CBC  Recent Labs Lab 05/10/16 2255 05/11/16 0422  WBC 7.1 6.3  HGB 12.7* 11.2*  HCT 42.1 37.0*   PLT 238 186    Coag's  Recent Labs Lab 05/11/16 0133  INR 2.54*    Sepsis Markers  Recent Labs Lab 05/11/16 0133 05/11/16 0427  LATICACIDVEN 2.0 2.1*  PROCALCITON <0.10  --     ABG  Recent Labs Lab 05/10/16 2358 05/11/16 0152 05/11/16 0448  PHART 7.176* 7.253* 7.345*  PCO2ART 97.7* 75.8* 57.6*  PO2ART 164.0* 107.0* 116.0*    Liver Enzymes  Recent Labs Lab 05/10/16 2255 05/11/16 0133  AST 19 23  ALT 20 20  ALKPHOS 58 55  BILITOT 0.4 0.6  ALBUMIN 3.8 3.5    Cardiac Enzymes No results for input(s): TROPONINI, PROBNP in the last 168 hours.  Glucose  Recent Labs Lab 05/11/16 0426 05/11/16 0813  GLUCAP 211* 210*    Imaging Dg Chest Portable 1 View  05/11/2016  CLINICAL DATA:  Endotracheal tube repositioning.  Initial encounter. EXAM: PORTABLE CHEST 1 VIEW COMPARISON:  Chest radiograph performed earlier today at 12:23 a.m. FINDINGS: The patient's endotracheal tube is seen ending 3-4 cm above the carina. An enteric tube is noted extending below the diaphragm. 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 Right basilar airspace opacity could reflect mild infection. The left lung appears grossly clear.  No pleural effusion or pneumothorax is seen. The cardiomediastinal silhouette is normal in size. No acute osseous abnormalities are identified. There is chronic superior subluxation of the left humeral head, reflecting a chronic left-sided rotator cuff tear. IMPRESSION: 1. Endotracheal tube seen ending 3-4 cm above the carina. 2. Right-sided radiation fibrosis again noted. Right basilar airspace opacity could reflect mild infection. Electronically Signed   By: Garald Balding M.D.   On: 05/11/2016 02:32   Dg Chest Port 1 View  05/11/2016  CLINICAL DATA:  Intubation. EXAM: PORTABLE CHEST 1 VIEW COMPARISON:  Yesterday at 2257 hour FINDINGS: Endotracheal tube 7.5 cm from the carina. Enteric tube is in place, tip below the diaphragm, side-port not well visualized.  Improved lung aeration from pre intubation exam. Persistent right basilar hazy opacity likely pleural effusion. The right upper lobe opacity has diminished in the interim. Left basilar subsegmental atelectasis. Cardiomediastinal contours are unchanged. IMPRESSION: 1. Endotracheal tube 7.5 cm from the carina. Enteric tube in place, tip below the diaphragm. 2. Improved lung aeration from pre intubation exam. Right pleural effusion again seen. Right suprahilar opacity with question mild decrease in the interim versus differences in technique. Electronically Signed   By: Jeb Levering M.D.   On: 05/11/2016 00:36   Dg Chest Port 1 View  05/10/2016  CLINICAL DATA:  Shortness of breath. Patient with history of lung cancer in radiation. EXAM: PORTABLE CHEST 1 VIEW COMPARISON:  Radiographs 04/13/2016.  CT 01/23/2016 FINDINGS: Lungs remain hyperinflated with emphysema. Right suprahilar opacity has progressed from prior radiograph. Hazy opacity in the right lower lung zone consistent with pleural effusion, likely small to moderate in size. No focal abnormality in the left lung. Heart size and mediastinal contours are unchanged. IMPRESSION: 1. Hazy opacity at the right lung base suspicious for pleural effusion, likely small to moderate in size. 2. Right suprahilar opacity, patient with history of radiation fibrosis and adjacent pulmonary nodule on CT. This appears increased in degree compared to prior radiograph, however uncertain to what extent this is secondary to differences in technique. Electronically Signed   By: Jeb Levering M.D.   On: 05/10/2016 23:25  no sig change in aeration   STUDIES:  03/27/12 PFT >> FEV1 1.08 (40%), FEV1% 40, TLC 7.16 (119%), DLCO 67%, +BD  CULTURES: 5/30 Blood >> 5/30 Sputum >>  ANTIBIOTICS: 5/30 Fortaz >> 5/30 Vancomycin >>   SIGNIFICANT EVENTS: 5/29 Admit  LINES/TUBES: 5/30 ETT >>   DISCUSSION: 75 yo male with AECOPD, VDRF, HCAP.  Hx of NSCLC s/p XRT, systolic CHF,  PAF. Still w/ bronchospasm. Very sedated. Will try to stop versed gtt in hopes of weaning attempts. He may tolerate ventilation better if he can set his own Ve.     ASSESSMENT / PLAN:  PULMONARY A: Acute on chronic hypoxic/hypercapnic respiratory failure 2nd to HCAP, AECOPD. -->no sig change in CXR. Still w/ some air trapping on controlled ventilation mode. He is heavily sedated.  P:   Full vent support  He is oversedated. RAS goal is -1 and he is -3. Will dc versed gtt. Hope this can facilitate weaning.  Monitor for PEEPi >> allow for permissive hypercapnia Scheduled BDs Solumedrol at current CXR PRN   CARDIOVASCULAR A:  Hx of PAF, HTN, HLD, chronic systolic CHF. P:  Coumadin per pharmacy Continue cardizem, digoxin, zocor Monitor hemodynamics  RENAL A:   Hx of Lt nephrectomy. P:   Monitor renal fx, urine outpt, electrolytes  GASTROINTESTINAL A:   Nutrition. P:   Tube feeds  Protonix for SUP  HEMATOLOGIC A:   Hx of NSCLC (adenocarcinoma) s/p XRT. Coumadin at home ( fib) P:  F/u CBC Consider change to Lovenox with cancer on vent  INFECTIOUS A:   HCAP >> has progression of RUL ASD. P:   Continue Abx Check procalcitonin, lactic acid  ENDOCRINE A:   Hx of DM with steroid induced hyperglycemia.  P:   SSI Hold amaryl, glucophage  NEUROLOGIC A:   Acute metabolic encephalopathy. P:   RASS goal: -1 Hold outpt remeron, oxycodone Dc versed gtt. Will try PRN fent first; if can't tolerate then we will place on precedex  No family at bedside.  20 minutes PCCM time   Erick Colace ACNP-BC [place] Pager # [phone] OR # [phone] if no answer   STAFF NOTE: I, Merrie Roof, MD FACP have personally reviewed patient's available data, including medical history, events of note, physical examination and test results as part of my evaluation. I have discussed with resident/NP and other care providers such as pharmacist, RN and RRT.  In addition, I personally evaluated patient and elicited key findings of: Improved bronchospasm, equal BS, deep sedation rass, dc versed ASAP, WUA, avoid HIGH rates vent, Bders, Steroids to remain, I reviewed last CT febb, concern progression, low threshold for CT chest , will need on vent if not progressing, if progresses off vent can do pre dc, ABX remain, on vetn cancer would prefer lovenox,  coumadin, follow renal fxn closely, unclear if he is infected, will follow pct and dc if able, assess coags in am then add loveneox when ine less 2 The patient is critically ill with multiple organ systems failure and requires high complexity decision making for assessment and support, frequent evaluation and titration of therapies, application of advanced monitoring technologies and extensive interpretation of multiple databases.   Critical Care Time devoted to patient care services described in this note is 45 Minutes. This time reflects time of care of this signee: Merrie Roof, MD FACP. This critical care time does not reflect procedure time, or teaching time or supervisory time of PA/NP/Med student/Med Resident etc but could involve care discussion time. Rest per NP/medical resident whose note is outlined above and that I agree with   Lavon Paganini. Titus Mould, MD, [place] Pgr: [facility] 05/11/2016 11:35 AM

## 2016-05-11 NOTE — Progress Notes (Signed)
[facility] Progress Note Patient Name: Brent Wade DOB: 02-09-1941 MRN: [id number]   Date of Service  05/11/2016  HPI/Events of Note  Hypotension - BP = 98/82. Likely related to Propofol and blowing off CO2 post intubation.   eICU Interventions  Will order: 1. 0.9 NaCl 1 liter IV over 1 hour now.  2. Versed IV infusion. Titrate to RASS = 0 to -1.     Intervention Category Major Interventions: Hypotension - evaluation and management  Tarin Navarez [place] 05/11/2016, 1:49 AM

## 2016-05-11 NOTE — Consult Note (Addendum)
WOC wound consult note Reason for Consult: Consult requested for buttocks/sacrum. Pt was noted to have a dark purple area on admission.  This appears to be receeding at this time. Wound type: Deep tissue injury Pressure Ulcer POA: Yes Measurement: 2X2cm dark purple-reddish area to inner buttocks/sacrum Dressing procedure/placement/frequency: Foam dressing to protect and promote healing.  Pt is on a Sport low-ariloss bed to reduce pressure. Please re-consult if further assistance is needed.  Thank-you,  Julien Girt MSN, [place], [place], [place], [place]

## 2016-05-11 NOTE — ED Provider Notes (Signed)
I saw and evaluated the patient, reviewed the resident's note and I agree with the findings and plan.   EKG Interpretation   Date/Time:  Monday May 10 2016 22:49:49 EDT Ventricular Rate:  107 PR Interval:    QRS Duration: 79 QT Interval:  242 QTC Calculation: 323 R Axis:   90 Text Interpretation:  Atrial fibrillation Probable anterior infarct, age  indeterminate Baseline wander in lead(s) V3 V5 Confirmed by Shantell Belongia  MD,  Warrick Llera [phone]) on 05/10/2016 11:28:57 PM      Results for orders placed or performed during the hospital encounter of 05/10/16  Comprehensive metabolic panel  Result Value Ref Range   Sodium 138 135 - 145 mmol/L   Potassium 4.4 3.5 - 5.1 mmol/L   Chloride 97 (L) 101 - 111 mmol/L   CO2 35 (H) 22 - 32 mmol/L   Glucose, Bld 168 (H) 65 - 99 mg/dL   BUN 10 6 - 20 mg/dL   Creatinine, Ser 0.89 0.61 - 1.24 mg/dL   Calcium 9.1 8.9 - 10.3 mg/dL   Total Protein 6.2 (L) 6.5 - 8.1 g/dL   Albumin 3.8 3.5 - 5.0 g/dL   AST 19 15 - 41 U/L   ALT 20 17 - 63 U/L   Alkaline Phosphatase 58 38 - 126 U/L   Total Bilirubin 0.4 0.3 - 1.2 mg/dL   GFR calc non Af Amer >60 >60 mL/min   GFR calc Af Amer >60 >60 mL/min   Anion gap 6 5 - 15  CBC WITH DIFFERENTIAL  Result Value Ref Range   WBC 7.1 4.0 - 10.5 K/uL   RBC 4.33 4.22 - 5.81 MIL/uL   Hemoglobin 12.7 (L) 13.0 - 17.0 g/dL   HCT 42.1 39.0 - 52.0 %   MCV 97.2 78.0 - 100.0 fL   MCH 29.3 26.0 - 34.0 pg   MCHC 30.2 30.0 - 36.0 g/dL   RDW 14.4 11.5 - 15.5 %   Platelets 238 150 - 400 K/uL   Neutrophils Relative % 75 %   Neutro Abs 5.3 1.7 - 7.7 K/uL   Lymphocytes Relative 14 %   Lymphs Abs 1.0 0.7 - 4.0 K/uL   Monocytes Relative 11 %   Monocytes Absolute 0.7 0.1 - 1.0 K/uL   Eosinophils Relative 0 %   Eosinophils Absolute 0.0 0.0 - 0.7 K/uL   Basophils Relative 0 %   Basophils Absolute 0.0 0.0 - 0.1 K/uL  Brain natriuretic peptide  Result Value Ref Range   B Natriuretic Peptide 80.8 0.0 - 100.0 pg/mL  Digoxin level   Result Value Ref Range   Digoxin Level 0.4 (L) 0.8 - 2.0 ng/mL  I-stat troponin, ED  (not at [facility])  Result Value Ref Range   Troponin i, poc 0.02 0.00 - 0.08 ng/mL   Comment 3          I-Stat arterial blood gas, ED  ([facility], [facility])  Result Value Ref Range   pH, Arterial 7.235 (L) 7.350 - 7.450   pCO2 arterial 83.5 (HH) 35.0 - 45.0 mmHg   pO2, Arterial 54.0 (L) 80.0 - 100.0 mmHg   Bicarbonate 35.4 (H) 20.0 - 24.0 mEq/L   TCO2 38 0 - 100 mmol/L   O2 Saturation 79.0 %   Acid-Base Excess 5.0 (H) 0.0 - 2.0 mmol/L   Patient temperature 98.6 F    Collection site RADIAL, ALLEN'S TEST ACCEPTABLE    Drawn by RT    Sample type ARTERIAL    Comment NOTIFIED PHYSICIAN  I-Stat arterial blood gas, ED  Result Value Ref Range   pH, Arterial 7.176 (LL) 7.350 - 7.450   pCO2 arterial 97.7 (HH) 35.0 - 45.0 mmHg   pO2, Arterial 164.0 (H) 80.0 - 100.0 mmHg   Bicarbonate 36.1 (H) 20.0 - 24.0 mEq/L   TCO2 39 0 - 100 mmol/L   O2 Saturation 99.0 %   Acid-Base Excess 4.0 (H) 0.0 - 2.0 mmol/L   Patient temperature 98.6 F    Collection site RADIAL, ALLEN'S TEST ACCEPTABLE    Drawn by RT    Sample type ARTERIAL    Comment NOTIFIED PHYSICIAN    Dg Chest 2 View  04/13/2016  CLINICAL DATA:  Shortness of breath. Status post right upper lobe radiation for lung cancer. Ex-smoker. EXAM: CHEST  2 VIEW COMPARISON:  03/29/2016. FINDINGS: The heart remains normal in size. The lungs remain hyperexpanded. Stable scarring in the right upper lobe. Thoracic spine degenerative changes and central peribronchial thickening. Upper abdominal surgical clips. Stable upper thoracic vertebral compression deformity with sclerosis and no visible retropulsion. IMPRESSION: Stable changes of COPD and chronic bronchitis and right upper lobe scarring. No acute abnormality. Electronically Signed   By: Claudie Revering M.D.   On: 04/13/2016 17:39   Dg Chest Port 1 View  05/11/2016  CLINICAL DATA:  Intubation. EXAM: PORTABLE CHEST 1 VIEW  COMPARISON:  Yesterday at 2257 hour FINDINGS: Endotracheal tube 7.5 cm from the carina. Enteric tube is in place, tip below the diaphragm, side-port not well visualized. Improved lung aeration from pre intubation exam. Persistent right basilar hazy opacity likely pleural effusion. The right upper lobe opacity has diminished in the interim. Left basilar subsegmental atelectasis. Cardiomediastinal contours are unchanged. IMPRESSION: 1. Endotracheal tube 7.5 cm from the carina. Enteric tube in place, tip below the diaphragm. 2. Improved lung aeration from pre intubation exam. Right pleural effusion again seen. Right suprahilar opacity with question mild decrease in the interim versus differences in technique. Electronically Signed   By: Jeb Levering M.D.   On: 05/11/2016 00:36   Dg Chest Port 1 View  05/10/2016  CLINICAL DATA:  Shortness of breath. Patient with history of lung cancer in radiation. EXAM: PORTABLE CHEST 1 VIEW COMPARISON:  Radiographs 04/13/2016.  CT 01/23/2016 FINDINGS: Lungs remain hyperinflated with emphysema. Right suprahilar opacity has progressed from prior radiograph. Hazy opacity in the right lower lung zone consistent with pleural effusion, likely small to moderate in size. No focal abnormality in the left lung. Heart size and mediastinal contours are unchanged. IMPRESSION: 1. Hazy opacity at the right lung base suspicious for pleural effusion, likely small to moderate in size. 2. Right suprahilar opacity, patient with history of radiation fibrosis and adjacent pulmonary nodule on CT. This appears increased in degree compared to prior radiograph, however uncertain to what extent this is secondary to differences in technique. Electronically Signed   By: Jeb Levering M.D.   On: 05/10/2016 23:25    CRITICAL CARE Performed by: Fredia Sorrow Total critical care time: 30 minutes Critical care time was exclusive of separately billable procedures and treating other patients. Critical  care was necessary to treat or prevent imminent or life-threatening deterioration. Critical care was time spent personally by me on the following activities: development of treatment plan with patient and/or surrogate as well as nursing, discussions with consultants, evaluation of patient's response to treatment, examination of patient, obtaining history from patient or surrogate, ordering and performing treatments and interventions, ordering and review of laboratory studies, ordering and review of radiographic  studies, pulse oximetry and re-evaluation of patient's condition.   Patient arrived in respiratory distress. Patient with history of COPD. Probably COPD exacerbation. Patient also had altered mental status. Blood gas showed marked elevation and not carbon dioxide. Patient was tried on BiPAP carbon dioxide x-ray went higher. So patient was intubated. Patient will be admitted I critical care. Chest x-ray postintubation showed the endotracheal tube in the right place. There is a right pleural effusion was seen. Also there was a right suprahilar opacity not seem to be consistent with pneumonia at this time.  Patient did not have wheezing was working hard to breathe prior to BiPAP. After intubation air movement was very slow to exhale consistent with retention.   Fredia Sorrow, MD 05/11/16 [phone]

## 2016-05-11 NOTE — ED Notes (Signed)
Attempted Report 

## 2016-05-11 NOTE — Telephone Encounter (Signed)
Wanted to know if patient would like to come in sooner per Dr Pablo Ledger, LVM, 5/30

## 2016-05-11 NOTE — H&P (Signed)
PULMONARY / CRITICAL CARE MEDICINE   Name: Brent Wade MRN: [id number] DOB: 05/18/1941    ADMISSION DATE:  05/10/2016  REFERRING MD:  Dr. Rogene Houston  CHIEF COMPLAINT:  Short of breath  HISTORY OF PRESENT ILLNESS:   Hx from chart.  75 yo male brought to ER with progressive dyspnea.  He was also hypoxic.  He took pain medication earlier in the day.  He was found to be lethargic and had respiratory acidosis on ABG.  He was tried on BiPAP but continued to get worse.  He required intubation.  He has hx of GOLD D COPD, NSCLC s/p XRT, systolic CHF, PAF on coumadin.  He was in hospital recently for AECOPD.  PAST MEDICAL HISTORY :  He  has a past medical history of Hyperlipidemia; Type II or unspecified type diabetes mellitus without mention of complication, not stated as uncontrolled; Chronic airway obstruction, not elsewhere classified; Shortness of breath; Hypertension; Arthritis; PAF (paroxysmal atrial fibrillation) [facility]) (Nov 2014); Lung cancer ([place]) (11/06/2013); Radiation (12/25/13-01/24/14); and Adenocarcinoma, lung ([place]) (07/06/2011).  PAST SURGICAL HISTORY: He  has past surgical history that includes Video bronchoscopy (Bilateral, 08/21/2013); Nephrectomy (Left, 1995); Cholecystectomy (1995); Eye surgery (Bilateral); Video bronchoscopy with endobronchial navigation (N/A, 10/31/2013); Video bronchoscopy with endobronchial ultrasound (N/A, 10/31/2013); transthoracic echocardiogram (10/29/2013); TEE without cardioversion (N/A, 03/18/2014); Cardioversion (N/A, 03/18/2014); and [facility] (11/2013).  No Known Allergies  No current facility-administered medications on file prior to encounter.   Current Outpatient Prescriptions on File Prior to Encounter  Medication Sig  . albuterol (PROVENTIL HFA;VENTOLIN HFA) 108 (90 BASE) MCG/ACT inhaler Inhale 2 puffs into the lungs every 6 (six) hours as needed for wheezing or shortness of breath.  Marland Kitchen albuterol (PROVENTIL) (2.5 MG/3ML) 0.083% nebulizer  solution Take 3 mLs (2.5 mg total) by nebulization every 6 (six) hours as needed for wheezing or shortness of breath.  . budesonide-formoterol (SYMBICORT) 160-4.5 MCG/ACT inhaler Inhale 2 puffs into the lungs 2 (two) times daily.    Marland Kitchen DIGOX 125 MCG tablet TAKE 1 TABLET BY MOUTH ONCE DAILY  . doxazosin (CARDURA) 2 MG tablet Take 2 mg by mouth daily.    Marland Kitchen glimepiride (AMARYL) 2 MG tablet Take 2 mg by mouth daily.  . metFORMIN (GLUCOPHAGE-XR) 500 MG 24 hr tablet Take 1,000 mg by mouth 2 (two) times daily.  Marland Kitchen oxyCODONE (OXY IR/ROXICODONE) 5 MG immediate release tablet Take 1 tablet po q 8 hours prn pain. (Patient taking differently: Take 5 mg by mouth every 8 (eight) hours as needed for moderate pain. )  . simvastatin (ZOCOR) 40 MG tablet Take 40 mg by mouth daily at 6 PM.   . warfarin (COUMADIN) 5 MG tablet TAKE 1 TABLET BY MOUTH ONCE DAILY OR AS DIRECTED BY COUMADIN CLINIC (Patient taking differently: Take 5 mg by mouth daily at 6 PM. Take 7.5 mg on Sunday then 5 mg on Monday/Tuesday/Wednesday/Thursday/Friday/Saturday)  . diltiazem (CARDIZEM) 60 MG tablet Take 1 tablet (60 mg total) by mouth 2 (two) times daily. NEEDS APPOINTMENT FOR FUTURE REFILLS (Patient taking differently: Take 60 mg by mouth 2 (two) times daily. )  . mirtazapine (REMERON) 30 MG tablet Take 1 tablet (30 mg total) by mouth at bedtime.  . predniSONE (DELTASONE) 10 MG tablet Take 6 tabs by mouth for 3 days, then 5 for 3 days, 4 for 3 days, 3 for 3 days, 2 for 3 days, 1 for 3 days and stop  . predniSONE (DELTASONE) 50 MG tablet Take 1 tablet (50 mg total) by  mouth daily. (Patient not taking: Reported on 04/26/2016)    FAMILY HISTORY:  His indicated that his mother is deceased. He indicated that his father is deceased. He indicated that all of his three sisters are deceased. He indicated that both of his brothers are deceased. He indicated that two of his three daughters are alive.   SOCIAL HISTORY: He  reports that he quit smoking  about 13 years ago. His smoking use included Cigarettes. He has a 60 pack-year smoking history. His smokeless tobacco use includes Chew. He reports that he does not drink alcohol or use illicit drugs.  REVIEW OF SYSTEMS:   Unable to obtain.  SUBJECTIVE:   VITAL SIGNS: BP 126/84 mmHg  Pulse 106  Temp(Src) 98.5 F (36.9 C) (Oral)  Resp 18  SpO2 100%  HEMODYNAMICS:    VENTILATOR SETTINGS: Vent Mode:  [-] PRVC FiO2 (%):  [40 %] 40 % Set Rate:  [15 bmp-16 bmp] 16 bmp Vt Set:  [550 mL] 550 mL PEEP:  [5 cmH20-8 cmH20] 5 cmH20 Plateau Pressure:  [16 cmH20] 16 cmH20  INTAKE / OUTPUT:    PHYSICAL EXAMINATION: General:  Ill appearing Neuro:  Sedated HEENT:  Pupils pinpoint, ETT in place Cardiovascular:  irregular Lungs:  Prolonged exhalation, faint b/l wheeze with scattered crackles Abdomen:  Soft, non tender Musculoskeletal:  1+ edema Skin:  No rashes  LABS:  BMET  Recent Labs Lab 05/10/16 2255  NA 138  K 4.4  CL 97*  CO2 35*  BUN 10  CREATININE 0.89  GLUCOSE 168*    Electrolytes  Recent Labs Lab 05/10/16 2255  CALCIUM 9.1    CBC  Recent Labs Lab 05/10/16 2255  WBC 7.1  HGB 12.7*  HCT 42.1  PLT 238    Coag's No results for input(s): APTT, INR in the last 168 hours.  Sepsis Markers No results for input(s): LATICACIDVEN, PROCALCITON, O2SATVEN in the last 168 hours.  ABG  Recent Labs Lab 05/10/16 2254 05/10/16 2358  PHART 7.235* 7.176*  PCO2ART 83.5* 97.7*  PO2ART 54.0* 164.0*    Liver Enzymes  Recent Labs Lab 05/10/16 2255  AST 19  ALT 20  ALKPHOS 58  BILITOT 0.4  ALBUMIN 3.8    Cardiac Enzymes No results for input(s): TROPONINI, PROBNP in the last 168 hours.  Glucose No results for input(s): GLUCAP in the last 168 hours.  Imaging Dg Chest Port 1 View  05/11/2016  CLINICAL DATA:  Intubation. EXAM: PORTABLE CHEST 1 VIEW COMPARISON:  Yesterday at 2257 hour FINDINGS: Endotracheal tube 7.5 cm from the carina. Enteric tube  is in place, tip below the diaphragm, side-port not well visualized. Improved lung aeration from pre intubation exam. Persistent right basilar hazy opacity likely pleural effusion. The right upper lobe opacity has diminished in the interim. Left basilar subsegmental atelectasis. Cardiomediastinal contours are unchanged. IMPRESSION: 1. Endotracheal tube 7.5 cm from the carina. Enteric tube in place, tip below the diaphragm. 2. Improved lung aeration from pre intubation exam. Right pleural effusion again seen. Right suprahilar opacity with question mild decrease in the interim versus differences in technique. Electronically Signed   By: Jeb Levering M.D.   On: 05/11/2016 00:36   Dg Chest Port 1 View  05/10/2016  CLINICAL DATA:  Shortness of breath. Patient with history of lung cancer in radiation. EXAM: PORTABLE CHEST 1 VIEW COMPARISON:  Radiographs 04/13/2016.  CT 01/23/2016 FINDINGS: Lungs remain hyperinflated with emphysema. Right suprahilar opacity has progressed from prior radiograph. Hazy opacity in the right lower  lung zone consistent with pleural effusion, likely small to moderate in size. No focal abnormality in the left lung. Heart size and mediastinal contours are unchanged. IMPRESSION: 1. Hazy opacity at the right lung base suspicious for pleural effusion, likely small to moderate in size. 2. Right suprahilar opacity, patient with history of radiation fibrosis and adjacent pulmonary nodule on CT. This appears increased in degree compared to prior radiograph, however uncertain to what extent this is secondary to differences in technique. Electronically Signed   By: Jeb Levering M.D.   On: 05/10/2016 23:25     STUDIES:  03/27/12 PFT >> FEV1 1.08 (40%), FEV1% 40, TLC 7.16 (119%), DLCO 67%, +BD  CULTURES: 5/30 Blood >> 5/30 Sputum >>  ANTIBIOTICS: 5/30 Fortaz >> 5/30 Vancomycin >>   SIGNIFICANT EVENTS: 5/29 Admit  LINES/TUBES: 5/30 ETT >>   DISCUSSION: 75 yo male with AECOPD,  VDRF, HCAP.  Hx of NSCLC s/p XRT, systolic CHF, PAF.  ASSESSMENT / PLAN:  PULMONARY A: Acute on chronic hypoxic/hypercapnic respiratory failure 2nd to HCAP, AECOPD. P:   Full vent support  Monitor for PEEPi >> allow for permissive hypercapnia Scheduled BDs Solumedrol F/u CXR, ABG  CARDIOVASCULAR A:  Hx of PAF, HTN, HLD, chronic systolic CHF. P:  Coumadin per pharmacy Continue cardizem, digoxin, zocor Monitor hemodynamics  RENAL A:   Hx of Lt nephrectomy. P:   Monitor renal fx, urine outpt, electrolytes  GASTROINTESTINAL A:   Nutrition. P:   Tube feeds Protonix for SUP  HEMATOLOGIC A:   Hx of NSCLC (adenocarcinoma) s/p XRT. P:  F/u CBC  INFECTIOUS A:   HCAP >> has progression of RUL ASD. P:   Continue Abx Check procalcitonin, lactic acid  ENDOCRINE A:   Hx of DM with steroid induced hyperglycemia.  P:   SSI Hold amaryl, glucophage  NEUROLOGIC A:   Acute metabolic encephalopathy. P:   RASS goal: -1 Hold outpt remeron, oxycodone  No family at bedside.  CC time 42 minutes.  Chesley Mires, MD [facility] Pulmonary/Critical Care 05/11/2016, 12:59 AM Pager:  [phone] After 3pm call: [phone]

## 2016-05-12 ENCOUNTER — Inpatient Hospital Stay (HOSPITAL_COMMUNITY): Payer: Medicare Other

## 2016-05-12 DIAGNOSIS — J9600 Acute respiratory failure, unspecified whether with hypoxia or hypercapnia: Secondary | ICD-10-CM

## 2016-05-12 DIAGNOSIS — J9621 Acute and chronic respiratory failure with hypoxia: Secondary | ICD-10-CM

## 2016-05-12 DIAGNOSIS — E872 Acidosis: Secondary | ICD-10-CM

## 2016-05-12 DIAGNOSIS — J189 Pneumonia, unspecified organism: Secondary | ICD-10-CM

## 2016-05-12 DIAGNOSIS — R410 Disorientation, unspecified: Secondary | ICD-10-CM

## 2016-05-12 DIAGNOSIS — J9622 Acute and chronic respiratory failure with hypercapnia: Secondary | ICD-10-CM

## 2016-05-12 LAB — BASIC METABOLIC PANEL
Anion gap: 7 (ref 5–15)
Anion gap: 9 (ref 5–15)
BUN: 18 mg/dL (ref 6–20)
BUN: 18 mg/dL (ref 6–20)
CALCIUM: 8.4 mg/dL — AB (ref 8.9–10.3)
CHLORIDE: 99 mmol/L — AB (ref 101–111)
CHLORIDE: 98 mmol/L — AB (ref 101–111)
CO2: 32 mmol/L (ref 22–32)
CO2: 32 mmol/L (ref 22–32)
CREATININE: 0.89 mg/dL (ref 0.61–1.24)
CREATININE: 0.91 mg/dL (ref 0.61–1.24)
Calcium: 8.6 mg/dL — ABNORMAL LOW (ref 8.9–10.3)
GFR calc non Af Amer: >60 mL/min (ref >60)
GFR calc non Af Amer: >60 mL/min (ref >60)
Glucose, Bld: 239 mg/dL — ABNORMAL HIGH (ref 65–99)
Glucose, Bld: 373 mg/dL — ABNORMAL HIGH (ref 65–99)
POTASSIUM: 3.8 mmol/L (ref 3.5–5.1)
Potassium: 3.7 mmol/L (ref 3.5–5.1)
SODIUM: 139 mmol/L (ref 135–145)
Sodium: 138 mmol/L (ref 135–145)

## 2016-05-12 LAB — GLUCOSE, CAPILLARY
Comment 2: 4
GLUCOSE-CAPILLARY: 221 mg/dL — AB (ref 65–99)
GLUCOSE-CAPILLARY: 248 mg/dL — AB (ref 65–99)
GLUCOSE-CAPILLARY: 120 mg/dL — AB (ref 65–99)
GLUCOSE-CAPILLARY: 62 mg/dL — AB (ref 65–99)
GLUCOSE-CAPILLARY: 373 mg/dL — AB (ref 65–99)
GLUCOSE-CAPILLARY: 177 mg/dL — AB (ref 65–99)
Glucose-Capillary: 224 mg/dL — ABNORMAL HIGH (ref 65–99)
Glucose-Capillary: 355 mg/dL — ABNORMAL HIGH (ref 65–99)

## 2016-05-12 LAB — PROTIME-INR
INR: 2.03 — ABNORMAL HIGH (ref 0.00–1.49)
Prothrombin Time: 22.9 seconds — ABNORMAL HIGH (ref 11.6–15.2)

## 2016-05-12 LAB — PHOSPHORUS: PHOSPHORUS: 2.7 mg/dL (ref 2.5–4.6)

## 2016-05-12 LAB — MAGNESIUM: Magnesium: 2 mg/dL (ref 1.7–2.4)

## 2016-05-12 LAB — PROCALCITONIN: Procalcitonin: <0.1 ng/mL

## 2016-05-12 MED ORDER — ETOMIDATE 2 MG/ML IV SOLN
INTRAVENOUS | Status: AC
  Filled 2016-05-12: qty 10

## 2016-05-12 MED ORDER — INSULIN ASPART 100 UNIT/ML ~~LOC~~ SOLN
0.0000 [IU] | Freq: Every day | SUBCUTANEOUS | Status: DC
  Administered 2016-05-13: 2 [IU] via SUBCUTANEOUS
  Administered 2016-05-14: 3 [IU] via SUBCUTANEOUS
  Administered 2016-05-15 – 2016-05-17 (×2): 2 [IU] via SUBCUTANEOUS

## 2016-05-12 MED ORDER — IPRATROPIUM-ALBUTEROL 0.5-2.5 (3) MG/3ML IN SOLN
3.0000 mL | RESPIRATORY_TRACT | Status: DC
  Administered 2016-05-12 – 2016-05-17 (×30): 3 mL via RESPIRATORY_TRACT
  Filled 2016-05-12 (×30): qty 3

## 2016-05-12 MED ORDER — DEXTROSE 50 % IV SOLN
25.0000 mL | Freq: Once | INTRAVENOUS | Status: AC
  Filled 2016-05-12: qty 50

## 2016-05-12 MED ORDER — ENOXAPARIN SODIUM 80 MG/0.8ML ~~LOC~~ SOLN
75.0000 mg | Freq: Two times a day (BID) | SUBCUTANEOUS | Status: DC
  Administered 2016-05-12 – 2016-05-16 (×10): 75 mg via SUBCUTANEOUS
  Filled 2016-05-12 (×2): qty 0.8
  Filled 2016-05-12 (×2): qty 0.75
  Filled 2016-05-12: qty 0.8
  Filled 2016-05-12: qty 0.75
  Filled 2016-05-12 (×4): qty 0.8
  Filled 2016-05-12 (×2): qty 0.75

## 2016-05-12 MED ORDER — FENTANYL CITRATE (PF) 100 MCG/2ML IJ SOLN
INTRAMUSCULAR | Status: AC
  Filled 2016-05-12: qty 2

## 2016-05-12 MED ORDER — INSULIN ASPART 100 UNIT/ML ~~LOC~~ SOLN
0.0000 [IU] | Freq: Three times a day (TID) | SUBCUTANEOUS | Status: DC
  Administered 2016-05-12: 9 [IU] via SUBCUTANEOUS
  Administered 2016-05-13 (×2): 7 [IU] via SUBCUTANEOUS
  Administered 2016-05-13 – 2016-05-15 (×5): 5 [IU] via SUBCUTANEOUS
  Administered 2016-05-15 – 2016-05-16 (×3): 2 [IU] via SUBCUTANEOUS
  Administered 2016-05-16: 1 [IU] via SUBCUTANEOUS

## 2016-05-12 MED ORDER — METHYLPREDNISOLONE SODIUM SUCC 40 MG IJ SOLR
40.0000 mg | Freq: Three times a day (TID) | INTRAMUSCULAR | Status: DC
Start: 2016-05-12 — End: 2016-05-13
  Administered 2016-05-12 – 2016-05-13 (×3): 40 mg via INTRAVENOUS
  Filled 2016-05-12 (×4): qty 1

## 2016-05-12 MED ORDER — MIDAZOLAM HCL 2 MG/2ML IJ SOLN
INTRAMUSCULAR | Status: DC
Start: 2016-05-12 — End: 2016-05-12
  Filled 2016-05-12: qty 4

## 2016-05-12 MED ORDER — FUROSEMIDE 10 MG/ML IJ SOLN
20.0000 mg | Freq: Once | INTRAMUSCULAR | Status: AC
  Administered 2016-05-13: 20 mg via INTRAVENOUS
  Filled 2016-05-12: qty 2

## 2016-05-12 MED ORDER — FUROSEMIDE 10 MG/ML IJ SOLN
40.0000 mg | Freq: Once | INTRAMUSCULAR | Status: AC
  Administered 2016-05-12: 40 mg via INTRAVENOUS
  Filled 2016-05-12: qty 4

## 2016-05-12 MED ORDER — DEXTROSE 50 % IV SOLN
INTRAVENOUS | Status: AC
  Filled 2016-05-12: qty 50

## 2016-05-12 MED ORDER — MORPHINE SULFATE (PF) 2 MG/ML IV SOLN
2.0000 mg | Freq: Once | INTRAVENOUS | Status: AC
  Administered 2016-05-13: 2 mg via INTRAVENOUS

## 2016-05-12 NOTE — Progress Notes (Signed)
PULMONARY / CRITICAL CARE MEDICINE   Name: Brent Wade MRN: [id number] DOB: 11-Jan-1941    ADMISSION DATE:  05/10/2016  REFERRING MD:  Dr. Rogene Houston  CHIEF COMPLAINT:  Short of breath  HISTORY OF PRESENT ILLNESS:   Hx from chart.  75 yo male brought to ER with progressive dyspnea.  He was also hypoxic.  He took pain medication earlier in the day.  He was found to be lethargic and had respiratory acidosis on ABG.  He was tried on BiPAP but continued to get worse.  He required intubation.  He has hx of GOLD D COPD, NSCLC s/p XRT, systolic CHF, PAF on coumadin.  He was in hospital recently for AECOPD.   SUBJECTIVE:  No distress , weaning well  VITAL SIGNS: BP 118/69 mmHg  Pulse 93  Temp(Src) 98.3 F (36.8 C) (Oral)  Resp 23  Ht '5\' 10"'$  (1.778 m)  Wt 164 lb 0.4 oz (74.4 kg)  BMI 23.53 kg/m2  SpO2 100%  HEMODYNAMICS:    VENTILATOR SETTINGS: Vent Mode:  [-] PSV;CPAP FiO2 (%):  [40 %] 40 % Set Rate:  [18 bmp] 18 bmp Vt Set:  [550 mL] 550 mL PEEP:  [5 cmH20] 5 cmH20 Pressure Support:  [5 cmH20-10 cmH20] 5 cmH20 Plateau Pressure:  [14 cmH20-20 cmH20] 17 cmH20  INTAKE / OUTPUT: I/O last 3 completed shifts: In: 2654 [I.V.:814; NG/GT:840; IV Piggyback:1000] Out: 1350 [Urine:1350]  PHYSICAL EXAMINATION: General: no distress on PSV, wants tube out Neuro: awake, f/c; appropriate  HEENT:  Pupils pinpoint, ETT in place Cardiovascular:  Irregular no MRG Lungs:  Prolonged exhalation, wheeze resolved Abdomen:  Soft, non tender Musculoskeletal:  3+ edema Skin:  No rashes  LABS:  BMET  Recent Labs Lab 05/11/16 0133 05/11/16 0422 05/12/16 0458  NA 138 140 138  K 4.8 4.5 3.8  CL 97* 102 99*  CO2 34* 30 32  BUN '12 12 18  '$ CREATININE 1.00 0.89 0.89  GLUCOSE 209* 201* 239*    Electrolytes  Recent Labs Lab 05/11/16 0133 05/11/16 0422 05/11/16 1753 05/12/16 0458  CALCIUM 8.7* 8.4*  --  8.6*  MG 1.9 1.8 2.0 2.0  PHOS 4.0 3.8 2.6 2.7    CBC  Recent  Labs Lab 05/10/16 2255 05/11/16 0422  WBC 7.1 6.3  HGB 12.7* 11.2*  HCT 42.1 37.0*  PLT 238 186    Coag's  Recent Labs Lab 05/11/16 0133 05/12/16 0458  INR 2.54* 2.03*    Sepsis Markers  Recent Labs Lab 05/11/16 0133 05/11/16 0427 05/12/16 0458  LATICACIDVEN 2.0 2.1*  --   PROCALCITON <0.10  --  <0.10    ABG  Recent Labs Lab 05/10/16 2358 05/11/16 0152 05/11/16 0448  PHART 7.176* 7.253* 7.345*  PCO2ART 97.7* 75.8* 57.6*  PO2ART 164.0* 107.0* 116.0*    Liver Enzymes  Recent Labs Lab 05/10/16 2255 05/11/16 0133  AST 19 23  ALT 20 20  ALKPHOS 58 55  BILITOT 0.4 0.6  ALBUMIN 3.8 3.5    Cardiac Enzymes No results for input(s): TROPONINI, PROBNP in the last 168 hours.  Glucose  Recent Labs Lab 05/11/16 0813 05/11/16 1139 05/11/16 1548 05/11/16 1949 05/12/16 0005 05/12/16 0406  GLUCAP 210* 129* 201* 199* 224* 221*    Imaging Dg Chest Port 1 View  05/12/2016  CLINICAL DATA:  Respiratory failure. EXAM: PORTABLE CHEST 1 VIEW COMPARISON:  05/11/2016. 04/13/2016. 03/29/2016. 04/25/2015 . CT 01/23/2016 . FINDINGS: Endotracheal tube, NG tube in good anatomic position. Endotracheal tube tip 3 cm above  the carina. Cardiomegaly. Increased interstitial markings in both lung bases with bilateral pleural effusions. These findings suggest mild congestive heart failure. Bibasilar pneumonia cannot be excluded. Right upper lobe pleural parenchymal thickening again noted consistent with scarring/radiation change and pulmonary nodule as noted on prior studies. No significant interim change. IMPRESSION: 1. Lines and tubes in good anatomic position. 2. Cardiomegaly with increased interstitial markings in the lung bases and small bilateral pleural effusions. These findings suggest congestive heart failure. Mild bibasilar pneumonia cannot be excluded. 3. Persistent right upper lobe pleural parenchymal thickening consistent with previously identified scarring/radiation  change and pulmonary nodule. No significant interim change. Electronically Signed   By: Marcello Moores  Register   On: 05/12/2016 07:48  PCXR decreased aeration in bases which appears to be mix of effusion and atx   STUDIES:  03/27/12 PFT >> FEV1 1.08 (40%), FEV1% 40, TLC 7.16 (119%), DLCO 67%, +BD  CULTURES: 5/30 Blood >> 5/30 Sputum: rarer GNR and rare GPR  ANTIBIOTICS: 5/30 Fortaz >> 5/30 Vancomycin >> 5/31  SIGNIFICANT EVENTS: 5/29 Admit  LINES/TUBES: 5/30 ETT >> 5/31  DISCUSSION: 75 yo male with AECOPD, VDRF, HCAP.  Hx of NSCLC s/p XRT, systolic CHF, PAF. Bronchospasm resolved. Now awake and ready for extubation. Will diurese him today; cont abx but dc vanc, mobilize and cont supportive care.     ASSESSMENT / PLAN:  PULMONARY A: Acute on chronic hypoxic/hypercapnic respiratory failure 2nd to HCAP, AECOPD. -->passing SBT; Has some volume loss on CXR which looks to be element of effusion and atx. He feels ready to come off vent and mechanics suggest he can. P:   SBT  cpap 5 ps 5, assess mechanics extubate Scheduled BDs Add flutter and IS Wean O2 Reduce Solumedrol  CXR PRN   CARDIOVASCULAR A:  Hx of PAF, HTN, HLD, chronic systolic CHF. P:  LMWH; eventually back to coumadin  Continue cardizem, digoxin, zocor Monitor hemodynamics  RENAL A:   Hx of Lt nephrectomy. P:   Monitor renal fx, urine outpt, electrolytes  GASTROINTESTINAL A:   Nutrition. P:   Tube feeds Protonix for SUP  HEMATOLOGIC A:   Hx of NSCLC (adenocarcinoma) s/p XRT. Coumadin at home ( fib) P:  F/u CBC Cont LMWH for now  INFECTIOUS A:   R/o HCAP >> has progression of RUL ASD. P:   Continue Abx  ENDOCRINE A:   Hx of DM with steroid induced hyperglycemia.  P:   SSI Hold amaryl, glucophage  NEUROLOGIC A:   Acute metabolic encephalopathy. Physical deconditioning  P:   Dc sedation  Hold outpt remeron, oxycodone PT consult     Erick Colace ACNP-BC [place] Pager # [phone] OR # [phone] if no answer   05/12/2016 8:49 AM  STAFF NOTE: I, Merrie Roof, MD FACP have personally reviewed patient's available data, including medical history, events of note, physical examination and test results as part of my evaluation. I have discussed with resident/NP and other care providers such as pharmacist, RN and RRT. In addition, I personally evaluated patient and elicited key findings of: awake, alert, no sig bronchospasm noted, pcxr under penetrated overall unchanged, weaning aggressive ps 5, cpap 5, goal 30 min assessment rsbi, pcx rin am , will need follo wup oncology, steroi redcution, ceftaz for now, may narrow in am or dc all together, follow clinical course, pct  The patient is critically ill with multiple organ systems failure and requires high complexity decision making for assessment and support, frequent evaluation and titration of  therapies, application of advanced monitoring technologies and extensive interpretation of multiple databases.   Critical Care Time devoted to patient care services described in this note is 30 Minutes. This time reflects time of care of this signee: Merrie Roof, MD FACP. This critical care time does not reflect procedure time, or teaching time or supervisory time of PA/NP/Med student/Med Resident etc but could involve care discussion time. Rest per NP/medical resident whose note is outlined above and that I agree with   Lavon Paganini. Titus Mould, MD, [facility] Pgr: [facility] 05/12/2016 9:07 AM

## 2016-05-12 NOTE — Progress Notes (Signed)
Pt tolerating NIV well at this time no complications noted.

## 2016-05-12 NOTE — Progress Notes (Signed)
PCCM Attending Note: Patient seen emergently at bedside at the request respiratory therapist. Utilizing accessory muscles with mild retractions and increased work of breathing. Patient with limited air movement. Speaking in complete sentences. Reports breathing continues to be worse than his baseline. Receiving nebulizer treatment. Initiating BiPAP therapy with low threshold for endotracheal intubation.  Sonia Baller Ashok Cordia, M.D. [facility] Pulmonary & Critical Care Pager:  [phone] After 3pm or if no response, call [phone] 4:45 PM 05/12/2016

## 2016-05-12 NOTE — Progress Notes (Signed)
RT note- Moderate respiratory distress, Dr. Ashok Cordia at bedside, Bipap ordered at this time, and patient placed on current setting. Continue to monitor.Marland Kitchen

## 2016-05-12 NOTE — Care Management Note (Signed)
Case Management Note  Patient Details  Name: VAN SEYMORE MRN: [id number] Date of Birth: [birth year]  Subjective/Objective:    Pt admitted with shortness of breath  Plan:  Pt is from home alone PTA independent.  CM will continue to follow for discharge needs  Expected Discharge Date:                  Expected Discharge Plan:  Home/Self Care  In-House Referral:     Discharge planning Services  CM Consult  Post Acute Care Choice:    Choice offered to:     DME Arranged:    DME Agency:     HH Arranged:    HH Agency:     Status of Service:  In process, will continue to follow  Medicare Important Message Given:    Date Medicare IM Given:    Medicare IM give by:    Date Additional Medicare IM Given:    Additional Medicare Important Message give by:     If discussed at [place] of Stay Meetings, dates discussed:    Additional Comments:  Maryclare Labrador, RN 05/12/2016, 3:08 PM

## 2016-05-12 NOTE — Progress Notes (Signed)
Pt titrated to [place], pt is tolerating it well no distress noted. Family at bedside.

## 2016-05-12 NOTE — Progress Notes (Signed)
Pt placed back on BiPAP per desatting and no O2 reserve. Pt is stable at this time tolerating it well.

## 2016-05-12 NOTE — Procedures (Signed)
Extubation Procedure Note  Patient Details:   Name: Brent Wade DOB: [birth year] MRN: [id number]   Airway Documentation:     Evaluation  O2 sats: stable throughout Complications: No apparent complications Patient did tolerate procedure well. Bilateral Breath Sounds: Clear, Diminished   Yes  Placed on 4l/min [place]  Revonda Standard 05/12/2016, 9:10 AM

## 2016-05-12 NOTE — Progress Notes (Signed)
ANTICOAGULATION CONSULT NOTE - Follow Up Consult  Pharmacy Consult for Lovenox Indication: atrial fibrillation  No Known Allergies  Patient Measurements: Height: '5\' 10"'$  (177.8 cm) Weight: 164 lb 0.4 oz (74.4 kg) IBW/kg (Calculated) : 73  Vital Signs: Temp: 98.2 F (36.8 C) (05/31 1233) Temp Source: Oral (05/31 1233) BP: 145/116 mmHg (05/31 1200) Pulse Rate: 93 (05/31 1200)  Labs:  Recent Labs  05/10/16 2255 05/11/16 0133 05/11/16 0422 05/12/16 0458  HGB 12.7*  --  11.2*  --   HCT 42.1  --  37.0*  --   PLT 238  --  186  --   LABPROT  --  27.0*  --  22.9*  INR  --  2.54*  --  2.03*  CREATININE 0.89 1.00 0.89 0.89    Estimated Creatinine Clearance: 72.9 mL/min (by C-G formula based on Cr of 0.89).   Medications:  Prior to Admit - Coumadin 7.'5mg'$  on Sundays, '5mg'$  all other days  Assessment: 75 year old male admitted with COPD exacerbation and now requiring mechanical ventilation.  He is on chronic anticoagulation with Coumadin for atrial fibrillation and his INR is trending down with last Coumadin dose was 5/28.   Per Dr. Titus Mould, ok to initiate Lovenox with INR of 2.03 today.  SCr stable. No bleeding reported.   Goal of Therapy:  Anti-Xa level 0.6-1 units/ml 4hrs after LMWH dose given Monitor platelets by anticoagulation protocol: Yes   Plan:  Start Lovenox 1 mg/kg (75 mg)  SQ q12h  CBC at least q72h Monitor for signs and symptoms of bleeding  Sloan Leiter, PharmD, BCPS Clinical Pharmacist [phone]  05/12/2016, 1:35 PM

## 2016-05-13 ENCOUNTER — Inpatient Hospital Stay (HOSPITAL_COMMUNITY): Payer: Medicare Other

## 2016-05-13 ENCOUNTER — Ambulatory Visit: Payer: Self-pay | Admitting: Radiation Oncology

## 2016-05-13 LAB — CBC
HCT: 36.4 % — ABNORMAL LOW (ref 39.0–52.0)
Hemoglobin: 11.1 g/dL — ABNORMAL LOW (ref 13.0–17.0)
MCH: 29.2 pg (ref 26.0–34.0)
MCHC: 30.5 g/dL (ref 30.0–36.0)
MCV: 95.8 fL (ref 78.0–100.0)
PLATELETS: 191 10*3/uL (ref 150–400)
RBC: 3.8 MIL/uL — ABNORMAL LOW (ref 4.22–5.81)
RDW: 14.9 % (ref 11.5–15.5)
WBC: 8.7 10*3/uL (ref 4.0–10.5)

## 2016-05-13 LAB — BASIC METABOLIC PANEL
ANION GAP: 6 (ref 5–15)
BUN: 19 mg/dL (ref 6–20)
CALCIUM: 8.8 mg/dL — AB (ref 8.9–10.3)
CO2: 36 mmol/L — ABNORMAL HIGH (ref 22–32)
Chloride: 99 mmol/L — ABNORMAL LOW (ref 101–111)
Creatinine, Ser: 1.03 mg/dL (ref 0.61–1.24)
GFR calc Af Amer: >60 mL/min (ref >60)
GLUCOSE: 267 mg/dL — AB (ref 65–99)
Potassium: 4 mmol/L (ref 3.5–5.1)
Sodium: 141 mmol/L (ref 135–145)

## 2016-05-13 LAB — GLUCOSE, CAPILLARY
GLUCOSE-CAPILLARY: 326 mg/dL — AB (ref 65–99)
GLUCOSE-CAPILLARY: 310 mg/dL — AB (ref 65–99)
GLUCOSE-CAPILLARY: 239 mg/dL — AB (ref 65–99)
Glucose-Capillary: 259 mg/dL — ABNORMAL HIGH (ref 65–99)

## 2016-05-13 LAB — PROTIME-INR
INR: 1.64 — AB (ref 0.00–1.49)
PROTHROMBIN TIME: 19.4 s — AB (ref 11.6–15.2)

## 2016-05-13 LAB — PROCALCITONIN

## 2016-05-13 MED ORDER — METHYLPREDNISOLONE SODIUM SUCC 125 MG IJ SOLR
80.0000 mg | Freq: Three times a day (TID) | INTRAMUSCULAR | Status: DC
  Administered 2016-05-13 – 2016-05-14 (×3): 80 mg via INTRAVENOUS
  Filled 2016-05-13: qty 2
  Filled 2016-05-13 (×3): qty 1.28

## 2016-05-13 MED ORDER — PANTOPRAZOLE SODIUM 40 MG PO TBEC
40.0000 mg | DELAYED_RELEASE_TABLET | Freq: Every day | ORAL | Status: DC
  Administered 2016-05-13 – 2016-05-17 (×5): 40 mg via ORAL
  Filled 2016-05-13 (×5): qty 1

## 2016-05-13 MED ORDER — DILTIAZEM 12 MG/ML ORAL SUSPENSION
60.0000 mg | Freq: Two times a day (BID) | ORAL | Status: DC
  Administered 2016-05-13 – 2016-05-17 (×9): 60 mg via ORAL
  Filled 2016-05-13 (×9): qty 6

## 2016-05-13 MED ORDER — MORPHINE SULFATE (PF) 2 MG/ML IV SOLN
INTRAVENOUS | Status: AC
  Administered 2016-05-13: 2 mg via INTRAVENOUS
  Filled 2016-05-13: qty 1

## 2016-05-13 MED ORDER — FUROSEMIDE 10 MG/ML IJ SOLN
40.0000 mg | Freq: Two times a day (BID) | INTRAMUSCULAR | Status: AC
  Administered 2016-05-13 (×2): 40 mg via INTRAVENOUS
  Filled 2016-05-13 (×2): qty 4

## 2016-05-13 MED ORDER — INSULIN GLARGINE 100 UNIT/ML ~~LOC~~ SOLN
10.0000 [IU] | Freq: Every day | SUBCUTANEOUS | Status: DC
  Administered 2016-05-13: 10 [IU] via SUBCUTANEOUS
  Filled 2016-05-13 (×2): qty 0.1

## 2016-05-13 MED ORDER — POTASSIUM CHLORIDE 20 MEQ/15ML (10%) PO SOLN
20.0000 meq | Freq: Two times a day (BID) | ORAL | Status: AC
  Administered 2016-05-13 (×2): 20 meq via ORAL
  Filled 2016-05-13 (×2): qty 15

## 2016-05-13 MED ORDER — MORPHINE SULFATE (PF) 2 MG/ML IV SOLN
1.0000 mg | INTRAVENOUS | Status: DC | PRN
  Administered 2016-05-13 – 2016-05-17 (×8): 1 mg via INTRAVENOUS
  Filled 2016-05-13 (×8): qty 1

## 2016-05-13 MED ORDER — DIGOXIN 125 MCG PO TABS
0.1250 mg | ORAL_TABLET | Freq: Every day | ORAL | Status: DC
  Administered 2016-05-13 – 2016-05-17 (×5): 0.125 mg via ORAL
  Filled 2016-05-13 (×5): qty 1

## 2016-05-13 NOTE — Progress Notes (Signed)
PT Cancellation Note  Patient Details Name: Brent Wade MRN: [id number] DOB: [birth year]   Cancelled Treatment:    Reason Eval/Treat Not Completed: Medical issues which prohibited therapy. Pt on/off Bipap with tenuous respiratory status. Will try again tomorrow.   Samadhi Mahurin 05/13/2016, 2:24 PM [facility] PT [phone]

## 2016-05-13 NOTE — Progress Notes (Signed)
[facility] Progress Note Patient Name: REGINOLD BEALE DOB: 20-Sep-1941 MRN: [id number]   Date of Service  05/13/2016  HPI/Events of Note  Moderate resp distress- anxious, inc rr, and inc HR  eICU Interventions  Morphine '2mg'$  x 1, lasix '20mg'$  IV x 1     Intervention Category Major Interventions: Respiratory failure - evaluation and management  Rama Mcclintock 05/13/2016, 12:01 AM

## 2016-05-13 NOTE — Progress Notes (Signed)
Inpatient Diabetes Program Recommendations  AACE/ADA: New Consensus Statement on Inpatient Glycemic Control (2015)  Target Ranges:  Prepandial:   less than 140 mg/dL      Peak postprandial:   less than 180 mg/dL (1-2 hours)      Critically ill patients:  140 - 180 mg/dL   Results for KIMARI, COUDRIET (MRN [id number]) as of 05/13/2016 09:44  Ref. Range 05/12/2016 12:23 05/12/2016 15:52 05/12/2016 17:34 05/12/2016 21:50 05/13/2016 07:56  Glucose-Capillary Latest Ref Range: 65-99 mg/dL 120 (H) 373 (H) 355 (H) 177 (H) 259 (H)   Diabetes history: Type 2 diabetes Outpatient Diabetes medications: Amaryl 2 mg daily, Metformin 1000 mg bid Current orders for Inpatient glycemic control:  Novolog sensitive tid with meals and HS, Solumedrol 40 mg IV q 8 hours.  Inpatient Diabetes Program Recommendations:    May consider adding Levemir 10 units daily while on steroids.  Also may consider increasing Novolog correction to moderate tid with meals.  Thanks, Adah Perl, RN, BC-ADM Inpatient Diabetes Coordinator Pager [phone] (8a-5p)

## 2016-05-13 NOTE — Progress Notes (Signed)
Event: Respiratory Distress/ Pt c/o Cant breathe  IPAP increased per Increase WOB and SOB RN/MD aware. Pt appears more comfortable at this time.

## 2016-05-13 NOTE — Progress Notes (Signed)
Pt c/o having difficulty breathing and shortness of breath. Elink MD paged and '2mg'$  IV morphine and '20mg'$  IV Lasix was ordered and given. Pt appeared to be more comfortable.  Will continue to monitor closely.

## 2016-05-13 NOTE — Progress Notes (Addendum)
PULMONARY / CRITICAL CARE MEDICINE   Name: Brent Wade MRN: [id number] DOB: 03/22/1941    ADMISSION DATE:  05/10/2016  REFERRING MD:  Dr. Rogene Houston  CHIEF COMPLAINT:  Short of breath  HISTORY OF PRESENT ILLNESS:   Hx from chart.  75 yo male brought to ER with progressive dyspnea.  He was also hypoxic.  He took pain medication earlier in the day.  He was found to be lethargic and had respiratory acidosis on ABG.  He was tried on BiPAP but continued to get worse.  He required intubation.  He has hx of GOLD D COPD, NSCLC s/p XRT, systolic CHF, PAF on coumadin.  He was in hospital recently for AECOPD.   SUBJECTIVE:  Reports shortness of breath   VITAL SIGNS: BP 131/97 mmHg  Pulse 61  Temp(Src) 99.1 F (37.3 C) (Oral)  Resp 40  Ht '5\' 10"'$  (1.778 m)  Wt 158 lb 4.6 oz (71.8 kg)  BMI 22.71 kg/m2  SpO2 96%  HEMODYNAMICS:    VENTILATOR SETTINGS: Vent Mode:  [-] BIPAP;PCV FiO2 (%):  [40 %] 40 % Set Rate:  [10 bmp-14 bmp] 14 bmp PEEP:  [5 cmH20] 5 cmH20 Pressure Support:  [5 cmH20] 5 cmH20  INTAKE / OUTPUT:  Intake/Output Summary (Last 24 hours) at 05/13/16 0845 Last data filed at 05/13/16 0800  Gross per 24 hour  Intake    410 ml  Output   3385 ml  Net  -2975 ml     PHYSICAL EXAMINATION: General: frail; 75 year old male, still w/ labored breathing Neuro: awake, f/c; appropriate and oriented x 3 HEENT:  NCAT, MMM, no JVD Cardiovascular:  Irregular no MRG Lungs:  Prolonged exhalation, occasional upper airway wheeze.   Abdomen:  Soft, non tender Musculoskeletal:  3+ edema-->improved Skin:  No rashes  LABS:  BMET  Recent Labs Lab 05/12/16 0458 05/12/16 1618 05/13/16 0330  NA 138 139 141  K 3.8 3.7 4.0  CL 99* 98* 99*  CO2 32 32 36*  BUN '18 18 19  '$ CREATININE 0.89 0.91 1.03  GLUCOSE 239* 373* 267*    Electrolytes  Recent Labs Lab 05/11/16 0422 05/11/16 1753 05/12/16 0458 05/12/16 1618 05/13/16 0330  CALCIUM 8.4*  --  8.6* 8.4* 8.8*  MG  1.8 2.0 2.0  --   --   PHOS 3.8 2.6 2.7  --   --     CBC  Recent Labs Lab 05/10/16 2255 05/11/16 0422 05/13/16 0330  WBC 7.1 6.3 8.7  HGB 12.7* 11.2* 11.1*  HCT 42.1 37.0* 36.4*  PLT 238 186 191    Coag's  Recent Labs Lab 05/11/16 0133 05/12/16 0458 05/13/16 0330  INR 2.54* 2.03* 1.64*    Sepsis Markers  Recent Labs Lab 05/11/16 0133 05/11/16 0427 05/12/16 0458 05/13/16 0330  LATICACIDVEN 2.0 2.1*  --   --   PROCALCITON <0.10  --  <0.10 <0.10    ABG  Recent Labs Lab 05/10/16 2358 05/11/16 0152 05/11/16 0448  PHART 7.176* 7.253* 7.345*  PCO2ART 97.7* 75.8* 57.6*  PO2ART 164.0* 107.0* 116.0*    Liver Enzymes  Recent Labs Lab 05/10/16 2255 05/11/16 0133  AST 19 23  ALT 20 20  ALKPHOS 58 55  BILITOT 0.4 0.6  ALBUMIN 3.8 3.5    Cardiac Enzymes No results for input(s): TROPONINI, PROBNP in the last 168 hours.  Glucose  Recent Labs Lab 05/12/16 0823 05/12/16 1156 05/12/16 1223 05/12/16 1552 05/12/16 1734 05/12/16 2150  GLUCAP 248* 62* 120* 373* 355*  177*    Imaging Dg Chest Port 1 View  05/13/2016  CLINICAL DATA:  Patient with history of pneumonia. EXAM: PORTABLE CHEST 1 VIEW COMPARISON:  Chest radiograph 05/12/2016. FINDINGS: Interval extubation and removal of enteric tube. Stable cardiac and mediastinal contours. Multiple monitoring leads overlie the patient. Improved aeration of the left lower lung. Small layering right pleural effusion with underlying opacities favored to represent atelectasis. Unchanged right upper lobe parenchymal opacity. No pneumothorax. IMPRESSION: Interval extubation and removal of enteric tube. Small layering right pleural effusion with underlying opacities favored to represent atelectasis. Improved aeration of the left lung base. Electronically Signed   By: Lovey Newcomer M.D.   On: 05/13/2016 07:18  PCXR Aeration improved. Still w/ right basilar atx/effusion.   STUDIES:  03/27/12 PFT >> FEV1 1.08 (40%), FEV1% 40,  TLC 7.16 (119%), DLCO 67%, +BD  CULTURES: 5/30 Blood >> 5/30 Sputum: rarer GNR and rare GPR  ANTIBIOTICS: 5/30 Fortaz >> 5/30 Vancomycin >> 5/31  SIGNIFICANT EVENTS: 5/29 Admit 5/31 extubated; required NIPPV and extra diuresis that night.  6/1 getting lasix and cycling BIPAP. Still frail   LINES/TUBES: 5/30 ETT >> 5/31  DISCUSSION: 75 yo male with AECOPD, VDRF, HCAP.  Hx of NSCLC s/p XRT, systolic CHF, PAF. Extubated 5/31 but still w/ sig WOB. Will push diuresis as BUN/creatinine allows. Cycle NIPPV, cont current steroid dose and BDs. Needs negative volume status. Will look at right chest w/US to eval element of effusion.   ASSESSMENT / PLAN:  PULMONARY A: Acute on chronic hypoxic/hypercapnic respiratory failure 2nd to HCAP, AECOPD. Small right effusion -->extubated 5/31.  -->right basilar atx/effusion. Improved overall aeration but still w/ sig WOB and upper airway wheeze  P:   Cont lasix for negative volume status as tolerated Cycle BIPAP (On for 2 hours/off for one; then mandatory rest at night) Scheduled BDs Add flutter and IS Wean O2 Increase steroids  [place] right chest to eval effusion CXR PRN   CARDIOVASCULAR A:  Hx of PAF, HTN, HLD, chronic systolic CHF. P:  LMWH; eventually back to coumadin (likely 6/2 if respiratory status improves and no further procedures needed) Continue cardizem, digoxin, zocor Monitor hemodynamics Lasix for negative volume status  Repeat ECHO  RENAL A:   Hx of Lt nephrectomy. pulm edema component P:   Monitor renal fx, urine outpt, electrolytes lasix  GASTROINTESTINAL A:   Nutrition. P:   Diet as tolerated, npo for distress Protonix for SUP  HEMATOLOGIC A:   Hx of NSCLC (adenocarcinoma) s/p XRT. Coumadin at home ( fib) P:  F/u CBC Cont LMWH for now  INFECTIOUS A:   R/o HCAP >> has progression of RUL ASD. P:   Continue Abx Assess rt base pcxr in am   ENDOCRINE A:   Hx of DM with steroid induced  hyperglycemia.  P:   SSI Hold amaryl, glucophage  NEUROLOGIC A:   Acute metabolic encephalopathy. Physical deconditioning  P:   Hold outpt remeron, oxycodone PT consult    Erick Colace ACNP-BC [place] Pager # [phone] OR # [phone] if no answer  STAFF NOTE: I, Merrie Roof, MD FACP have personally reviewed patient's available data, including medical history, events of note, physical examination and test results as part of my evaluation. I have discussed with resident/NP and other care providers such as pharmacist, RN and RRT. In addition, I personally evaluated patient and elicited key findings of: noted events overnight, some increase wob, neg over 3 liters tolerated, doing well on NIMV, maintain  scheduled NIMV with max 1 hour interruption, pcxr in am , re increase steroids, maintain lasix, npo as may re require ETT, INR noted, lovenox scheduled for risk dvt in setting icu  / resp failure - cancer, if reintubated then bronch assessment, ceftaz mainatin The patient is critically ill with multiple organ systems failure and requires high complexity decision making for assessment and support, frequent evaluation and titration of therapies, application of advanced monitoring technologies and extensive interpretation of multiple databases.   Critical Care Time devoted to patient care services described in this note is 30 Minutes. This time reflects time of care of this signee: Merrie Roof, MD FACP. This critical care time does not reflect procedure time, or teaching time or supervisory time of PA/NP/Med student/Med Resident etc but could involve care discussion time. Rest per NP/medical resident whose note is outlined above and that I agree with   Lavon Paganini. Titus Mould, MD, [place] Pgr: [place] [facility] 05/13/2016 11:34 AM

## 2016-05-14 ENCOUNTER — Ambulatory Visit: Payer: Medicare Other | Admitting: Radiation Oncology

## 2016-05-14 ENCOUNTER — Ambulatory Visit: Payer: Medicare Other | Admitting: Pulmonary Disease

## 2016-05-14 ENCOUNTER — Inpatient Hospital Stay (HOSPITAL_COMMUNITY): Payer: Medicare Other

## 2016-05-14 ENCOUNTER — Inpatient Hospital Stay: Admit: 2016-05-14 | Payer: Medicare Other | Admitting: Radiation Oncology

## 2016-05-14 DIAGNOSIS — R0600 Dyspnea, unspecified: Secondary | ICD-10-CM

## 2016-05-14 LAB — GLUCOSE, CAPILLARY
GLUCOSE-CAPILLARY: 275 mg/dL — AB (ref 65–99)
GLUCOSE-CAPILLARY: 285 mg/dL — AB (ref 65–99)
GLUCOSE-CAPILLARY: 263 mg/dL — AB (ref 65–99)
Glucose-Capillary: 267 mg/dL — ABNORMAL HIGH (ref 65–99)

## 2016-05-14 LAB — BASIC METABOLIC PANEL
Anion gap: 8 (ref 5–15)
BUN: 18 mg/dL (ref 6–20)
CHLORIDE: 91 mmol/L — AB (ref 101–111)
CO2: 41 mmol/L — ABNORMAL HIGH (ref 22–32)
CREATININE: 0.84 mg/dL (ref 0.61–1.24)
Calcium: 8.8 mg/dL — ABNORMAL LOW (ref 8.9–10.3)
GFR calc Af Amer: >60 mL/min (ref >60)
GFR calc non Af Amer: >60 mL/min (ref >60)
Glucose, Bld: 247 mg/dL — ABNORMAL HIGH (ref 65–99)
Potassium: 4 mmol/L (ref 3.5–5.1)
SODIUM: 140 mmol/L (ref 135–145)

## 2016-05-14 LAB — CULTURE, RESPIRATORY

## 2016-05-14 LAB — PROTIME-INR
INR: 1.72 — ABNORMAL HIGH (ref 0.00–1.49)
Prothrombin Time: 20.2 seconds — ABNORMAL HIGH (ref 11.6–15.2)

## 2016-05-14 LAB — CULTURE, RESPIRATORY W GRAM STAIN: Culture: NORMAL

## 2016-05-14 LAB — ECHOCARDIOGRAM COMPLETE
HEIGHTINCHES: 70 in
Weight: 2536.17 oz

## 2016-05-14 MED ORDER — WARFARIN - PHARMACIST DOSING INPATIENT
Freq: Every day | Status: DC
Start: 2016-05-14 — End: 2016-05-17

## 2016-05-14 MED ORDER — FUROSEMIDE 10 MG/ML IJ SOLN
40.0000 mg | Freq: Three times a day (TID) | INTRAMUSCULAR | Status: AC
  Administered 2016-05-14 (×2): 40 mg via INTRAVENOUS
  Filled 2016-05-14 (×3): qty 4

## 2016-05-14 MED ORDER — WARFARIN SODIUM 5 MG PO TABS
5.0000 mg | ORAL_TABLET | Freq: Once | ORAL | Status: AC
  Administered 2016-05-14: 5 mg via ORAL
  Filled 2016-05-14 (×2): qty 1

## 2016-05-14 MED ORDER — METHYLPREDNISOLONE SODIUM SUCC 125 MG IJ SOLR
80.0000 mg | Freq: Two times a day (BID) | INTRAMUSCULAR | Status: DC
  Administered 2016-05-14 – 2016-05-17 (×6): 80 mg via INTRAVENOUS
  Filled 2016-05-14: qty 2
  Filled 2016-05-14: qty 1.28
  Filled 2016-05-14 (×5): qty 2

## 2016-05-14 MED ORDER — INSULIN GLARGINE 100 UNIT/ML ~~LOC~~ SOLN
20.0000 [IU] | Freq: Every day | SUBCUTANEOUS | Status: DC
  Administered 2016-05-14 – 2016-05-17 (×4): 20 [IU] via SUBCUTANEOUS
  Filled 2016-05-14 (×4): qty 0.2

## 2016-05-14 MED ORDER — POTASSIUM CHLORIDE 20 MEQ/15ML (10%) PO SOLN
40.0000 meq | Freq: Once | ORAL | Status: AC
  Administered 2016-05-14: 40 meq via ORAL
  Filled 2016-05-14: qty 30

## 2016-05-14 NOTE — Care Management Note (Signed)
Case Management Note  Patient Details  Name: Brent Wade MRN: [id number] Date of Birth: 08/03/1941  Subjective/Objective:    Pt admitted with shortness of breath  Plan:  Pt is from home alone PTA independent.  CM will continue to follow for discharge needs  Expected Discharge Date:                  Expected Discharge Plan:  Home/Self Care  In-House Referral:     Discharge planning Services  CM Consult  Post Acute Care Choice:    Choice offered to:     DME Arranged:    DME Agency:     HH Arranged:    HH Agency:     Status of Service:  In process, will continue to follow  Medicare Important Message Given:  Yes Date Medicare IM Given:    Medicare IM give by:    Date Additional Medicare IM Given:    Additional Medicare Important Message give by:     If discussed at [place] of Stay Meetings, dates discussed:    Additional Comments: 05/14/2016 Pt is extubated with BIPAP PRN.  PT ordered Maryclare Labrador, RN 05/14/2016, 10:04 AM

## 2016-05-14 NOTE — Progress Notes (Signed)
RN called RT to pt room due to Resp distress.  Pt was placed on BIPAP.

## 2016-05-14 NOTE — Progress Notes (Signed)
  Echocardiogram 2D Echocardiogram has been performed.  Brent Wade 05/14/2016, 10:48 AM

## 2016-05-14 NOTE — Care Management Important Message (Signed)
Important Message  Patient Details  Name: [name] MRN: [id number] Date of Birth: 1941/01/10   Medicare Important Message Given:  Yes    [name] 05/14/2016, 9:04 AM

## 2016-05-14 NOTE — Progress Notes (Signed)
ANTICOAGULATION CONSULT NOTE - Follow Up Consult  Pharmacy Consult for Lovenox Indication: atrial fibrillation  No Known Allergies  Patient Measurements: Height: '5\' 10"'$  (177.8 cm) Weight: 158 lb 8.2 oz (71.9 kg) IBW/kg (Calculated) : 73  Vital Signs: Temp: 98.4 F (36.9 C) (06/02 1127) Temp Source: Oral (06/02 1127) BP: 126/88 mmHg (06/02 1200) Pulse Rate: 113 (06/02 1200)  Labs:  Recent Labs  05/12/16 0458 05/12/16 1618 05/13/16 0330 05/14/16 0522  HGB  --   --  11.1*  --   HCT  --   --  36.4*  --   PLT  --   --  191  --   LABPROT 22.9*  --  19.4* 20.2*  INR 2.03*  --  1.64* 1.72*  CREATININE 0.89 0.91 1.03 0.84    Estimated Creatinine Clearance: 76.1 mL/min (by C-G formula based on Cr of 0.84).   Medications:  Prior to Admit - Coumadin 7.'5mg'$  on Sundays, '5mg'$  all other days  Assessment: 75 year old male admitted with COPD exacerbation on chronic warfarin for atrial fibrillation PTA. Pharmacy consulted to dose warfarin.  INR 1.72 today. LFTs wnl, Hgb 11.1, plt wnl yesterday. Last warfarin dose 5/28. Currently on enox 75 mg BID. Starting warfarin tonight with enoxaparin bridge. No noted bleeding.  Goal of Therapy:  Anti-Xa level 0.6-1 units/ml 4hrs after LMWH dose given Monitor platelets by anticoagulation protocol: Yes   Plan:  Enoxaparin 1 mg/kg (75 mg)  SQ q12h  Warfarin 5 mg x 1 tonight Daily INR CBC at least q72h Monitor for signs and symptoms of bleeding  Heloise Ochoa, [place].D., BCPS PGY2 Cardiology Pharmacy Resident Pager: [phone]  05/14/2016, 12:58 PM

## 2016-05-14 NOTE — Progress Notes (Addendum)
PULMONARY / CRITICAL CARE MEDICINE   Name: Brent Wade MRN: [id number] DOB: October 05, 1941    ADMISSION DATE:  05/10/2016  REFERRING MD:  Dr. Rogene Houston  CHIEF COMPLAINT:  Short of breath  HISTORY OF PRESENT ILLNESS:   Hx from chart.  75 yo male brought to ER with progressive dyspnea.  He was also hypoxic.  He took pain medication earlier in the day.  He was found to be lethargic and had respiratory acidosis on ABG.  He was tried on BiPAP but continued to get worse.  He required intubation.  He has hx of GOLD D COPD, NSCLC s/p XRT, systolic CHF, PAF on coumadin.  He was in hospital recently for AECOPD.   SUBJECTIVE:  Reports shortness of breath has improved VITAL SIGNS: BP 117/74 mmHg  Pulse 102  Temp(Src) 98.8 F (37.1 C) (Oral)  Resp 20  Ht '5\' 10"'$  (1.778 m)  Wt 158 lb 8.2 oz (71.9 kg)  BMI 22.74 kg/m2  SpO2 98% 5 liters  HEMODYNAMICS:    VENTILATOR SETTINGS: Vent Mode:  [-] BIPAP FiO2 (%):  [40 %] 40 % Set Rate:  [14 bmp] 14 bmp PEEP:  [5 cmH20] 5 cmH20  INTAKE / OUTPUT:  Intake/Output Summary (Last 24 hours) at 05/14/16 0913 Last data filed at 05/14/16 0900  Gross per 24 hour  Intake    350 ml  Output   3295 ml  Net  -2945 ml     PHYSICAL EXAMINATION: General: frail; 75 year old male, WOB improved  Neuro: awake, f/c; appropriate and oriented x 3 HEENT:  NCAT, MMM, no JVD Cardiovascular:  Irregular no MRG Lungs:  Prolonged exhalation, occasional upper airway wheeze & posterior exp wheeze  Abdomen:  Soft, non tender Musculoskeletal:  3+ edema-->improved Skin:  No rashes  LABS:  BMET  Recent Labs Lab 05/12/16 1618 05/13/16 0330 05/14/16 0522  NA 139 141 140  K 3.7 4.0 4.0  CL 98* 99* 91*  CO2 32 36* 41*  BUN '18 19 18  '$ CREATININE 0.91 1.03 0.84  GLUCOSE 373* 267* 247*    Electrolytes  Recent Labs Lab 05/11/16 0422 05/11/16 1753 05/12/16 0458 05/12/16 1618 05/13/16 0330 05/14/16 0522  CALCIUM 8.4*  --  8.6* 8.4* 8.8* 8.8*  MG  1.8 2.0 2.0  --   --   --   PHOS 3.8 2.6 2.7  --   --   --     CBC  Recent Labs Lab 05/10/16 2255 05/11/16 0422 05/13/16 0330  WBC 7.1 6.3 8.7  HGB 12.7* 11.2* 11.1*  HCT 42.1 37.0* 36.4*  PLT 238 186 191    Coag's  Recent Labs Lab 05/12/16 0458 05/13/16 0330 05/14/16 0522  INR 2.03* 1.64* 1.72*    Sepsis Markers  Recent Labs Lab 05/11/16 0133 05/11/16 0427 05/12/16 0458 05/13/16 0330  LATICACIDVEN 2.0 2.1*  --   --   PROCALCITON <0.10  --  <0.10 <0.10    ABG  Recent Labs Lab 05/10/16 2358 05/11/16 0152 05/11/16 0448  PHART 7.176* 7.253* 7.345*  PCO2ART 97.7* 75.8* 57.6*  PO2ART 164.0* 107.0* 116.0*    Liver Enzymes  Recent Labs Lab 05/10/16 2255 05/11/16 0133  AST 19 23  ALT 20 20  ALKPHOS 58 55  BILITOT 0.4 0.6  ALBUMIN 3.8 3.5    Cardiac Enzymes No results for input(s): TROPONINI, PROBNP in the last 168 hours.  Glucose  Recent Labs Lab 05/12/16 2150 05/13/16 0756 05/13/16 1132 05/13/16 1521 05/13/16 2219 05/14/16 0747  GLUCAP  177* 259* 326* 310* 239* 275*    Imaging Dg Chest Port 1 View  05/14/2016  CLINICAL DATA:  Follow-up of pneumonia, acute and chronic respiratory failure, history of COPD and lung malignancy EXAM: PORTABLE CHEST 1 VIEW COMPARISON:  Portable chest x-ray of May 13, 2016. FINDINGS: The lungs are well-expanded. Persistent confluent alveolar opacities are present in the left upper hemithorax. There is a stable small left pleural effusion. There is stable linear density at the left lung base. The heart is top-normal in size. The pulmonary vascularity is not engorged. The trachea is midline. The bony thorax exhibits no acute abnormality. IMPRESSION: Bibasilar atelectasis with small right pleural effusion. Stable alveolar opacity in the right upper lobe. Electronically Signed   By: David  Martinique M.D.   On: 05/14/2016 07:43  PCXR Aeration improved. Still w/ right basilar atx/effusion. But improved.   STUDIES:   03/27/12 PFT >> FEV1 1.08 (40%), FEV1% 40, TLC 7.16 (119%), DLCO 67%, +BD  CULTURES: 5/30 Blood >> 5/30 Sputum: rarer GNR and rare GPR  ANTIBIOTICS: 5/30 Fortaz >> 5/30 Vancomycin >> 5/31  SIGNIFICANT EVENTS: 5/29 Admit 5/31 extubated; required NIPPV and extra diuresis that night.  6/1 getting lasix and cycling BIPAP. Still frail 6/2 looking better. BIPAP changed to PRN and HS. Cont lasix. ECHO still pending. Getting OOB   LINES/TUBES: 5/30 ETT >> 5/31  DISCUSSION: 75 yo male with AECOPD, VDRF, HCAP.  Hx of NSCLC s/p XRT, systolic CHF, PAF. Extubated 5/3. CXR slowly clearing. Clinically looks better w/ lasix. Required NIPPV for most of the day 6/1, but looks like we can de-escalate this. For today plan is: de-escalate NIPPV, cont steroids, BDs, ABX and lasix. He needs to get OOB. Will allow him to increase diet. We will need to CT his chest at some point when he is stronger to f/u his cancer status.    ASSESSMENT / PLAN:  PULMONARY A: Acute on chronic hypoxic/hypercapnic respiratory failure 2nd to HCAP, AECOPD. Small right effusion -->extubated 5/31.  -->right basilar atx/effusion. Improved overall aeration & WOB P:   Cont lasix for negative volume status as tolerated Change BIPAP to PRN during day & at HS  Scheduled BDs Cont flutter and IS Wean O2 Increase steroids  mobilize CXR PRN   CARDIOVASCULAR A:  Hx of PAF, HTN, HLD, chronic systolic CHF (EF 02-54%). P:  Resume coumadin w/ LMWH bridge  Continue cardizem, digoxin, zocor Lasix for negative volume status  F/u ECHO  RENAL A:   Hx of Lt nephrectomy. pulm edema component P:   Monitor renal fx, urine outpt, electrolytes Lasix as BUN/creatinine allow   GASTROINTESTINAL A:   Nutrition. P:   Adv diet PPI for GERD component   HEMATOLOGIC A:   Hx of NSCLC (adenocarcinoma) s/p XRT. Coumadin at home ( fib) P:  F/u CBC Resume Coumadin   INFECTIOUS A:   R/o HCAP >> has progression of RUL ASD. P:    Continue Abx Assess rt base pcxr in am   ENDOCRINE A:   Hx of DM with steroid induced hyperglycemia.  P:   SSI Hold amaryl, glucophage  NEUROLOGIC A:   Acute metabolic encephalopathy-->resolved Physical deconditioning  P:   Hold outpt remeron, oxycodone PT consult  [place] ACNP-BC [place] Pager # [phone] OR # [phone] if no answer   05/14/2016 9:13 AM  STAFF NOTE: Linwood Dibbles, MD FACP have personally reviewed patient's available data, including medical history, events of note, physical examination and  test results as part of my evaluation. I have discussed with resident/NP and other care providers such as pharmacist, RN and RRT. In addition, I personally evaluated patient and elicited key findings of: much improved after neg 2.6 liters, less coarse, pcxr also improved with diuretics, pct neg consider lower ABX course to 5 days, steroid slow reduction,  Dc NIMV not needed, has clinically resolving, mostly from lasix, mobilize, to triad, lantus increase  Lavon Paganini. Titus Mould, MD, [place] Pgr: [place] [facility] 05/14/2016 9:25 AM

## 2016-05-14 NOTE — Evaluation (Signed)
Physical Therapy Evaluation Patient Details Name: Brent Wade MRN: [id number] DOB: [birth year] Today's Date: 05/14/2016   History of Present Illness  75 yo male brought to ER with progressive dyspnea. He was also hypoxic. He took pain medication earlier in the day. He was found to be lethargic and had respiratory acidosis on ABG. He was tried on BiPAP but continued to get worse. He required intubation.VDRF 5/30-5/31.  Still on and off bipap since.   Clinical Impression  Pt admitted with above diagnosis. Pt currently with functional limitations due to the deficits listed below (see PT Problem List). Pt was able to transfer OOB to chair with mod assist and cues.  Pt weak with decr steadiness on his feet. Sats dropped to 88% during transfer even with 5LO2.  May need SNF if family cannot provide 24 hour care. Will follow acutely.   Pt will benefit from skilled PT to increase their independence and safety with mobility to allow discharge to the venue listed below.      Follow Up Recommendations Home health PT;Supervision/Assistance - 24 hour (If pt does not have 24 hour care, will need SNF)    Equipment Recommendations  Other (comment) (TBA)    Recommendations for Other Services       Precautions / Restrictions Precautions Precautions: Fall Restrictions Weight Bearing Restrictions: No      Mobility  Bed Mobility Overal bed mobility: Needs Assistance Bed Mobility: Supine to Sit     Supine to sit: Min assist     General bed mobility comments: Needed assist for LEs and for elevation of trunk.  Transfers Overall transfer level: Needs assistance Equipment used: 2 person hand held assist Transfers: Sit to/from [facility] Sit to Stand: Mod assist;+2 physical assistance Stand pivot transfers: Min assist;+2 physical assistance       General transfer comment: Pt needed assist to power up and cues for hand placement as well as assist for anterior translation as  pt with posterior lean.  Relied on bil UE support for transfer. Pt slightly unsteady on his feet.   Ambulation/Gait                Stairs            Wheelchair Mobility    Modified Rankin (Stroke Patients Only)       Balance Overall balance assessment: Needs assistance Sitting-balance support: No upper extremity supported;Feet supported Sitting balance-Leahy Scale: Fair   Postural control: Posterior lean Standing balance support: Bilateral upper extremity supported;During functional activity Standing balance-Leahy Scale: Poor Standing balance comment: relies on UE support.  Pt with posterior lean.                              Pertinent Vitals/Pain Pain Assessment: No/denies pain  106-111 bpm, 99% on 4.5LO2 initially.  O2 dropped to 88-89% on 5LO2 with transfer but recovered quickly once sitting to 91% and >.  BP 149/97 initially and 126/88 after transfer.      Home Living Family/patient expects to be discharged to:: Private residence Living Arrangements: Children Available Help at Discharge: Family;Available PRN/intermittently Type of Home: House Home Access: Ramped entrance     Home Layout: One level Home Equipment: Walker - 4 wheels Additional Comments: States daughter in law and granddaughter can stay with him. Son drives a truck.     Prior Function Level of Independence: Independent  Hand Dominance        Extremity/Trunk Assessment   Upper Extremity Assessment: Defer to OT evaluation           Lower Extremity Assessment: Generalized weakness      Cervical / Trunk Assessment: Normal  Communication   Communication: No difficulties  Cognition Arousal/Alertness: Awake/alert Behavior During Therapy: WFL for tasks assessed/performed Overall Cognitive Status: Within Functional Limits for tasks assessed                      General Comments      Exercises General Exercises - Upper  Extremity Shoulder Flexion: AROM;Both;5 reps;Seated General Exercises - Lower Extremity Ankle Circles/Pumps: AROM;Both;5 reps;Seated Long Arc Quad: AROM;Both;10 reps;Seated Hip Flexion/Marching: AROM;Both;10 reps;Seated      Assessment/Plan    PT Assessment Patient needs continued PT services  PT Diagnosis Generalized weakness   PT Problem List Decreased activity tolerance;Decreased balance;Decreased mobility;Decreased knowledge of use of DME;Decreased safety awareness;Decreased knowledge of precautions  PT Treatment Interventions DME instruction;Gait training;Functional mobility training;Therapeutic activities;Therapeutic exercise;Balance training;Patient/family education   PT Goals (Current goals can be found in the Care Plan section) Acute Rehab PT Goals Patient Stated Goal: to get better PT Goal Formulation: With patient Time For Goal Achievement: 05/28/16 Potential to Achieve Goals: Good    Frequency Min 3X/week   Barriers to discharge        Co-evaluation               End of Session Equipment Utilized During Treatment: Gait belt;Oxygen Activity Tolerance: Patient limited by fatigue Patient left: in chair;with call bell/phone within reach Nurse Communication: Mobility status         Time: 1200-1212 PT Time Calculation (min) (ACUTE ONLY): 12 min   Charges:   PT Evaluation $PT Eval Moderate Complexity: 1 Procedure     PT G CodesDenice Paradise 13-Jun-2016, 1:34 PM Erwin Fidela Cieslak,PT Acute Rehabilitation [phone] (pager)

## 2016-05-15 ENCOUNTER — Inpatient Hospital Stay (HOSPITAL_COMMUNITY): Payer: Medicare Other

## 2016-05-15 DIAGNOSIS — J441 Chronic obstructive pulmonary disease with (acute) exacerbation: Secondary | ICD-10-CM

## 2016-05-15 DIAGNOSIS — G9340 Encephalopathy, unspecified: Secondary | ICD-10-CM

## 2016-05-15 LAB — CBC
HEMATOCRIT: 38.3 % — AB (ref 39.0–52.0)
HEMOGLOBIN: 11.5 g/dL — AB (ref 13.0–17.0)
MCH: 28.8 pg (ref 26.0–34.0)
MCHC: 30 g/dL (ref 30.0–36.0)
MCV: 95.8 fL (ref 78.0–100.0)
Platelets: 181 10*3/uL (ref 150–400)
RBC: 4 MIL/uL — AB (ref 4.22–5.81)
RDW: 14 % (ref 11.5–15.5)
WBC: 7.3 10*3/uL (ref 4.0–10.5)

## 2016-05-15 LAB — GLUCOSE, CAPILLARY
GLUCOSE-CAPILLARY: 162 mg/dL — AB (ref 65–99)
GLUCOSE-CAPILLARY: 253 mg/dL — AB (ref 65–99)
GLUCOSE-CAPILLARY: 198 mg/dL — AB (ref 65–99)
GLUCOSE-CAPILLARY: 212 mg/dL — AB (ref 65–99)

## 2016-05-15 LAB — BASIC METABOLIC PANEL
ANION GAP: 7 (ref 5–15)
BUN: 19 mg/dL (ref 6–20)
CHLORIDE: 88 mmol/L — AB (ref 101–111)
CO2: 47 mmol/L — AB (ref 22–32)
Calcium: 9.1 mg/dL (ref 8.9–10.3)
Creatinine, Ser: 0.8 mg/dL (ref 0.61–1.24)
GFR calc non Af Amer: >60 mL/min (ref >60)
Glucose, Bld: 134 mg/dL — ABNORMAL HIGH (ref 65–99)
POTASSIUM: 4.5 mmol/L (ref 3.5–5.1)
SODIUM: 142 mmol/L (ref 135–145)

## 2016-05-15 LAB — PHOSPHORUS: PHOSPHORUS: 3.4 mg/dL (ref 2.5–4.6)

## 2016-05-15 LAB — PROTIME-INR
INR: 1.96 — AB (ref 0.00–1.49)
PROTHROMBIN TIME: 22.3 s — AB (ref 11.6–15.2)

## 2016-05-15 LAB — MAGNESIUM: MAGNESIUM: 2.1 mg/dL (ref 1.7–2.4)

## 2016-05-15 MED ORDER — WARFARIN SODIUM 5 MG PO TABS
5.0000 mg | ORAL_TABLET | Freq: Once | ORAL | Status: AC
  Administered 2016-05-15: 5 mg via ORAL
  Filled 2016-05-15: qty 1

## 2016-05-15 MED ORDER — INSULIN ASPART 100 UNIT/ML ~~LOC~~ SOLN
3.0000 [IU] | Freq: Three times a day (TID) | SUBCUTANEOUS | Status: DC
  Administered 2016-05-15 – 2016-05-17 (×5): 3 [IU] via SUBCUTANEOUS

## 2016-05-15 NOTE — Progress Notes (Addendum)
PULMONARY / CRITICAL CARE MEDICINE   Name: Brent Wade MRN: [id number] DOB: May 15, 1941    ADMISSION DATE:  05/10/2016  REFERRING MD:  Dr. Rogene Houston  CHIEF COMPLAINT:  Short of breath  HISTORY OF PRESENT ILLNESS:   Hx from chart.  75 yo male brought to ER with progressive dyspnea.  He was also hypoxic.  He took pain medication earlier in the day.  He was found to be lethargic and had respiratory acidosis on ABG.  He was tried on BiPAP but continued to get worse.  He required intubation.  He has hx of GOLD D COPD, NSCLC s/p XRT, systolic CHF, PAF on coumadin.  He was in hospital recently for AECOPD.   SUBJECTIVE:  Looks comfortable VITAL SIGNS: BP 151/80 mmHg  Pulse 89  Temp(Src) 98.7 F (37.1 C) (Oral)  Resp 21  Ht '5\' 6"'$  (1.676 m)  Wt 152 lb 9.6 oz (69.219 kg)  BMI 24.64 kg/m2  SpO2 91% 5 liters  HEMODYNAMICS:    VENTILATOR SETTINGS: Vent Mode:  [-]  FiO2 (%):  [40 %] 40 %  INTAKE / OUTPUT:  Intake/Output Summary (Last 24 hours) at 05/15/16 1307 Last data filed at 05/15/16 1155  Gross per 24 hour  Intake    600 ml  Output   1475 ml  Net   -875 ml     PHYSICAL EXAMINATION: General: frail; 75 year old male, NAD at rest Neuro: awake, f/c; appropriate and oriented x 3 HEENT:  NCAT, MMM, no JVD Cardiovascular:  Irregular no MRG Lungs:  Prolonged exhalation,decreased bs bases Abdomen:  Soft, non tender Musculoskeletal:  3+ edema-->improved Skin:  No rashes  LABS:  BMET  Recent Labs Lab 05/13/16 0330 05/14/16 0522 05/15/16 0348  NA 141 140 142  K 4.0 4.0 4.5  CL 99* 91* 88*  CO2 36* 41* 47*  BUN '19 18 19  '$ CREATININE 1.03 0.84 0.80  GLUCOSE 267* 247* 134*    Electrolytes  Recent Labs Lab 05/11/16 1753 05/12/16 0458  05/13/16 0330 05/14/16 0522 05/15/16 0348  CALCIUM  --  8.6*  < > 8.8* 8.8* 9.1  MG 2.0 2.0  --   --   --  2.1  PHOS 2.6 2.7  --   --   --  3.4  < > = values in this interval not displayed.  CBC  Recent Labs Lab  05/11/16 0422 05/13/16 0330 05/15/16 0348  WBC 6.3 8.7 7.3  HGB 11.2* 11.1* 11.5*  HCT 37.0* 36.4* 38.3*  PLT 186 191 181    Coag's  Recent Labs Lab 05/13/16 0330 05/14/16 0522 05/15/16 0348  INR 1.64* 1.72* 1.96*    Sepsis Markers  Recent Labs Lab 05/11/16 0133 05/11/16 0427 05/12/16 0458 05/13/16 0330  LATICACIDVEN 2.0 2.1*  --   --   PROCALCITON <0.10  --  <0.10 <0.10    ABG  Recent Labs Lab 05/10/16 2358 05/11/16 0152 05/11/16 0448  PHART 7.176* 7.253* 7.345*  PCO2ART 97.7* 75.8* 57.6*  PO2ART 164.0* 107.0* 116.0*    Liver Enzymes  Recent Labs Lab 05/10/16 2255 05/11/16 0133  AST 19 23  ALT 20 20  ALKPHOS 58 55  BILITOT 0.4 0.6  ALBUMIN 3.8 3.5    Cardiac Enzymes No results for input(s): TROPONINI, PROBNP in the last 168 hours.  Glucose  Recent Labs Lab 05/14/16 0747 05/14/16 1126 05/14/16 1650 05/14/16 2115 05/15/16 0740 05/15/16 1121  GLUCAP 275* 267* 285* 263* 162* 253*    Imaging No results found.PCXR Aeration  improved. Still w/ right basilar atx/effusion. But improved.   STUDIES:  03/27/12 PFT >> FEV1 1.08 (40%), FEV1% 40, TLC 7.16 (119%), DLCO 67%, +BD  CULTURES: 5/30 Blood >> 5/30 Sputum: rarer GNR and rare GPR>>nl flora  ANTIBIOTICS: 5/30 Fortaz >> 5/30 Vancomycin >> 5/31  SIGNIFICANT EVENTS: 5/29 Admit 5/31 extubated; required NIPPV and extra diuresis that night.  6/1 getting lasix and cycling BIPAP. Still frail 6/2 looking better. BIPAP changed to PRN and HS. Cont lasix. ECHO still pending. Getting OOB  6/3 PCCM asked to reevaluate for resp distress. No distress on exam.  LINES/TUBES: 5/30 ETT >> 5/31  DISCUSSION: 75 yo male with AECOPD, VDRF, HCAP.  Hx of NSCLC s/p XRT, systolic CHF, PAF. Extubated 5/3. CXR slowly clearing. Clinically looks better w/ lasix. Required NIPPV for most of the day 6/1, but looks like we can de-escalate this. For today plan is: de-escalate NIPPV, cont steroids, BDs, ABX and  lasix. He needs to get OOB. Will allow him to increase diet. We will need to CT his chest at some point when he is stronger to f/u his cancer status. He can use bipap for comfort.  Intake/Output Summary (Last 24 hours) at 05/15/16 1310 Last data filed at 05/15/16 1155  Gross per 24 hour  Intake    600 ml  Output   1475 ml  Net   -875 ml     ASSESSMENT / PLAN:  PULMONARY A: Acute on chronic hypoxic/hypercapnic respiratory failure 2nd to HCAP, AECOPD. Small right effusion -->extubated 5/31.  -->right basilar atx/effusion. Improved overall aeration & WOB P:   Cont lasix for negative volume status as tolerated Change BIPAP to PRN during day & at HS  Scheduled BDs Cont flutter and IS Wean O2 Increase steroids  mobilize CXR PRN   CARDIOVASCULAR A:  Hx of PAF, HTN, HLD, chronic systolic CHF (EF 09-62%). P:  Resume coumadin w/ LMWH bridge  Continue cardizem, digoxin, zocor Lasix for negative volume status  F/u ECHO  RENAL Lab Results  Component Value Date   CREATININE 0.80 05/15/2016   CREATININE 0.84 05/14/2016   CREATININE 1.03 05/13/2016   CREATININE 0.97 03/12/2014   CREATININE 1.1 11/26/2013    A:   Hx of Lt nephrectomy. pulm edema component P:   Monitor renal fx, urine outpt, electrolytes Lasix as BUN/creatinine allow   GASTROINTESTINAL A:   Nutrition. P:   Adv diet PPI for GERD component   HEMATOLOGIC A:   Hx of NSCLC (adenocarcinoma) s/p XRT. Coumadin at home ( fib) P:  F/u CBC Resume Coumadin   INFECTIOUS A:   R/o HCAP >> has progression of RUL ASD. P:   Continue Abx Assess rt base pcxr in am   ENDOCRINE A:   Hx of DM with steroid induced hyperglycemia.  P:   SSI Hold amaryl, glucophage  NEUROLOGIC A:   Acute metabolic encephalopathy-->resolved Physical deconditioning  P:   Hold outpt remeron, oxycodone PT consult  OOB   6/3 called back for resp distress. He required nocturnal bipap x 4 hours. Currently stable. Continue  bipap as needed.   Richardson Landry Minor ACNP Maryanna Shape PCCM Pager [phone] till 3 pm If no answer page [phone] 05/15/2016, 1:07 PM   Attending note: I have seen and examined the patient with nurse practitioner/resident and agree with the note. History, labs and imaging reviewed.  75 Y/O with advanced COPD, Multiple recent exacerbations. Transferred out of ICU yesterday. Still with increased WOB, requiring Bipap.  We need to have goals  of care discussion with him and family and consider limitations of treatments, DNR.DNI as he had a declining course. He may have recurrent lung cancer but has refused salvage chemotherapy. I can meet with the family tomorrow.   Marshell Garfinkel MD [facility] Pager [phone] If no answer or after 3pm call: [phone] 05/15/2016, 5:40 PM

## 2016-05-15 NOTE — Progress Notes (Addendum)
PROGRESS NOTE  Brent Wade  YOV:[id number] DOB: 1941-02-02  DOA: 05/10/2016 PCP: Simona Huh, MD   Brief Narrative:  75 year old male with a PMH of GOLD D COPD, NSCLC s/p XRT, systolic CHF, PAF on coumadin, recently hospitalized for AECOPD, presented to ED with progressive dyspnea and hypoxia. He took pain medication earlier that day. He was found to be lethargic and had respiratory acidosis on ABG. He was tried on BiPAP but continued to get worse. He required intubation and ICU admission by CCM for AECOPD, VDRF. Extubated 5/31. Felt to have improved. Transferred to floor and TRH on 6/3. Overnight 6/2, issues with dyspnea requiring BiPAP. Requested CCM to continue to follow on 6/3.   Assessment & Plan:   Active Problems:   Acute respiratory failure (HCC)   Acidemia   Acute on chronic respiratory failure with hypoxia and hypercapnia (HCC)   Delirium   Pneumonia   Acute on chronic hypoxic and hypercapnic respiratory failure d/t AECOPD & HCAP - Required intubation and management by CCM in ICU. Extubated 5/31. - Continue antibiotics (IV Tressie Ellis), IV Solu-Medrol, oxygen, when necessary BiPAP, bronchodilators, flutter valve and incentive spirometry. Repeat chest x-ray this morning. Consider repeating IV Lasix for negative volume. - Requested CCM to reassess and continue to follow on 6/3. - Blood cultures negative to date. MRSA screen negative - Metabolic alkalosis/bicarbonate 47-multifactorial related to secondary respiratory compensation and diuresis.? Trial of a dose of acetazolamide.  Paroxysmal A. Fib - Controlled ventricular rate on monitor. INR almost therapeutic/1.94. On Lovenox bridge until INR therapeutic >2. - Continue diltiazem, digoxin. - When necessary IV Lasix  Essential hypertension - Controlled  Chronic systolic CHF - No peripheral edema. Does not seem overtly volume overloaded. Repeat chest x-ray and when necessary Lasix. - 2-D echo: Normal EF. Results as  below.  Hyperlipidemia - Continue statins  History of left nephrectomy - Creatinine normal. Monitor BMP periodically.  GERD - PPI.  NSCLC (adenocarcinoma) s/p XRT.  Type II DM/steroid-induced hyperglycemia - Hold Amaryl and Glucophage. - On Lantus and SSI. Adjust insulin's-added mealtime NovoLog. CBGs currently in the 200s. Check X4J  Acute metabolic encephalopathy - Resolved  Physical deconditioning - PT recommending home health with 24-hour supervision/assistance-if does not have then will need SNF.  Anemia - Stable. Follow CBCs    DVT prophylaxis: Lovenox bridging and Coumadin Code Status: Full Family Communication: Discussed with patient. No family at bedside. Disposition Plan: DC home versus SNF when medically stable.   Consultants:   CCM  Procedures:   Extubated 5/31  2-D echo 05/14/69: Study Conclusions  - Left ventricle: The cavity size was normal. Systolic function was  normal. The estimated ejection fraction was in the range of 55%  to 60%. Wall motion was normal; there were no regional wall  motion abnormalities. - Aortic valve: Moderate diffuse thickening and calcification,  consistent with sclerosis. The left and noncoronary cusps are  heavily calcified with reduced leaflet excursion. - Tricuspid valve: There was trivial regurgitation.  Antimicrobials:   IV Tressie Ellis 5/29 >consider discontinuing after 6/4 dose  IV vancomycin 5/29 > 5/30    Subjective: Overall feels much better than on admission. Still with significant dyspnea on exertion. As per RN, required BiPAP overnight. Minimal nonproductive cough. No chest pain reported.  Objective:  Filed Vitals:   05/15/16 0800 05/15/16 0815 05/15/16 1134 05/15/16 1235  BP:  135/83 151/80   Pulse:  95 89   Temp:  98.1 F (36.7 C) 98.7 F (37.1 C)   TempSrc:  Oral Oral   Resp:      Height:      Weight:      SpO2: 90% 95% 94% 91%    Intake/Output Summary (Last 24 hours) at 05/15/16  1249 Last data filed at 05/15/16 1155  Gross per 24 hour  Intake    610 ml  Output   2175 ml  Net  -1565 ml   Filed Weights   05/14/16 0500 05/14/16 1409 05/15/16 0500  Weight: 71.9 kg (158 lb 8.2 oz) 69.219 kg (152 lb 9.6 oz) 69.219 kg (152 lb 9.6 oz)    Examination:  General exam: Pleasant elderly male, moderately built, extremely frail, chronically ill-looking lying propped up in bed with mild intermittent increased work of breathing related to exertion. Respiratory system: Reduced breath sounds bilaterally with scattered occasional expiratory rhonchi and basal crackles. Mild increased work of breathing. Cardiovascular system: S1 & S2 heard, irregularly irregular. No JVD, murmurs, rubs, gallops or clicks. No pedal edema. Gastrointestinal system: Abdomen is nondistended, soft and nontender. No organomegaly or masses felt. Normal bowel sounds heard. Central nervous system: Alert and oriented. No focal neurological deficits. Extremities: Symmetric 5 x 5 power. Skin: No rashes, lesions or ulcers Psychiatry: Judgement and insight appear normal. Mood & affect appropriate.     Data Reviewed: I have personally reviewed following labs and imaging studies  CBC:  Recent Labs Lab 05/10/16 2255 05/11/16 0422 05/13/16 0330 05/15/16 0348  WBC 7.1 6.3 8.7 7.3  NEUTROABS 5.3  --   --   --   HGB 12.7* 11.2* 11.1* 11.5*  HCT 42.1 37.0* 36.4* 38.3*  MCV 97.2 95.6 95.8 95.8  PLT 238 186 191 812   Basic Metabolic Panel:  Recent Labs Lab 05/11/16 0133 05/11/16 0422 05/11/16 1753 05/12/16 0458 05/12/16 1618 05/13/16 0330 05/14/16 0522 05/15/16 0348  NA 138 140  --  138 139 141 140 142  K 4.8 4.5  --  3.8 3.7 4.0 4.0 4.5  CL 97* 102  --  99* 98* 99* 91* 88*  CO2 34* 30  --  32 32 36* 41* 47*  GLUCOSE 209* 201*  --  239* 373* 267* 247* 134*  BUN 12 12  --  _0 CREATININE 1.00 0.89  --  0.89 0.91 1.03 0.84 0.80  CALCIUM 8.7* 8.4*  --  8.6* 8.4* 8.8* 8.8* 9.1  MG 1.9  1.8 2.0 2.0  --   --   --  2.1  PHOS 4.0 3.8 2.6 2.7  --   --   --  3.4   GFR: Estimated Creatinine Clearance: 70.9 mL/min (by C-G formula based on Cr of 0.8). Liver Function Tests:  Recent Labs Lab 05/10/16 2255 05/11/16 0133  AST 19 23  ALT 20 20  ALKPHOS 58 55  BILITOT 0.4 0.6  PROT 6.2* 5.9*  ALBUMIN 3.8 3.5   No results for input(s): LIPASE, AMYLASE in the last 168 hours. No results for input(s): AMMONIA in the last 168 hours. Coagulation Profile:  Recent Labs Lab 05/11/16 0133 05/12/16 0458 05/13/16 0330 05/14/16 0522 05/15/16 0348  INR 2.54* 2.03* 1.64* 1.72* 1.96*   Cardiac Enzymes: No results for input(s): CKTOTAL, CKMB, CKMBINDEX, TROPONINI in the last 168 hours. BNP (last 3 results) No results for input(s): PROBNP in the last 8760 hours. HbA1C: No results for input(s): HGBA1C in the last 72 hours. CBG:  Recent Labs Lab 05/14/16 1126 05/14/16 1650 05/14/16 2115 05/15/16 0740 05/15/16 1121  GLUCAP 267* 285*  263* 162* 253*   Lipid Profile: No results for input(s): CHOL, HDL, LDLCALC, TRIG, CHOLHDL, LDLDIRECT in the last 72 hours. Thyroid Function Tests: No results for input(s): TSH, T4TOTAL, FREET4, T3FREE, THYROIDAB in the last 72 hours. Anemia Panel: No results for input(s): VITAMINB12, FOLATE, FERRITIN, TIBC, IRON, RETICCTPCT in the last 72 hours.  Sepsis Labs:  Recent Labs Lab 05/11/16 0133 05/11/16 0427 05/12/16 0458 05/13/16 0330  PROCALCITON <0.10  --  <0.10 <0.10  LATICACIDVEN 2.0 2.1*  --   --     Recent Results (from the past 240 hour(s))  Culture, blood (Routine X 2) w Reflex to ID Panel     Status: None (Preliminary result)   Collection Time: 05/11/16  1:30 AM  Result Value Ref Range Status   Specimen Description BLOOD RIGHT FOREARM  Final   Special Requests IN PEDIATRIC BOTTLE 1ML  Final   Culture NO GROWTH 4 DAYS  Final   Report Status PENDING  Incomplete  Culture, blood (Routine X 2) w Reflex to ID Panel     Status:  None (Preliminary result)   Collection Time: 05/11/16  1:55 AM  Result Value Ref Range Status   Specimen Description BLOOD LEFT ARM  Final   Special Requests IN PEDIATRIC BOTTLE 4ML  Final   Culture NO GROWTH 4 DAYS  Final   Report Status PENDING  Incomplete  MRSA PCR Screening     Status: None   Collection Time: 05/11/16  4:02 AM  Result Value Ref Range Status   MRSA by PCR NEGATIVE NEGATIVE Final    Comment:        The GeneXpert MRSA Assay (FDA approved for NASAL specimens only), is one component of a comprehensive MRSA colonization surveillance program. It is not intended to diagnose MRSA infection nor to guide or monitor treatment for MRSA infections.   Culture, respiratory (NON-Expectorated)     Status: None   Collection Time: 05/11/16  4:54 AM  Result Value Ref Range Status   Specimen Description ENDOTRACHEAL  Final   Special Requests NONE  Final   Gram Stain   Final    FEW WBC PRESENT, PREDOMINANTLY PMN FEW SQUAMOUS EPITHELIAL CELLS PRESENT ABUNDANT GRAM POSITIVE COCCI IN PAIRS FEW GRAM NEGATIVE RODS RARE YEAST RARE GRAM POSITIVE RODS    Culture Consistent with normal respiratory flora.  Final   Report Status 05/14/2016 FINAL  Final         Radiology Studies: Dg Chest Port 1 View  05/14/2016  CLINICAL DATA:  Follow-up of pneumonia, acute and chronic respiratory failure, history of COPD and lung malignancy EXAM: PORTABLE CHEST 1 VIEW COMPARISON:  Portable chest x-ray of May 13, 2016. FINDINGS: The lungs are well-expanded. Persistent confluent alveolar opacities are present in the left upper hemithorax. There is a stable small left pleural effusion. There is stable linear density at the left lung base. The heart is top-normal in size. The pulmonary vascularity is not engorged. The trachea is midline. The bony thorax exhibits no acute abnormality. IMPRESSION: Bibasilar atelectasis with small right pleural effusion. Stable alveolar opacity in the right upper lobe.  Electronically Signed   By: David  Martinique M.D.   On: 05/14/2016 07:43        Scheduled Meds: . atorvastatin  20 mg Oral q1800  . cefTAZidime (FORTAZ)  IV  1 g Intravenous Q8H  . chlorhexidine gluconate (SAGE KIT)  15 mL Mouth Rinse BID  . digoxin  0.125 mg Oral Daily  . diltiazem  60 mg  Oral Q12H  . enoxaparin (LOVENOX) injection  75 mg Subcutaneous Q12H  . insulin aspart  0-5 Units Subcutaneous QHS  . insulin aspart  0-9 Units Subcutaneous TID WC  . insulin glargine  20 Units Subcutaneous Daily  . ipratropium-albuterol  3 mL Nebulization Q4H  . methylPREDNISolone (SOLU-MEDROL) injection  80 mg Intravenous Q12H  . pantoprazole  40 mg Oral Daily  . warfarin  5 mg Oral ONCE-1800  . Warfarin - Pharmacist Dosing Inpatient   Does not apply q1800   Continuous Infusions: . sodium chloride 20 mL/hr at 05/14/16 0300     LOS: 4 days    Time spent: 40 minutes.    [facility], MD [facility] Pager [phone]  If 7PM-7AM, please contact night-coverage [email or web address] Password [facility] 05/15/2016, 12:49 PM

## 2016-05-15 NOTE — Progress Notes (Signed)
ANTICOAGULATION CONSULT NOTE - Follow Up Consult  Pharmacy Consult for Lovenox Indication: atrial fibrillation  No Known Allergies  Patient Measurements: Height: '5\' 6"'$  (167.6 cm) Weight: 152 lb 9.6 oz (69.219 kg) IBW/kg (Calculated) : 63.8  Vital Signs: Temp: 98.1 F (36.7 C) (06/03 0815) Temp Source: Oral (06/03 0815) BP: 135/83 mmHg (06/03 0815) Pulse Rate: 95 (06/03 0815)  Labs:  Recent Labs  05/13/16 0330 05/14/16 0522 05/15/16 0348  HGB 11.1*  --  11.5*  HCT 36.4*  --  38.3*  PLT 191  --  181  LABPROT 19.4* 20.2* 22.3*  INR 1.64* 1.72* 1.96*  CREATININE 1.03 0.84 0.80    Estimated Creatinine Clearance: 70.9 mL/min (by C-G formula based on Cr of 0.8).   Medications:  Prior to Admit - Coumadin 7.'5mg'$  on Sundays, '5mg'$  all other days  Assessment: 75 year old male admitted with COPD exacerbation on chronic warfarin for atrial fibrillation PTA. Pharmacy consulted to dose warfarin.  INR 1.72 > 1.96 after restart warfarin yesterday '5mg'$  x1  LFTs wnl, Hgb 11, plt wnl.   Currently on enox '1mg'$ /kg = 75 mg BID. No noted bleeding.  Goal of Therapy:  Anti-Xa level 0.6-1 units/ml 4hrs after LMWH dose given Monitor platelets by anticoagulation protocol: Yes   Plan:  Enoxaparin 1 mg/kg (75 mg)  SQ q12h  Warfarin 5 mg x 1 tonight Daily INR CBC at least q72h Monitor for signs and symptoms of bleeding  Bonnita Nasuti Pharm.D. CPP, BCPS Clinical Pharmacist [phone] 05/15/2016 8:54 AM

## 2016-05-16 DIAGNOSIS — R627 Adult failure to thrive: Secondary | ICD-10-CM

## 2016-05-16 LAB — GLUCOSE, CAPILLARY
GLUCOSE-CAPILLARY: 133 mg/dL — AB (ref 65–99)
Glucose-Capillary: 176 mg/dL — ABNORMAL HIGH (ref 65–99)
Glucose-Capillary: 114 mg/dL — ABNORMAL HIGH (ref 65–99)

## 2016-05-16 LAB — PROTIME-INR
INR: 1.79 — AB (ref 0.00–1.49)
Prothrombin Time: 20.8 seconds — ABNORMAL HIGH (ref 11.6–15.2)

## 2016-05-16 LAB — CULTURE, BLOOD (ROUTINE X 2)
Culture: NO GROWTH
Culture: NO GROWTH

## 2016-05-16 LAB — BASIC METABOLIC PANEL
Anion gap: 7 (ref 5–15)
BUN: 18 mg/dL (ref 6–20)
CO2: 45 mmol/L — ABNORMAL HIGH (ref 22–32)
CREATININE: 0.69 mg/dL (ref 0.61–1.24)
Calcium: 8.9 mg/dL (ref 8.9–10.3)
Chloride: 90 mmol/L — ABNORMAL LOW (ref 101–111)
GFR calc Af Amer: >60 mL/min (ref >60)
Glucose, Bld: 129 mg/dL — ABNORMAL HIGH (ref 65–99)
Potassium: 3.6 mmol/L (ref 3.5–5.1)
SODIUM: 142 mmol/L (ref 135–145)

## 2016-05-16 LAB — CBC
HCT: 40.2 % (ref 39.0–52.0)
Hemoglobin: 12.1 g/dL — ABNORMAL LOW (ref 13.0–17.0)
MCH: 28.5 pg (ref 26.0–34.0)
MCHC: 30.1 g/dL (ref 30.0–36.0)
MCV: 94.8 fL (ref 78.0–100.0)
PLATELETS: 153 10*3/uL (ref 150–400)
RBC: 4.24 MIL/uL (ref 4.22–5.81)
RDW: 13.7 % (ref 11.5–15.5)
WBC: 6.6 10*3/uL (ref 4.0–10.5)

## 2016-05-16 MED ORDER — WARFARIN SODIUM 7.5 MG PO TABS
7.5000 mg | ORAL_TABLET | Freq: Once | ORAL | Status: AC
  Administered 2016-05-16: 7.5 mg via ORAL
  Filled 2016-05-16: qty 1

## 2016-05-16 NOTE — Progress Notes (Signed)
PULMONARY / CRITICAL CARE MEDICINE   Name: Brent Wade MRN: [id number] DOB: 01-Sep-1941    ADMISSION DATE:  05/10/2016  REFERRING MD:  Dr. Rogene Houston  CHIEF COMPLAINT:  Short of breath  HISTORY OF PRESENT ILLNESS:   Hx from chart.  75 yo male brought to ER with progressive dyspnea.  He was also hypoxic.  He took pain medication earlier in the day.  He was found to be lethargic and had respiratory acidosis on ABG.  He was tried on BiPAP but continued to get worse.  He required intubation.  He has hx of GOLD D COPD, NSCLC s/p XRT, systolic CHF, PAF on coumadin.  He was in hospital recently for AECOPD.   SUBJECTIVE:  Im mild distress. On and off bipap  VITAL SIGNS: BP 160/92 mmHg  Pulse 106  Temp(Src) 98.2 F (36.8 C) (Oral)  Resp 26  Ht '5\' 6"'$  (1.676 m)  Wt 152 lb 12.5 oz (69.3 kg)  BMI 24.67 kg/m2  SpO2 97% 5 liters  HEMODYNAMICS:    VENTILATOR SETTINGS: Vent Mode:  [-]  FiO2 (%):  [40 %] 40 %  INTAKE / OUTPUT:  Intake/Output Summary (Last 24 hours) at 05/16/16 1558 Last data filed at 05/16/16 1220  Gross per 24 hour  Intake    460 ml  Output   1200 ml  Net   -740 ml     PHYSICAL EXAMINATION: General: Mild distress Neuro: Awake, alert, oriented X 3. No focal deficits HEENT:   MMM, no JVD Cardiovascular:  Irregular no MRG Lungs:  B/L wheeze Abdomen:  Soft, NT, ND Musculoskeletal:  Trace edema Skin:  No rashes  LABS:  BMET  Recent Labs Lab 05/14/16 0522 05/15/16 0348 05/16/16 0337  NA 140 142 142  K 4.0 4.5 3.6  CL 91* 88* 90*  CO2 41* 47* 45*  BUN '18 19 18  '$ CREATININE 0.84 0.80 0.69  GLUCOSE 247* 134* 129*    Electrolytes  Recent Labs Lab 05/11/16 1753 05/12/16 0458  05/14/16 0522 05/15/16 0348 05/16/16 0337  CALCIUM  --  8.6*  < > 8.8* 9.1 8.9  MG 2.0 2.0  --   --  2.1  --   PHOS 2.6 2.7  --   --  3.4  --   < > = values in this interval not displayed.  CBC  Recent Labs Lab 05/13/16 0330 05/15/16 0348 05/16/16 0337   WBC 8.7 7.3 6.6  HGB 11.1* 11.5* 12.1*  HCT 36.4* 38.3* 40.2  PLT 191 181 153    Coag's  Recent Labs Lab 05/14/16 0522 05/15/16 0348 05/16/16 0337  INR 1.72* 1.96* 1.79*    Sepsis Markers  Recent Labs Lab 05/11/16 0133 05/11/16 0427 05/12/16 0458 05/13/16 0330  LATICACIDVEN 2.0 2.1*  --   --   PROCALCITON <0.10  --  <0.10 <0.10    ABG  Recent Labs Lab 05/10/16 2358 05/11/16 0152 05/11/16 0448  PHART 7.176* 7.253* 7.345*  PCO2ART 97.7* 75.8* 57.6*  PO2ART 164.0* 107.0* 116.0*    Liver Enzymes  Recent Labs Lab 05/10/16 2255 05/11/16 0133  AST 19 23  ALT 20 20  ALKPHOS 58 55  BILITOT 0.4 0.6  ALBUMIN 3.8 3.5    Cardiac Enzymes No results for input(s): TROPONINI, PROBNP in the last 168 hours.  Glucose  Recent Labs Lab 05/15/16 0740 05/15/16 1121 05/15/16 1628 05/15/16 2052 05/16/16 0737 05/16/16 1128  GLUCAP 162* 253* 198* 212* 133* 176*    Imaging No results found.PCXR Aeration improved.  Still w/ right basilar atx/effusion. But improved.   STUDIES:  03/27/12 PFT >> FEV1 1.08 (40%), FEV1% 40, TLC 7.16 (119%), DLCO 67%, +BD  CULTURES: 5/30 Blood >> 5/30 Sputum: rarer GNR and rare GPR>>nl flora  ANTIBIOTICS: 5/30 Fortaz >> 5/30 Vancomycin >> 5/31  SIGNIFICANT EVENTS: 5/29 Admit 5/31 extubated; required NIPPV and extra diuresis that night.  6/1 getting lasix and cycling BIPAP. Still frail 6/2 looking better. BIPAP changed to PRN and HS. Cont lasix. ECHO still pending. Getting OOB  6/3 PCCM asked to reevaluate for resp distress. No distress on exam.  LINES/TUBES: 5/30 ETT >> 5/31  DISCUSSION: 75 yo male with AECOPD, VDRF, HCAP.  Hx of NSCLC s/p XRT, systolic CHF, PAF. Extubated 5/3. CXR slowly clearing. Clinically looks better w/ lasix. Required NIPPV for most of the day 6/1, but looks like we can de-escalate this. For today plan is: de-escalate NIPPV, cont steroids, BDs, ABX and lasix. He needs to get OOB. Will allow him to  increase diet. We will need to CT his chest at some point when he is stronger to f/u his cancer status. He can use Bipap for comfort.  Intake/Output Summary (Last 24 hours) at 05/16/16 1558 Last data filed at 05/16/16 1220  Gross per 24 hour  Intake    460 ml  Output   1200 ml  Net   -740 ml    ASSESSMENT / PLAN:  PULMONARY A: Acute on chronic hypoxic/hypercapnic respiratory failure 2nd to HCAP, AECOPD. Small right effusion -->extubated 5/31.  -->right basilar atx/effusion.  P:   Cont lasix for negative volume status as tolerated Continue using the Bipap PRN Scheduled BDs Cont flutter and IS Wean O2 Continue steroids mobilize  CARDIOVASCULAR A:  Hx of PAF, HTN, HLD, chronic systolic CHF (EF 35-57%). P:  Resume coumadin w/ LMWH bridge  Continue cardizem, digoxin, zocor Lasix for negative volume status   RENAL Lab Results  Component Value Date   CREATININE 0.69 05/16/2016   CREATININE 0.80 05/15/2016   CREATININE 0.84 05/14/2016   CREATININE 0.97 03/12/2014   CREATININE 1.1 11/26/2013    A:   Hx of Lt nephrectomy. pulm edema component P:   Monitor renal fx, urine outpt, electrolytes Lasix as BUN/creatinine allow   GASTROINTESTINAL A:   Nutrition. P:   Adv diet PPI for GERD component   HEMATOLOGIC A:   Hx of NSCLC (adenocarcinoma) s/p XRT. Coumadin at home ( fib) P:  F/u CBC  INFECTIOUS A:   R/o HCAP >> has progression of RUL ASD. P:   Continue Abx Assess rt base pcxr in am   ENDOCRINE A:   Hx of DM with steroid induced hyperglycemia.  P:   SSI Hold amaryl, glucophage  NEUROLOGIC A:   Acute metabolic encephalopathy-->resolved Physical deconditioning  P:   Hold outpt remeron, oxycodone PT consult  OOB  Overall he has poor prognosis as he has advanced COPD with a declining course. He may have recurrent lung cancer but has refused salvage chemotherapy.  I agree with Palliative care consult for consideration of hospice. I discussed  his goals of care with him today with family in room. He wants to be DNR/DNI.   Marshell Garfinkel MD [facility] Pager [phone] If no answer or after 3pm call: [phone] 05/16/2016, 3:58 PM

## 2016-05-16 NOTE — Progress Notes (Signed)
Patient was taken off BIPAP by RN and placed on 40% Venturi mask instead of nasal cannula because patient was mouth breathing per RN. SPO2 100%. No distress noted. Pt responding appropriately. RT will continue to monitor.

## 2016-05-16 NOTE — Progress Notes (Signed)
ANTICOAGULATION CONSULT NOTE - Follow Up Consult  Pharmacy Consult for Lovenox / Warfarin Indication: atrial fibrillation  No Known Allergies  Patient Measurements: Height: '5\' 6"'$  (167.6 cm) Weight: 152 lb 12.5 oz (69.3 kg) IBW/kg (Calculated) : 63.8  Vital Signs: Temp: 97.7 F (36.5 C) (06/04 0435) Temp Source: Oral (06/04 0435) BP: 124/70 mmHg (06/04 0435) Pulse Rate: 66 (06/04 0500)  Labs:  Recent Labs  05/14/16 0522 05/15/16 0348 05/16/16 0337  HGB  --  11.5* 12.1*  HCT  --  38.3* 40.2  PLT  --  181 153  LABPROT 20.2* 22.3* 20.8*  INR 1.72* 1.96* 1.79*  CREATININE 0.84 0.80 0.69    Estimated Creatinine Clearance: 70.9 mL/min (by C-G formula based on Cr of 0.69).   Medications:  Prior to Admit - Coumadin 7.'5mg'$  on Sundays, '5mg'$  all other days  Assessment: 75 year old male admitted with COPD exacerbation on chronic warfarin for atrial fibrillation PTA. Pharmacy consulted to dose warfarin.  INR 1.72 > 1.96 after warf '5mg'$  > but fell to 1.79 after repeat '5mg'$  will give boost today LFTs wnl, Hgb 11, plt wnl.   Currently on enox '1mg'$ /kg = 75 mg BID. No noted bleeding.  Goal of Therapy:  Anti-Xa level 0.6-1 units/ml 4hrs after LMWH dose given Monitor platelets by anticoagulation protocol: Yes   Plan:  Enoxaparin 1 mg/kg (75 mg)  SQ q12h  Warfarin 7.5 mg x 1 tonight Daily INR CBC at least q72h Monitor for signs and symptoms of bleeding  Bonnita Nasuti Pharm.D. CPP, BCPS Clinical Pharmacist [phone] 05/16/2016 8:04 AM

## 2016-05-16 NOTE — Progress Notes (Addendum)
PROGRESS NOTE  Brent Wade  MRN:[id number] DOB: 06/17/1941  DOA: 05/10/2016 PCP: Brent Wade   Brief Narrative:  75-year-old male with a PMH of GOLD D COPD, NSCLC s/p XRT, systolic CHF, PAF on coumadin, recently hospitalized for AECOPD, presented to ED with progressive dyspnea and hypoxia. He took pain medication earlier that day. He was found to be lethargic and had respiratory acidosis on ABG. He was tried on BiPAP but continued to get worse. He required intubation and ICU admission by CCM for AECOPD, VDRF. Extubated 5/31. Felt to have improved. Transferred to floor and TRH on 6/3. Overnight 6/2, issues with dyspnea requiring BiPAP. Requested CCM to continue to follow on 6/3.   Assessment & Plan:   Active Problems:   Acute respiratory failure (HCC)   Acidemia   Acute on chronic respiratory failure with hypoxia and hypercapnia (HCC)   Delirium   Pneumonia   Acute on chronic hypoxic and hypercapnic respiratory failure d/t AECOPD & HCAP - Required intubation and management by CCM in ICU. Extubated 5/31. - Continue antibiotics (IV Fortaz), IV Solu-Medrol, oxygen, when necessary BiPAP, bronchodilators, flutter valve and incentive spirometry.  - CCM follow-up 6/3 appreciated: Advanced COPD, multiple recent exacerbations, continued increased work of breathing requiring BiPAP, recommend goals of care discussion >requested RN to coordinate with patient, family and CCM regarding meeting for same. Patient considering "I don't want the tube". Palliative care also consulted for GOC - Blood cultures negative to date. MRSA screen negative - Metabolic alkalosis/bicarbonate 47-multifactorial related to secondary respiratory compensation and diuresis.? Trial of a dose of acetazolamide. - Repeat chest x-ray 6/3 shows no change in RUL opacity and mild pleural effusions right >left.  Paroxysmal A. Fib - Controlled ventricular rate on monitor. INR almost therapeutic/1.94. On Lovenox bridge  until INR therapeutic >2. INR: 1.7 - Continue diltiazem, digoxin. - When necessary IV Lasix  Essential hypertension - Controlled  Chronic systolic CHF - No peripheral edema. Does not seem overtly volume overloaded. Repeat chest x-ray does not indicate pulmonary edema. - 2-D echo: Normal EF. Results as below.  Hyperlipidemia - Continue statins  History of left nephrectomy - Creatinine normal. Monitor BMP periodically.  GERD - PPI.  NSCLC (adenocarcinoma) s/p XRT. - Follows with Brent Wade, radiation oncologist  Type II DM/steroid-induced hyperglycemia - Hold Amaryl and Glucophage. - On Lantus and SSI. Adjust insulin's-added mealtime NovoLog. CBGs better/fluctuating. Check A1c-pending  Acute metabolic encephalopathy - Resolved  Physical deconditioning - PT recommending home health with 24-hour supervision/assistance-if does not have then will need SNF.  Anemia - Stable.  Adult failure to thrive - Multifactorial. Palliative consulted for goals of care.    DVT prophylaxis: Lovenox bridging and Coumadin Code Status: Full Family Communication: Discussed with patient. No family at bedside. Left VM message for Brent Wade, patient's son. Disposition Plan: DC home versus SNF when medically stable.   Consultants:   CCM  Procedures:   Extubated 5/31  2-D echo 05/14/69: Study Conclusions  - Left ventricle: The cavity size was normal. Systolic function was  normal. The estimated ejection fraction was in the range of 55%  to 60%. Wall motion was normal; there were no regional wall  motion abnormalities. - Aortic valve: Moderate diffuse thickening and calcification,  consistent with sclerosis. The left and noncoronary cusps are  heavily calcified with reduced leaflet excursion. - Tricuspid valve: There was trivial regurgitation.  Antimicrobials:   IV Fortaz 5/29 >consider discontinuing after 6/4 dose  IV vancomycin 5/29 > 5/30      Subjective: DOA.  Currently on oxygen via Ventimask.  Objective:  Filed Vitals:   05/16/16 0616 05/16/16 0805 05/16/16 1132 05/16/16 1200  BP:  175/89  160/92  Pulse:  91  106  Temp:  98.4 F (36.9 C)  98.2 F (36.8 C)  TempSrc:  Oral  Oral  Resp:  28  26  Height:      Weight: 69.3 kg (152 lb 12.5 oz)     SpO2:  95% 96% 96%    Intake/Output Summary (Last 24 hours) at 05/16/16 1238 Last data filed at 05/16/16 1220  Gross per 24 hour  Intake    560 ml  Output   1475 ml  Net   -915 ml   Filed Weights   05/14/16 1409 05/15/16 0500 05/16/16 0616  Weight: 69.219 kg (152 lb 9.6 oz) 69.219 kg (152 lb 9.6 oz) 69.3 kg (152 lb 12.5 oz)    Examination:  General exam: Pleasant elderly male, moderately built, extremely frail, chronically ill-looking lying propped up in bed with no obvious distress this morning. Respiratory system: Reduced breath sounds bilaterally and occasional basal crackles. No obvious rhonchi or wheezing. No increased work of breathing. Cardiovascular system: S1 & S2 heard, irregularly irregular. No JVD, murmurs, rubs, gallops or clicks. No pedal edema. Telemetry: A. fib with controlled ventricular rate. Gastrointestinal system: Abdomen is nondistended, soft and nontender. No organomegaly or masses felt. Normal bowel sounds heard. Central nervous system: Alert and oriented. No focal neurological deficits. Extremities: Symmetric 5 x 5 power. Skin: No rashes, lesions or ulcers Psychiatry: Judgement and insight appear normal. Mood & affect appropriate.     Data Reviewed: I have personally reviewed following labs and imaging studies  CBC:  Recent Labs Lab 05/10/16 2255 05/11/16 0422 05/13/16 0330 05/15/16 0348 05/16/16 0337  WBC 7.1 6.3 8.7 7.3 6.6  NEUTROABS 5.3  --   --   --   --   HGB 12.7* 11.2* 11.1* 11.5* 12.1*  HCT 42.1 37.0* 36.4* 38.3* 40.2  MCV 97.2 95.6 95.8 95.8 94.8  PLT 238 186 191 181 153   Basic Metabolic Panel:  Recent Labs Lab 05/11/16 0133  05/11/16 0422 05/11/16 1753 05/12/16 0458 05/12/16 1618 05/13/16 0330 05/14/16 0522 05/15/16 0348 05/16/16 0337  NA 138 140  --  138 139 141 140 142 142  K 4.8 4.5  --  3.8 3.7 4.0 4.0 4.5 3.6  CL 97* 102  --  99* 98* 99* 91* 88* 90*  CO2 34* 30  --  32 32 36* 41* 47* 45*  GLUCOSE 209* 201*  --  239* 373* 267* 247* 134* 129*  BUN 12 12  --  18 18 19 18 19 18  CREATININE 1.00 0.89  --  0.89 0.91 1.03 0.84 0.80 0.69  CALCIUM 8.7* 8.4*  --  8.6* 8.4* 8.8* 8.8* 9.1 8.9  MG 1.9 1.8 2.0 2.0  --   --   --  2.1  --   PHOS 4.0 3.8 2.6 2.7  --   --   --  3.4  --    GFR: Estimated Creatinine Clearance: 70.9 mL/min (by C-G formula based on Cr of 0.69). Liver Function Tests:  Recent Labs Lab 05/10/16 2255 05/11/16 0133  AST 19 23  ALT 20 20  ALKPHOS 58 55  BILITOT 0.4 0.6  PROT 6.2* 5.9*  ALBUMIN 3.8 3.5   No results for input(s): LIPASE, AMYLASE in the last 168 hours. No results for input(s): AMMONIA in the last 168   hours. Coagulation Profile:  Recent Labs Lab 05/12/16 0458 05/13/16 0330 05/14/16 0522 05/15/16 0348 05/16/16 0337  INR 2.03* 1.64* 1.72* 1.96* 1.79*   Cardiac Enzymes: No results for input(s): CKTOTAL, CKMB, CKMBINDEX, TROPONINI in the last 168 hours. BNP (last 3 results) No results for input(s): PROBNP in the last 8760 hours. HbA1C: No results for input(s): HGBA1C in the last 72 hours. CBG:  Recent Labs Lab 05/15/16 1121 05/15/16 1628 05/15/16 2052 05/16/16 0737 05/16/16 1128  GLUCAP 253* 198* 212* 133* 176*   Lipid Profile: No results for input(s): CHOL, HDL, LDLCALC, TRIG, CHOLHDL, LDLDIRECT in the last 72 hours. Thyroid Function Tests: No results for input(s): TSH, T4TOTAL, FREET4, T3FREE, THYROIDAB in the last 72 hours. Anemia Panel: No results for input(s): VITAMINB12, FOLATE, FERRITIN, TIBC, IRON, RETICCTPCT in the last 72 hours.  Sepsis Labs:  Recent Labs Lab 05/11/16 0133 05/11/16 0427 05/12/16 0458 05/13/16 0330  PROCALCITON  <0.10  --  <0.10 <0.10  LATICACIDVEN 2.0 2.1*  --   --     Recent Results (from the past 240 hour(s))  Culture, blood (Routine X 2) w Reflex to ID Panel     Status: None (Preliminary result)   Collection Time: 05/11/16  1:30 AM  Result Value Ref Range Status   Specimen Description BLOOD RIGHT FOREARM  Final   Special Requests IN PEDIATRIC BOTTLE 1ML  Final   Culture NO GROWTH 4 DAYS  Final   Report Status PENDING  Incomplete  Culture, blood (Routine X 2) w Reflex to ID Panel     Status: None (Preliminary result)   Collection Time: 05/11/16  1:55 AM  Result Value Ref Range Status   Specimen Description BLOOD LEFT ARM  Final   Special Requests IN PEDIATRIC BOTTLE 4ML  Final   Culture NO GROWTH 4 DAYS  Final   Report Status PENDING  Incomplete  MRSA PCR Screening     Status: None   Collection Time: 05/11/16  4:02 AM  Result Value Ref Range Status   MRSA by PCR NEGATIVE NEGATIVE Final    Comment:        The GeneXpert MRSA Assay (FDA approved for NASAL specimens only), is one component of a comprehensive MRSA colonization surveillance program. It is not intended to diagnose MRSA infection nor to guide or monitor treatment for MRSA infections.   Culture, respiratory (NON-Expectorated)     Status: None   Collection Time: 05/11/16  4:54 AM  Result Value Ref Range Status   Specimen Description ENDOTRACHEAL  Final   Special Requests NONE  Final   Gram Stain   Final    FEW WBC PRESENT, PREDOMINANTLY PMN FEW SQUAMOUS EPITHELIAL CELLS PRESENT ABUNDANT GRAM POSITIVE COCCI IN PAIRS FEW GRAM NEGATIVE RODS RARE YEAST RARE GRAM POSITIVE RODS    Culture Consistent with normal respiratory flora.  Final   Report Status 05/14/2016 FINAL  Final         Radiology Studies: Dg Chest Port 1 View  05/15/2016  CLINICAL DATA:  Pneumonia followup EXAM: PORTABLE CHEST 1 VIEW COMPARISON:  05/14/16 FINDINGS: Normal heart size. There is persistent opacity within the right upper lobe which appears  unchanged from previous exam. Bilateral pleural effusions are identified right greater in left. This is unchanged from previous exam. IMPRESSION: 1. No change in right upper lobe opacity. 2. Persistent pleural effusions, right greater in left. Electronically Signed   By: Kerby Moors M.D.   On: 05/15/2016 13:39        Scheduled  Meds: . atorvastatin  20 mg Oral q1800  . cefTAZidime (FORTAZ)  IV  1 g Intravenous Q8H  . chlorhexidine gluconate (SAGE KIT)  15 mL Mouth Rinse BID  . digoxin  0.125 mg Oral Daily  . diltiazem  60 mg Oral Q12H  . enoxaparin (LOVENOX) injection  75 mg Subcutaneous Q12H  . insulin aspart  0-5 Units Subcutaneous QHS  . insulin aspart  0-9 Units Subcutaneous TID WC  . insulin aspart  3 Units Subcutaneous TID WC  . insulin glargine  20 Units Subcutaneous Daily  . ipratropium-albuterol  3 mL Nebulization Q4H  . methylPREDNISolone (SOLU-MEDROL) injection  80 mg Intravenous Q12H  . pantoprazole  40 mg Oral Daily  . warfarin  7.5 mg Oral ONCE-1800  . Warfarin - Pharmacist Dosing Inpatient   Does not apply q1800   Continuous Infusions: . sodium chloride 20 mL/hr at 05/14/16 0300     LOS: 5 days    Time spent: 40 minutes.    ,, Wade [facility] Pager [phone]  If 7PM-7AM, please contact night-coverage [email or web address] Password [email or web address] 05/16/2016, 12:38 PM    

## 2016-05-17 DIAGNOSIS — K5901 Slow transit constipation: Secondary | ICD-10-CM

## 2016-05-17 DIAGNOSIS — Z7189 Other specified counseling: Secondary | ICD-10-CM

## 2016-05-17 DIAGNOSIS — Z515 Encounter for palliative care: Secondary | ICD-10-CM

## 2016-05-17 DIAGNOSIS — R0902 Hypoxemia: Secondary | ICD-10-CM

## 2016-05-17 LAB — PROTIME-INR
INR: 1.73 — AB (ref 0.00–1.49)
Prothrombin Time: 20.2 seconds — ABNORMAL HIGH (ref 11.6–15.2)

## 2016-05-17 LAB — BASIC METABOLIC PANEL
Anion gap: 6 (ref 5–15)
BUN: 18 mg/dL (ref 6–20)
CHLORIDE: 89 mmol/L — AB (ref 101–111)
CO2: 46 mmol/L — ABNORMAL HIGH (ref 22–32)
CREATININE: 0.68 mg/dL (ref 0.61–1.24)
Calcium: 8.9 mg/dL (ref 8.9–10.3)
Glucose, Bld: 115 mg/dL — ABNORMAL HIGH (ref 65–99)
Potassium: 3.5 mmol/L (ref 3.5–5.1)
SODIUM: 141 mmol/L (ref 135–145)

## 2016-05-17 LAB — GLUCOSE, CAPILLARY
GLUCOSE-CAPILLARY: 223 mg/dL — AB (ref 65–99)
GLUCOSE-CAPILLARY: 95 mg/dL (ref 65–99)

## 2016-05-17 LAB — HEMOGLOBIN A1C
Hgb A1c MFr Bld: 8.7 % — ABNORMAL HIGH (ref 4.8–5.6)
Mean Plasma Glucose: 203 mg/dL

## 2016-05-17 MED ORDER — GLYCOPYRROLATE 1 MG PO TABS: 1.0000 mg | ORAL_TABLET | ORAL | Status: DC | PRN

## 2016-05-17 MED ORDER — ONDANSETRON HCL 4 MG/2ML IJ SOLN: 4.0000 mg | Freq: Four times a day (QID) | INTRAMUSCULAR | Status: DC | PRN

## 2016-05-17 MED ORDER — DEXTROSE 5 % IV SOLN: 1.0000 mg/h | INTRAVENOUS | Status: AC

## 2016-05-17 MED ORDER — HALOPERIDOL LACTATE 2 MG/ML PO CONC: 0.5000 mg | ORAL | Status: AC | PRN

## 2016-05-17 MED ORDER — GLYCOPYRROLATE 0.2 MG/ML IJ SOLN: 0.2000 mg | INTRAMUSCULAR | Status: DC | PRN

## 2016-05-17 MED ORDER — MAGNESIUM HYDROXIDE 400 MG/5ML PO SUSP
30.0000 mL | Freq: Once | ORAL | Status: AC
  Administered 2016-05-17: 30 mL via ORAL
  Filled 2016-05-17: qty 30

## 2016-05-17 MED ORDER — WARFARIN SODIUM 7.5 MG PO TABS: 7.5000 mg | ORAL_TABLET | Freq: Once | ORAL | Status: DC

## 2016-05-17 MED ORDER — LORAZEPAM 1 MG PO TABS: 1.0000 mg | ORAL_TABLET | ORAL | Status: AC | PRN

## 2016-05-17 MED ORDER — MORPHINE SULFATE (PF) 2 MG/ML IV SOLN
1.0000 mg | INTRAVENOUS | Status: DC | PRN
  Administered 2016-05-17: 1 mg via INTRAVENOUS
  Filled 2016-05-17: qty 1

## 2016-05-17 MED ORDER — ACETAMINOPHEN 650 MG RE SUPP: 650.0000 mg | Freq: Four times a day (QID) | RECTAL | Status: DC | PRN

## 2016-05-17 MED ORDER — MORPHINE SULFATE (PF) 2 MG/ML IV SOLN
1.0000 mg | INTRAVENOUS | Status: DC | PRN
  Administered 2016-05-17: 2 mg via INTRAVENOUS
  Filled 2016-05-17: qty 1

## 2016-05-17 MED ORDER — MORPHINE SULFATE (PF) 2 MG/ML IV SOLN
1.0000 mg | INTRAVENOUS | Status: DC | PRN
  Administered 2016-05-17 (×4): 2 mg via INTRAVENOUS
  Filled 2016-05-17 (×4): qty 1

## 2016-05-17 MED ORDER — MORPHINE BOLUS VIA INFUSION
2.0000 mg | INTRAVENOUS | Status: DC | PRN
  Filled 2016-05-17: qty 2

## 2016-05-17 MED ORDER — ONDANSETRON 4 MG PO TBDP
4.0000 mg | ORAL_TABLET | Freq: Four times a day (QID) | ORAL | Status: DC | PRN
  Filled 2016-05-17: qty 1

## 2016-05-17 MED ORDER — HALOPERIDOL LACTATE 2 MG/ML PO CONC
0.5000 mg | ORAL | Status: DC | PRN
  Administered 2016-05-17: 0.5 mg via SUBLINGUAL
  Filled 2016-05-17 (×2): qty 0.3

## 2016-05-17 MED ORDER — GLYCOPYRROLATE 0.2 MG/ML IJ SOLN
0.2000 mg | INTRAMUSCULAR | Status: DC | PRN
  Filled 2016-05-17: qty 1

## 2016-05-17 MED ORDER — ACETAMINOPHEN 325 MG PO TABS: 650.0000 mg | ORAL_TABLET | Freq: Four times a day (QID) | ORAL | Status: DC | PRN

## 2016-05-17 MED ORDER — MORPHINE SULFATE 25 MG/ML IV SOLN
1.0000 mg/h | INTRAVENOUS | Status: DC
  Filled 2016-05-17: qty 10

## 2016-05-17 MED ORDER — HALOPERIDOL 0.5 MG PO TABS: 0.5000 mg | ORAL_TABLET | ORAL | Status: DC | PRN

## 2016-05-17 MED ORDER — ALBUTEROL SULFATE (2.5 MG/3ML) 0.083% IN NEBU: 2.5000 mg | INHALATION_SOLUTION | RESPIRATORY_TRACT | Status: AC | PRN

## 2016-05-17 MED ORDER — HALOPERIDOL LACTATE 5 MG/ML IJ SOLN: 0.5000 mg | INTRAMUSCULAR | Status: DC | PRN

## 2016-05-17 MED ORDER — GLYCOPYRROLATE 0.2 MG/ML IJ SOLN: 0.2000 mg | INTRAMUSCULAR | Status: AC | PRN

## 2016-05-17 MED ORDER — LORAZEPAM 1 MG PO TABS
1.0000 mg | ORAL_TABLET | ORAL | Status: DC | PRN
  Administered 2016-05-17 (×2): 1 mg via ORAL
  Filled 2016-05-17 (×3): qty 1

## 2016-05-17 MED ORDER — IPRATROPIUM-ALBUTEROL 0.5-2.5 (3) MG/3ML IN SOLN: 3.0000 mL | RESPIRATORY_TRACT | Status: AC

## 2016-05-17 NOTE — Discharge Summary (Signed)
Physician Discharge Summary  Brent Wade  QPY:[id number]  DOB: [birth year]  DOA: 05/10/2016  PCP: Simona Huh, MD  Admit date: 05/10/2016 Discharge date: 05/17/2016  Time spent: Greater than 30 minutes  Recommendations for Outpatient Follow-up:  1. Patient is discharging to residential hospice for end-of-life care.  Discharge Diagnoses:  Active Problems:   Acute respiratory failure (HCC)   Acidemia   Acute on chronic respiratory failure with hypoxia and hypercapnia (HCC)   Delirium   Pneumonia   Palliative care encounter   Slow transit constipation   Goals of care, counseling/discussion   Encounter for hospice care discussion   Hypoxemia   Discharge Condition: Guarded, rapidly declining.  Diet recommendation: Regular diet/comfort feeds.  Filed Weights   05/15/16 0500 05/16/16 0616 05/17/16 0602  Weight: 69.219 kg (152 lb 9.6 oz) 69.3 kg (152 lb 12.5 oz) 68.3 kg (150 lb 9.2 oz)    History of present illness:  75 year old male with a PMH of GOLD D COPD, NSCLC s/p XRT, systolic CHF, PAF on coumadin, recently hospitalized for AECOPD, presented to ED with progressive dyspnea and hypoxia. He took pain medication earlier that day. He was found to be lethargic and had respiratory acidosis on ABG. He was tried on BiPAP but continued to get worse. He required intubation and ICU admission by CCM for AECOPD, VDRF. Extubated 5/31. Felt to have improved. Transferred to floor and TRH on 6/3. Overnight 6/2, issues with dyspnea requiring BiPAP. CCM continue to follow. Despite all aggressive measures, patient did not improve and continued to decline with worsening/persistent respiratory distress. Palliative care was consulted and met with patient and family. Patient opted transitioning to full comfort care and will be discharged to residential hospice.  Hospital Course:   Acute on chronic hypoxic and hypercapnic respiratory failure d/t AECOPD & HCAP - Required intubation and management  by CCM in ICU. Extubated 5/31. - Treated with antibiotics (IV Tressie Ellis), IV Solu-Medrol, oxygen, when necessary BiPAP, bronchodilators, flutter valve and incentive spirometry.  - CCM follow-up 6/3 appreciated: Advanced COPD, multiple recent exacerbations, continued increased work of breathing requiring BiPAP, recommend goals of care discussion >requested RN to coordinate with patient, family and CCM regarding meeting for same. Patient considering "I don't want the tube". Palliative care also consulted for GOC - Blood cultures negative to date. MRSA screen negative - Metabolic alkalosis/bicarbonate 47-multifactorial related to secondary respiratory compensation and diuresis.? Trial of a dose of acetazolamide. - Repeat chest x-ray 6/3 shows no change in RUL opacity and mild pleural effusions right >left. - Persistent dyspnea despite all aggressive measures. CCM input appreciated. DO NOT RESUSCITATE/DO NOT INTUBATE established. Palliative care consulted. Patient and family opting for residential hospice. Poor overall prognosis. - Titrating morphine for dyspnea and Ativan for anxiety. - Palliative care consulted and finally transitioned to full comfort care this afternoon including morphine drip, discontinuation of all medications nonessential to comfort and patient will be discharged to residential hospice for EOL care.  Paroxysmal A. Fib - Controlled ventricular rate on monitor. INR almost therapeutic/1.94. On Lovenox bridge until INR therapeutic >2. INR: 1.7 - All medications nonessential to comfort were discontinued.  Essential hypertension - Controlled  Chronic systolic CHF - No peripheral edema. Does not seem overtly volume overloaded. Repeat chest x-ray does not indicate pulmonary edema. - 2-D echo: Normal EF. Results as below.  Hyperlipidemia  History of left nephrectomy - Creatinine normal.   GERD - PPI.  NSCLC (adenocarcinoma) s/p XRT. - Follows with Dr. Pablo Ledger, radiation  oncologist. As per  pulmonologist, patient may have recurrent lung cancer but has refused salvage chemotherapy.  Type II DM/steroid-induced hyperglycemia - Discontinued oral hypoglycemics since patient at this time is full comfort care.  Acute metabolic encephalopathy - Resolved  Physical deconditioning - Multifactorial. Now transitioning to residential hospice.  Anemia - Stable.  Adult failure to thrive - Multifactorial. Transitioning to full comfort care at residential hospice.  DO NOT RESUSCITATE   Consultants:   CCM  Procedures:   Extubated 5/31  2-D echo 05/14/69: Study Conclusions  - Left ventricle: The cavity size was normal. Systolic function was  normal. The estimated ejection fraction was in the range of 55%  to 60%. Wall motion was normal; there were no regional wall  motion abnormalities. - Aortic valve: Moderate diffuse thickening and calcification,  consistent with sclerosis. The left and noncoronary cusps are  heavily calcified with reduced leaflet excursion. - Tricuspid valve: There was trivial regurgitation.   Discharge Exam:  Complaints: Difficulty breathing even at rest despite all measures.   Filed Vitals:   05/17/16 1223 05/17/16 1224 05/17/16 1225 05/17/16 1226  BP:      Pulse: 89 96 103 97  Temp:      TempSrc:      Resp: _0 Height:      Weight:      SpO2: 96% 97% 97% 96%    General exam: Pleasant elderly male, moderately built, extremely frail, chronically ill-looking lying propped up in bed with ongoing mild respiratory distress and wheezing. Respiratory system: Reduced breath sounds bilaterally with scattered rhonchi. Mild increased work of breathing. Cardiovascular system: S1 & S2 heard, irregularly irregular. No JVD, murmurs, rubs, gallops or clicks. No pedal edema. Telemetry: A. fib with controlled ventricular rate. Gastrointestinal system: Abdomen is nondistended, soft and nontender. No organomegaly or masses felt.  Normal bowel sounds heard. Central nervous system: Alert and oriented. No focal neurological deficits. Extremities: Symmetric 5 x 5 power. Skin: No rashes, lesions or ulcers Psychiatry: Judgement and insight appear normal. Mood & affect appropriate.   Discharge Instructions      Discharge Instructions    Call MD for:  difficulty breathing, headache or visual disturbances    Complete by:  As directed      Call MD for:  persistant nausea and vomiting    Complete by:  As directed      Call MD for:  severe uncontrolled pain    Complete by:  As directed      Call MD for:  temperature >100.4    Complete by:  As directed      Diet general    Complete by:  As directed      Discharge instructions    Complete by:  As directed   Oxygen via nasal cannula or mask, titrate to comfort.     Increase activity slowly    Complete by:  As directed             Medication List    STOP taking these medications        DIGOX 0.125 MG tablet  Generic drug:  digoxin     diltiazem 60 MG tablet  Commonly known as:  CARDIZEM     doxazosin 2 MG tablet  Commonly known as:  CARDURA     glimepiride 2 MG tablet  Commonly known as:  AMARYL     metFORMIN 500 MG 24 hr tablet  Commonly known as:  GLUCOPHAGE-XR     mirtazapine 30 MG tablet  Commonly known as:  REMERON     oxyCODONE 5 MG immediate release tablet  Commonly known as:  Oxy IR/ROXICODONE     simvastatin 40 MG tablet  Commonly known as:  ZOCOR     TUDORZA PRESSAIR 400 MCG/ACT Aepb  Generic drug:  Aclidinium Bromide     warfarin 5 MG tablet  Commonly known as:  COUMADIN      TAKE these medications        albuterol (2.5 MG/3ML) 0.083% nebulizer solution  Commonly known as:  PROVENTIL  Take 3 mLs (2.5 mg total) by nebulization every 2 (two) hours as needed for wheezing or shortness of breath.     budesonide-formoterol 160-4.5 MCG/ACT inhaler  Commonly known as:  SYMBICORT  Inhale 2 puffs into the lungs 2 (two) times daily.      glycopyrrolate 0.2 MG/ML injection  Commonly known as:  ROBINUL  Inject 1 mL (0.2 mg total) into the vein every 4 (four) hours as needed (excessive secretions).     haloperidol 2 MG/ML solution  Commonly known as:  HALDOL  Place 0.3 mLs (0.6 mg total) under the tongue every 4 (four) hours as needed for agitation (or delirium).     ipratropium-albuterol 0.5-2.5 (3) MG/3ML Soln  Commonly known as:  DUONEB  Take 3 mLs by nebulization every 4 (four) hours.     LORazepam 1 MG tablet  Commonly known as:  ATIVAN  Take 1 tablet (1 mg total) by mouth every 4 (four) hours as needed for anxiety.     morphine 250 mg in dextrose 5 % 250 mL  Inject 1 mg/hr into the vein continuous. And may use 1 mg IV every hour when necessary for uncontrolled pain, distress or if respiratory rate is greater than 25     SYSTANE BALANCE OP  Apply 1-2 drops to eye daily as needed (for dryness).         Get Medicines reviewed and adjusted: Please take all your medications with you for your next visit with your Primary MD  Please request your Primary MD to go over all hospital tests and procedure/radiological results at the follow up. Please ask your Primary MD to get all Hospital records sent to his/her office.  If you experience worsening of your admission symptoms, develop shortness of breath, life threatening emergency, suicidal or homicidal thoughts you must seek medical attention immediately by calling 911 or calling your MD immediately if symptoms less severe.  You must read complete instructions/literature along with all the possible adverse reactions/side effects for all the Medicines you take and that have been prescribed to you. Take any new Medicines after you have completely understood and accept all the possible adverse reactions/side effects.   Do not drive when taking pain medications.   Do not take more than prescribed Pain, Sleep and Anxiety Medications  Special Instructions: If you have  smoked or chewed Tobacco in the last 2 yrs please stop smoking, stop any regular Alcohol and or any Recreational drug use.  Wear Seat belts while driving.  Please note  You were cared for by a hospitalist during your hospital stay. Once you are discharged, your primary care physician will handle any further medical issues. Please note that NO REFILLS for any discharge medications will be authorized once you are discharged, as it is imperative that you return to your primary care physician (or establish a relationship with a primary care physician if you do not have one) for your aftercare needs so that  they can reassess your need for medications and monitor your lab values.    The results of significant diagnostics from this hospitalization (including imaging, microbiology, ancillary and laboratory) are listed below for reference.    Significant Diagnostic Studies: Dg Chest Port 1 View  05/15/2016  CLINICAL DATA:  Pneumonia followup EXAM: PORTABLE CHEST 1 VIEW COMPARISON:  05/14/16 FINDINGS: Normal heart size. There is persistent opacity within the right upper lobe which appears unchanged from previous exam. Bilateral pleural effusions are identified right greater in left. This is unchanged from previous exam. IMPRESSION: 1. No change in right upper lobe opacity. 2. Persistent pleural effusions, right greater in left. Electronically Signed   By: Kerby Moors M.D.   On: 05/15/2016 13:39   Dg Chest Port 1 View  05/14/2016  CLINICAL DATA:  Follow-up of pneumonia, acute and chronic respiratory failure, history of COPD and lung malignancy EXAM: PORTABLE CHEST 1 VIEW COMPARISON:  Portable chest x-ray of May 13, 2016. FINDINGS: The lungs are well-expanded. Persistent confluent alveolar opacities are present in the left upper hemithorax. There is a stable small left pleural effusion. There is stable linear density at the left lung base. The heart is top-normal in size. The pulmonary vascularity is not  engorged. The trachea is midline. The bony thorax exhibits no acute abnormality. IMPRESSION: Bibasilar atelectasis with small right pleural effusion. Stable alveolar opacity in the right upper lobe. Electronically Signed   By: David  Martinique M.D.   On: 05/14/2016 07:43   Dg Chest Port 1 View  05/13/2016  CLINICAL DATA:  Patient with history of pneumonia. EXAM: PORTABLE CHEST 1 VIEW COMPARISON:  Chest radiograph 05/12/2016. FINDINGS: Interval extubation and removal of enteric tube. Stable cardiac and mediastinal contours. Multiple monitoring leads overlie the patient. Improved aeration of the left lower lung. Small layering right pleural effusion with underlying opacities favored to represent atelectasis. Unchanged right upper lobe parenchymal opacity. No pneumothorax. IMPRESSION: Interval extubation and removal of enteric tube. Small layering right pleural effusion with underlying opacities favored to represent atelectasis. Improved aeration of the left lung base. Electronically Signed   By: Lovey Newcomer M.D.   On: 05/13/2016 07:18   Dg Chest Port 1 View  05/12/2016  CLINICAL DATA:  Respiratory failure. EXAM: PORTABLE CHEST 1 VIEW COMPARISON:  05/11/2016. 04/13/2016. 03/29/2016. 04/25/2015 . CT 01/23/2016 . FINDINGS: Endotracheal tube, NG tube in good anatomic position. Endotracheal tube tip 3 cm above the carina. Cardiomegaly. Increased interstitial markings in both lung bases with bilateral pleural effusions. These findings suggest mild congestive heart failure. Bibasilar pneumonia cannot be excluded. Right upper lobe pleural parenchymal thickening again noted consistent with scarring/radiation change and pulmonary nodule as noted on prior studies. No significant interim change. IMPRESSION: 1. Lines and tubes in good anatomic position. 2. Cardiomegaly with increased interstitial markings in the lung bases and small bilateral pleural effusions. These findings suggest congestive heart failure. Mild bibasilar  pneumonia cannot be excluded. 3. Persistent right upper lobe pleural parenchymal thickening consistent with previously identified scarring/radiation change and pulmonary nodule. No significant interim change. Electronically Signed   By: Marcello Moores  Register   On: 05/12/2016 07:48   Dg Chest Portable 1 View  05/11/2016  CLINICAL DATA:  Endotracheal tube repositioning.  Initial encounter. EXAM: PORTABLE CHEST 1 VIEW COMPARISON:  Chest radiograph performed earlier today at 12:23 a.m. FINDINGS: The patient's endotracheal tube is seen ending 3-4 cm above the carina. An enteric tube is noted extending below the diaphragm. 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 Right basilar  airspace opacity could reflect mild infection. The left lung appears grossly clear. No pleural effusion or pneumothorax is seen. The cardiomediastinal silhouette is normal in size. No acute osseous abnormalities are identified. There is chronic superior subluxation of the left humeral head, reflecting a chronic left-sided rotator cuff tear. IMPRESSION: 1. Endotracheal tube seen ending 3-4 cm above the carina. 2. Right-sided radiation fibrosis again noted. Right basilar airspace opacity could reflect mild infection. Electronically Signed   By: Garald Balding M.D.   On: 05/11/2016 02:32   Dg Chest Port 1 View  05/11/2016  CLINICAL DATA:  Intubation. EXAM: PORTABLE CHEST 1 VIEW COMPARISON:  Yesterday at 2257 hour FINDINGS: Endotracheal tube 7.5 cm from the carina. Enteric tube is in place, tip below the diaphragm, side-port not well visualized. Improved lung aeration from pre intubation exam. Persistent right basilar hazy opacity likely pleural effusion. The right upper lobe opacity has diminished in the interim. Left basilar subsegmental atelectasis. Cardiomediastinal contours are unchanged. IMPRESSION: 1. Endotracheal tube 7.5 cm from the carina. Enteric tube in place, tip below the diaphragm. 2. Improved lung aeration from pre intubation  exam. Right pleural effusion again seen. Right suprahilar opacity with question mild decrease in the interim versus differences in technique. Electronically Signed   By: Jeb Levering M.D.   On: 05/11/2016 00:36   Dg Chest Port 1 View  05/10/2016  CLINICAL DATA:  Shortness of breath. Patient with history of lung cancer in radiation. EXAM: PORTABLE CHEST 1 VIEW COMPARISON:  Radiographs 04/13/2016.  CT 01/23/2016 FINDINGS: Lungs remain hyperinflated with emphysema. Right suprahilar opacity has progressed from prior radiograph. Hazy opacity in the right lower lung zone consistent with pleural effusion, likely small to moderate in size. No focal abnormality in the left lung. Heart size and mediastinal contours are unchanged. IMPRESSION: 1. Hazy opacity at the right lung base suspicious for pleural effusion, likely small to moderate in size. 2. Right suprahilar opacity, patient with history of radiation fibrosis and adjacent pulmonary nodule on CT. This appears increased in degree compared to prior radiograph, however uncertain to what extent this is secondary to differences in technique. Electronically Signed   By: Jeb Levering M.D.   On: 05/10/2016 23:25    Microbiology: Recent Results (from the past 240 hour(s))  Culture, blood (Routine X 2) w Reflex to ID Panel     Status: None   Collection Time: 05/11/16  1:30 AM  Result Value Ref Range Status   Specimen Description BLOOD RIGHT FOREARM  Final   Special Requests IN PEDIATRIC BOTTLE 1ML  Final   Culture NO GROWTH 5 DAYS  Final   Report Status 05/16/2016 FINAL  Final  Culture, blood (Routine X 2) w Reflex to ID Panel     Status: None   Collection Time: 05/11/16  1:55 AM  Result Value Ref Range Status   Specimen Description BLOOD LEFT ARM  Final   Special Requests IN PEDIATRIC BOTTLE 4ML  Final   Culture NO GROWTH 5 DAYS  Final   Report Status 05/16/2016 FINAL  Final  MRSA PCR Screening     Status: None   Collection Time: 05/11/16  4:02 AM   Result Value Ref Range Status   MRSA by PCR NEGATIVE NEGATIVE Final    Comment:        The GeneXpert MRSA Assay (FDA approved for NASAL specimens only), is one component of a comprehensive MRSA colonization surveillance program. It is not intended to diagnose MRSA infection nor to guide or monitor  treatment for MRSA infections.   Culture, respiratory (NON-Expectorated)     Status: None   Collection Time: 05/11/16  4:54 AM  Result Value Ref Range Status   Specimen Description ENDOTRACHEAL  Final   Special Requests NONE  Final   Gram Stain   Final    FEW WBC PRESENT, PREDOMINANTLY PMN FEW SQUAMOUS EPITHELIAL CELLS PRESENT ABUNDANT GRAM POSITIVE COCCI IN PAIRS FEW GRAM NEGATIVE RODS RARE YEAST RARE GRAM POSITIVE RODS    Culture Consistent with normal respiratory flora.  Final   Report Status 05/14/2016 FINAL  Final     Labs: Basic Metabolic Panel:  Recent Labs Lab 05/11/16 0133 05/11/16 0422 05/11/16 1753 05/12/16 7829  05/13/16 0330 05/14/16 0522 05/15/16 0348 05/16/16 0337 05/17/16 0457  NA 138 140  --  138  < > 141 140 142 142 141  K 4.8 4.5  --  3.8  < > 4.0 4.0 4.5 3.6 3.5  CL 97* 102  --  99*  < > 99* 91* 88* 90* 89*  CO2 34* 30  --  32  < > 36* 41* 47* 45* 46*  GLUCOSE 209* 201*  --  239*  < > 267* 247* 134* 129* 115*  BUN 12 12  --  18  < > _0 CREATININE 1.00 0.89  --  0.89  < > 1.03 0.84 0.80 0.69 0.68  CALCIUM 8.7* 8.4*  --  8.6*  < > 8.8* 8.8* 9.1 8.9 8.9  MG 1.9 1.8 2.0 2.0  --   --   --  2.1  --   --   PHOS 4.0 3.8 2.6 2.7  --   --   --  3.4  --   --   < > = values in this interval not displayed. Liver Function Tests:  Recent Labs Lab 05/10/16 2255 05/11/16 0133  AST 19 23  ALT 20 20  ALKPHOS 58 55  BILITOT 0.4 0.6  PROT 6.2* 5.9*  ALBUMIN 3.8 3.5   No results for input(s): LIPASE, AMYLASE in the last 168 hours. No results for input(s): AMMONIA in the last 168 hours. CBC:  Recent Labs Lab 05/10/16 2255 05/11/16 0422  05/13/16 0330 05/15/16 0348 05/16/16 0337  WBC 7.1 6.3 8.7 7.3 6.6  NEUTROABS 5.3  --   --   --   --   HGB 12.7* 11.2* 11.1* 11.5* 12.1*  HCT 42.1 37.0* 36.4* 38.3* 40.2  MCV 97.2 95.6 95.8 95.8 94.8  PLT 238 186 191 181 153   Cardiac Enzymes: No results for input(s): CKTOTAL, CKMB, CKMBINDEX, TROPONINI in the last 168 hours. BNP: BNP (last 3 results)  Recent Labs  03/29/16 2246 05/10/16 2255  BNP 45.8 80.8    ProBNP (last 3 results) No results for input(s): PROBNP in the last 8760 hours.  CBG:  Recent Labs Lab 05/16/16 0737 05/16/16 1128 05/16/16 1620 05/17/16 0031 05/17/16 0740  GLUCAP 133* 176* 114* 223* 95    Discussed in detail with patient's daughter Mrs. Brent Wade and her husband at bedside. Updated care and answered questions.   Signed:  Vernell Leep, MD, FACP, FHM. [facility] Pager [phone]  If 7PM-7AM, please contact night-coverage [email or web address] Password [email or web address] 05/17/2016, 4:16 PM

## 2016-05-17 NOTE — Progress Notes (Signed)
Chaplain responded to consult to spiritual care. Comments suggested the patient is nearing the end of life. Patient asked for prayer which was offered on his behalf. Chaplain will follow up after decision is made with hospice.  Darene Lamer Clemmie Buelna 12:37 PM    05/17/16 1200  Clinical Encounter Type  Visited With Patient  Visit Type Initial;Spiritual support;Other (Comment) (Spiritual Consult)  Referral From Palliative care team  Spiritual Encounters  Spiritual Needs Prayer;Emotional;Grief support

## 2016-05-17 NOTE — Care Management Note (Signed)
Case Management Note  Patient Details  Name: Brent Wade MRN: [id number] Date of Birth: 1941-11-27  Subjective/Objective: Pt in for Acute Respiratory Failure. Plan is for d/c today to [facility].                     Action/Plan: CSW assisting with disposition needs. No further needs from CM at this time.   Expected Discharge Date:                  Expected Discharge Plan:  [place]  In-House Referral:  Clinical Social Work  Discharge planning Services  CM Consult  Post Acute Care Choice:  NA Choice offered to:  NA  DME Arranged:  N/A DME Agency:  NA  HH Arranged:  NA HH Agency:  NA  Status of Service:  Completed, signed off  Medicare Important Message Given:  Yes Date Medicare IM Given:    Medicare IM give by:    Date Additional Medicare IM Given:    Additional Medicare Important Message give by:     If discussed at [place] of Stay Meetings, dates discussed:    Additional Comments:  Bethena Roys, RN 05/17/2016, 4:26 PM

## 2016-05-17 NOTE — Progress Notes (Signed)
Telemetry discontinued, per MD order.

## 2016-05-17 NOTE — Progress Notes (Signed)
PULMONARY / CRITICAL CARE MEDICINE   Name: Brent Wade MRN: [id number] DOB: 11-Oct-1941    ADMISSION DATE:  05/10/2016  REFERRING MD:  Dr. Rogene [place]  CHIEF COMPLAINT:  Short of breath  HISTORY OF PRESENT ILLNESS:   Hx from chart.  75 yo male brought to ER with progressive dyspnea.  He was also hypoxic.  He took pain medication earlier in the day.  He was found to be lethargic and had respiratory acidosis on ABG.  He was tried on BiPAP but continued to get worse.  He required intubation.  He has hx of GOLD D COPD, NSCLC s/p XRT, systolic CHF, PAF on coumadin.  He was in hospital recently for AECOPD.   SUBJECTIVE:  Looks comfortable VITAL SIGNS: BP 136/83 mmHg  Pulse 104  Temp(Src) 98.7 F (37.1 C) (Oral)  Resp 27  Ht '5\' 6"'$  (1.676 m)  Wt 150 lb 9.2 oz (68.3 kg)  BMI 24.31 kg/m2  SpO2 97% 5 liters  HEMODYNAMICS:    VENTILATOR SETTINGS: Vent Mode:  [-]  FiO2 (%):  [40 %] 40 %  INTAKE / OUTPUT:  Intake/Output Summary (Last 24 hours) at 05/17/16 1401 Last data filed at 05/17/16 1354  Gross per 24 hour  Intake   1040 ml  Output   1950 ml  Net   -910 ml     PHYSICAL EXAMINATION: General: frail; 75 year old male, not able to arouse. sedated Neuro: difficult to arouse, grimaces HEENT:  NCAT, MMM, no JVD Cardiovascular:  Irregular no MRG Lungs:  Prolonged exhalation,decreased bs bases Abdomen:  Soft, non tender Musculoskeletal:  3+ edema-->improved Skin:  No rashes  LABS:  BMET  Recent Labs Lab 05/15/16 0348 05/16/16 0337 05/17/16 0457  NA 142 142 141  K 4.5 3.6 3.5  CL 88* 90* 89*  CO2 47* 45* 46*  BUN '19 18 18  '$ CREATININE 0.80 0.69 0.68  GLUCOSE 134* 129* 115*    Electrolytes  Recent Labs Lab 05/11/16 1753 05/12/16 0458  05/15/16 0348 05/16/16 0337 05/17/16 0457  CALCIUM  --  8.6*  < > 9.1 8.9 8.9  MG 2.0 2.0  --  2.1  --   --   PHOS 2.6 2.7  --  3.4  --   --   < > = values in this interval not displayed.  CBC  Recent Labs Lab  05/13/16 0330 05/15/16 0348 05/16/16 0337  WBC 8.7 7.3 6.6  HGB 11.1* 11.5* 12.1*  HCT 36.4* 38.3* 40.2  PLT 191 181 153    Coag's  Recent Labs Lab 05/15/16 0348 05/16/16 0337 05/17/16 0457  INR 1.96* 1.79* 1.73*    Sepsis Markers  Recent Labs Lab 05/11/16 0133 05/11/16 0427 05/12/16 0458 05/13/16 0330  LATICACIDVEN 2.0 2.1*  --   --   PROCALCITON <0.10  --  <0.10 <0.10    ABG  Recent Labs Lab 05/10/16 2358 05/11/16 0152 05/11/16 0448  PHART 7.176* 7.253* 7.345*  PCO2ART 97.7* 75.8* 57.6*  PO2ART 164.0* 107.0* 116.0*    Liver Enzymes  Recent Labs Lab 05/10/16 2255 05/11/16 0133  AST 19 23  ALT 20 20  ALKPHOS 58 55  BILITOT 0.4 0.6  ALBUMIN 3.8 3.5    Cardiac Enzymes No results for input(s): TROPONINI, PROBNP in the last 168 hours.  Glucose  Recent Labs Lab 05/15/16 2052 05/16/16 0737 05/16/16 1128 05/16/16 1620 05/17/16 0031 05/17/16 0740  GLUCAP 212* 133* 176* 114* 223* 95    Imaging No results found.PCXR Aeration improved. Still  w/ right basilar atx/effusion. But improved.   STUDIES:  03/27/12 PFT >> FEV1 1.08 (40%), FEV1% 40, TLC 7.16 (119%), DLCO 67%, +BD  CULTURES: 5/30 Blood >> 5/30 Sputum: rarer GNR and rare GPR>>nl flora  ANTIBIOTICS: 5/30 Fortaz >> 5/30 Vancomycin >> 5/31  SIGNIFICANT EVENTS: 5/29 Admit 5/31 extubated; required NIPPV and extra diuresis that night.  6/1 getting lasix and cycling BIPAP. Still frail 6/2 looking better. BIPAP changed to PRN and HS. Cont lasix. ECHO still pending. Getting OOB  6/3 PCCM asked to reevaluate for resp distress. No distress on exam. 6/5 full comfort care  LINES/TUBES: 5/30 ETT >> 5/31    ASSESSMENT / PLAN:  Acute on chronic hypoxic/hypercapnic respiratory failure 2nd to HCAP, AECOPD. Small right effusion Hx of PAF, HTN, HLD, chronic systolic CHF w/ pulmonary edema Severe deconditioning Hx of Lt nephrectomy. Hx of NSCLC (adenocarcinoma) s/p XRT.   -->extubated  5/31.  -->right basilar atx/effusion. Improved overall aeration & WOB Plan:   Cont lasix for negative volume status as tolerated Now full comfort care BIPAP PRN (for now; but once comfort regimen is established would dc) Scheduled BDs Cont flutter and IS Wean O2  DISCUSSION: 75 yo male with AECOPD, VDRF, HCAP.  Hx of NSCLC s/p XRT, systolic CHF, PAF. Extubated 5/3. CXR was slowly clearing. Clinically looked better w/ lasix, but has not been able to fully recover from his acute illness. Now transitioned to palliative care goals.   PCCM will s/o  Erick Colace ACNP-BC [place] Pager # [phone] OR # [phone] if no answer   05/17/2016,  05/17/2016, 2:01 PM   Attending Note:  75 year old male with AE-COPD and HCAP, history of NSCLC who was intubated and subsequently extubated.  Now full DNR.  Patient was lethargic but arousable on exam.  I reviewed CXR myself, infiltrate noted.  Discussed with PCCM-NP.  Respiratory failure: multi-factorial.  - Insure comfort.  - O2 as needed.  - BiPAP for comfort.  Hypoxemia:  - Titrate O2 for sat of 88-92%.  - Insure no O2 sat above 95%, retains CO2.  COPD:  - Bronchodilators as ordered.  - No further need for evaluation/treatment of issue above.  Palliation: patient is end-stage, no further need for aggressive interventions.  BiPAP is only for comfort at this point.  Transition to palliative mode.  PCCM will sign off, please call back if needed.  Patient seen and examined, agree with above note.  I dictated the care and orders written for this patient under my direction.  Rush Farmer, MD [phone]

## 2016-05-17 NOTE — Plan of Care (Signed)
Problem: Phase I Progression Outcomes Goal: O2 sats > or equal 90% or at baseline Outcome: Completed/Met Date Met:  05/17/16 02 saturations greater than or equal to 90% throughout this shift on 4L nasal cannula.

## 2016-05-17 NOTE — Progress Notes (Signed)
BRIEF COMFORT NOTE  Pt off vent. Pt was to be seen for f/u d/t change in status. However, per notes, pt is transitioning to hospice/comfort care. Per palliative note, "Estimated 2-3 weeks. Patient is bed bound with labile pulse and respiration rate. Unable to breath comfortably even at complete rest. No further treatment options available."At this point, no nutrition interventions are warranted. Consult if further nutrition issues arise.  Geoffery Lyons, [place] [facility] Dietetic Intern Pager [phone]

## 2016-05-17 NOTE — Progress Notes (Signed)
RT came to give neb tx- pt unavailable at this time. RT will continue to follow

## 2016-05-17 NOTE — Progress Notes (Signed)
ANTICOAGULATION CONSULT NOTE - Follow Up Consult  Pharmacy Consult for Lovenox / Warfarin Indication: atrial fibrillation  No Known Allergies  Patient Measurements: Height: '5\' 6"'$  (167.6 cm) Weight: 150 lb 9.2 oz (68.3 kg) IBW/kg (Calculated) : 63.8  Vital Signs: Temp: 98.7 F (37.1 C) (06/05 0600) Temp Source: Oral (06/05 0600) BP: 140/85 mmHg (06/05 0841) Pulse Rate: 117 (06/05 0858)  Labs:  Recent Labs  05/15/16 0348 05/16/16 0337 05/17/16 0457  HGB 11.5* 12.1*  --   HCT 38.3* 40.2  --   PLT 181 153  --   LABPROT 22.3* 20.8* 20.2*  INR 1.96* 1.79* 1.73*  CREATININE 0.80 0.69 0.68    Estimated Creatinine Clearance: 70.9 mL/min (by C-G formula based on Cr of 0.68).   Medications:  Prior to Admit - Coumadin 7.'5mg'$  on Sundays, '5mg'$  all other days  Assessment: 75 year old male admitted with COPD exacerbation on chronic warfarin for atrial fibrillation PTA. Pharmacy consulted to dose warfarin.  INR 1.73 today LFTs wnl, Hgb 11, plt wnl.   Currently on enox '1mg'$ /kg = 75 mg BID. No noted bleeding. May need a 7.5 mg dose twice weekly to maintain INR within goal range.   Goal of Therapy:  Anti-Xa level 0.6-1 units/ml 4hrs after LMWH dose given Monitor platelets by anticoagulation protocol: Yes   Plan:  Enoxaparin 1 mg/kg (75 mg)  SQ q12h  Warfarin 7.5 mg x 1 tonight Daily INR, CBC at least q72h Monitor clinical course, s/sx of bleed, PO intake, DDI   Thank you for allowing [place] to participate in this patients care. Jens Som, PharmD Pager: [phone]  05/17/2016 10:20 AM

## 2016-05-17 NOTE — Care Management Important Message (Signed)
Important Message  Patient Details  Name: Brent Wade MRN: [id number] Date of Birth: 06-27-1941   Medicare Important Message Given:  Yes    Nathen May 05/17/2016, 12:08 PM

## 2016-05-17 NOTE — Progress Notes (Signed)
PT Cancellation Note/Discharge  Patient Details Name: Brent Wade MRN: [id number] DOB: [birth year]   Cancelled Treatment:    Reason Eval/Treat Not Completed: Medical issues which prohibited therapy.  Pt is dyspnic even at rest, pt/family pursuing hospice SNF placement.  PT to sign off as mobility certainly increases his WOB/anxiety.  Please re consult if needed.    Thanks,    Barbarann Ehlers. [place], [place], DPT [phone]   05/17/2016, 4:07 PM

## 2016-05-17 NOTE — Progress Notes (Signed)
PROGRESS NOTE  Brent Wade  TMH:[id number] DOB: 1941/08/23  DOA: 05/10/2016 PCP: Simona Huh, MD   Brief Narrative:  75 year old male with a PMH of GOLD D COPD, NSCLC s/p XRT, systolic CHF, PAF on coumadin, recently hospitalized for AECOPD, presented to ED with progressive dyspnea and hypoxia. He took pain medication earlier that day. He was found to be lethargic and had respiratory acidosis on ABG. He was tried on BiPAP but continued to get worse. He required intubation and ICU admission by CCM for AECOPD, VDRF. Extubated 5/31. Felt to have improved. Transferred to floor and TRH on 6/3. Overnight 6/2, issues with dyspnea requiring BiPAP. Requested CCM to continue to follow on 6/3.   Assessment & Plan:   Active Problems:   Acute respiratory failure (HCC)   Acidemia   Acute on chronic respiratory failure with hypoxia and hypercapnia (HCC)   Delirium   Pneumonia   Acute on chronic hypoxic and hypercapnic respiratory failure d/t AECOPD & HCAP - Required intubation and management by CCM in ICU. Extubated 5/31. - Continue antibiotics (IV Tressie Ellis), IV Solu-Medrol, oxygen, when necessary BiPAP, bronchodilators, flutter valve and incentive spirometry.  - CCM follow-up 6/3 appreciated: Advanced COPD, multiple recent exacerbations, continued increased work of breathing requiring BiPAP, recommend goals of care discussion >requested RN to coordinate with patient, family and CCM regarding meeting for same. Patient considering "I don't want the tube". Palliative care also consulted for GOC - Blood cultures negative to date. MRSA screen negative - Metabolic alkalosis/bicarbonate 47-multifactorial related to secondary respiratory compensation and diuresis.? Trial of a dose of acetazolamide. - Repeat chest x-ray 6/3 shows no change in RUL opacity and mild pleural effusions right >left. - Persistent dyspnea despite all aggressive measures. CCM input appreciated. DO NOT RESUSCITATE/DO NOT INTUBATE  established. Palliative care consulted. Patient and family opting for residential hospice. Poor overall prognosis. - Titrating morphine for dyspnea and Ativan for anxiety.  Paroxysmal A. Fib - Controlled ventricular rate on monitor. INR almost therapeutic/1.94. On Lovenox bridge until INR therapeutic >2. INR: 1.7 - Continue diltiazem, digoxin. - Clinically compensated.   Essential hypertension - Controlled  Chronic systolic CHF - No peripheral edema. Does not seem overtly volume overloaded. Repeat chest x-ray does not indicate pulmonary edema. - 2-D echo: Normal EF. Results as below.  Hyperlipidemia - Continue statins  History of left nephrectomy - Creatinine normal. Monitor BMP periodically.  GERD - PPI.  NSCLC (adenocarcinoma) s/p XRT. - Follows with Dr. Pablo Ledger, radiation oncologist. As per pulmonologist, patient may have recurrent lung cancer but has refused salvage chemotherapy.  Type II DM/steroid-induced hyperglycemia - Hold Amaryl and Glucophage. - On Lantus and SSI. Adjust insulin's-added mealtime NovoLog. CBGs better/fluctuating. Check [id number]  Acute metabolic encephalopathy - Resolved  Physical deconditioning - Multifactorial. Now transitioning to residential hospice.  Anemia - Stable.  Adult failure to thrive - Multifactorial. Palliative care consultation appreciated and discussed with them. Patient and family have opted for residential hospice.    DVT prophylaxis: Lovenox bridging and Coumadin Code Status: Full Family Communication: Discussed with patient. No family at bedside. Left VM message for Mr. Zyire Eidson on 6/4, patient's son. Discussed with patient's daughter and son-in-law at bedside on 6/5 Disposition Plan: DC to residential hospice when bed available.   Consultants:   CCM  Procedures:   Extubated 5/31  2-D echo 05/14/69: Study Conclusions  - Left ventricle: The cavity size was normal. Systolic function was  normal. The  estimated ejection fraction was in the range of 55%  to 60%. Wall motion was normal; there were no regional wall  motion abnormalities. - Aortic valve: Moderate diffuse thickening and calcification,  consistent with sclerosis. The left and noncoronary cusps are  heavily calcified with reduced leaflet excursion. - Tricuspid valve: There was trivial regurgitation.  Antimicrobials:   IV Tressie Ellis 5/29 >consider discontinuing after 6/4 dose  IV vancomycin 5/29 > 5/30    Subjective: Chronic dyspnea, even at rest despite all measures.  Objective:  Filed Vitals:   05/17/16 0841 05/17/16 0858 05/17/16 1200 05/17/16 1213  BP: 140/85  136/83   Pulse: 106 117 111 104  Temp:      TempSrc:      Resp: _0 Height:      Weight:      SpO2: 88%  96% 95%    Intake/Output Summary (Last 24 hours) at 05/17/16 1214 Last data filed at 05/17/16 0558  Gross per 24 hour  Intake    560 ml  Output   1375 ml  Net   -815 ml   Filed Weights   05/15/16 0500 05/16/16 0616 05/17/16 0602  Weight: 69.219 kg (152 lb 9.6 oz) 69.3 kg (152 lb 12.5 oz) 68.3 kg (150 lb 9.2 oz)    Examination:  General exam: Pleasant elderly male, moderately built, extremely frail, chronically ill-looking lying propped up in bed with ongoing mild respiratory distress and wheezing. Respiratory system: Reduced breath sounds bilaterally with scattered rhonchi. Mild increased work of breathing. Cardiovascular system: S1 & S2 heard, irregularly irregular. No JVD, murmurs, rubs, gallops or clicks. No pedal edema. Telemetry: A. fib with controlled ventricular rate. Gastrointestinal system: Abdomen is nondistended, soft and nontender. No organomegaly or masses felt. Normal bowel sounds heard. Central nervous system: Alert and oriented. No focal neurological deficits. Extremities: Symmetric 5 x 5 power. Skin: No rashes, lesions or ulcers Psychiatry: Judgement and insight appear normal. Mood & affect appropriate.     Data  Reviewed: I have personally reviewed following labs and imaging studies  CBC:  Recent Labs Lab 05/10/16 2255 05/11/16 0422 05/13/16 0330 05/15/16 0348 05/16/16 0337  WBC 7.1 6.3 8.7 7.3 6.6  NEUTROABS 5.3  --   --   --   --   HGB 12.7* 11.2* 11.1* 11.5* 12.1*  HCT 42.1 37.0* 36.4* 38.3* 40.2  MCV 97.2 95.6 95.8 95.8 94.8  PLT 238 186 191 181 704   Basic Metabolic Panel:  Recent Labs Lab 05/11/16 0133 05/11/16 0422 05/11/16 1753 05/12/16 0458  05/13/16 0330 05/14/16 0522 05/15/16 0348 05/16/16 0337 05/17/16 0457  NA 138 140  --  138  < > 141 140 142 142 141  K 4.8 4.5  --  3.8  < > 4.0 4.0 4.5 3.6 3.5  CL 97* 102  --  99*  < > 99* 91* 88* 90* 89*  CO2 34* 30  --  32  < > 36* 41* 47* 45* 46*  GLUCOSE 209* 201*  --  239*  < > 267* 247* 134* 129* 115*  BUN 12 12  --  18  < > _1 CREATININE 1.00 0.89  --  0.89  < > 1.03 0.84 0.80 0.69 0.68  CALCIUM 8.7* 8.4*  --  8.6*  < > 8.8* 8.8* 9.1 8.9 8.9  MG 1.9 1.8 2.0 2.0  --   --   --  2.1  --   --   PHOS 4.0 3.8 2.6 2.7  --   --   --  3.4  --   --   < > = values in this interval not displayed. GFR: Estimated Creatinine Clearance: 70.9 mL/min (by C-G formula based on Cr of 0.68). Liver Function Tests:  Recent Labs Lab 05/10/16 2255 05/11/16 0133  AST 19 23  ALT 20 20  ALKPHOS 58 55  BILITOT 0.4 0.6  PROT 6.2* 5.9*  ALBUMIN 3.8 3.5   No results for input(s): LIPASE, AMYLASE in the last 168 hours. No results for input(s): AMMONIA in the last 168 hours. Coagulation Profile:  Recent Labs Lab 05/13/16 0330 05/14/16 0522 05/15/16 0348 05/16/16 0337 05/17/16 0457  INR 1.64* 1.72* 1.96* 1.79* 1.73*   Cardiac Enzymes: No results for input(s): CKTOTAL, CKMB, CKMBINDEX, TROPONINI in the last 168 hours. BNP (last 3 results) No results for input(s): PROBNP in the last 8760 hours. HbA1C: No results for input(s): HGBA1C in the last 72 hours. CBG:  Recent Labs Lab 05/16/16 0737 05/16/16 1128  05/16/16 1620 05/17/16 0031 05/17/16 0740  GLUCAP 133* 176* 114* 223* 95   Lipid Profile: No results for input(s): CHOL, HDL, LDLCALC, TRIG, CHOLHDL, LDLDIRECT in the last 72 hours. Thyroid Function Tests: No results for input(s): TSH, T4TOTAL, FREET4, T3FREE, THYROIDAB in the last 72 hours. Anemia Panel: No results for input(s): VITAMINB12, FOLATE, FERRITIN, TIBC, IRON, RETICCTPCT in the last 72 hours.  Sepsis Labs:  Recent Labs Lab 05/11/16 0133 05/11/16 0427 05/12/16 0458 05/13/16 0330  PROCALCITON <0.10  --  <0.10 <0.10  LATICACIDVEN 2.0 2.1*  --   --     Recent Results (from the past 240 hour(s))  Culture, blood (Routine X 2) w Reflex to ID Panel     Status: None   Collection Time: 05/11/16  1:30 AM  Result Value Ref Range Status   Specimen Description BLOOD RIGHT FOREARM  Final   Special Requests IN PEDIATRIC BOTTLE 1ML  Final   Culture NO GROWTH 5 DAYS  Final   Report Status 05/16/2016 FINAL  Final  Culture, blood (Routine X 2) w Reflex to ID Panel     Status: None   Collection Time: 05/11/16  1:55 AM  Result Value Ref Range Status   Specimen Description BLOOD LEFT ARM  Final   Special Requests IN PEDIATRIC BOTTLE 4ML  Final   Culture NO GROWTH 5 DAYS  Final   Report Status 05/16/2016 FINAL  Final  MRSA PCR Screening     Status: None   Collection Time: 05/11/16  4:02 AM  Result Value Ref Range Status   MRSA by PCR NEGATIVE NEGATIVE Final    Comment:        The GeneXpert MRSA Assay (FDA approved for NASAL specimens only), is one component of a comprehensive MRSA colonization surveillance program. It is not intended to diagnose MRSA infection nor to guide or monitor treatment for MRSA infections.   Culture, respiratory (NON-Expectorated)     Status: None   Collection Time: 05/11/16  4:54 AM  Result Value Ref Range Status   Specimen Description ENDOTRACHEAL  Final   Special Requests NONE  Final   Gram Stain   Final    FEW WBC PRESENT, PREDOMINANTLY  PMN FEW SQUAMOUS EPITHELIAL CELLS PRESENT ABUNDANT GRAM POSITIVE COCCI IN PAIRS FEW GRAM NEGATIVE RODS RARE YEAST RARE GRAM POSITIVE RODS    Culture Consistent with normal respiratory flora.  Final   Report Status 05/14/2016 FINAL  Final         Radiology Studies: Dg Chest Port 1 View  05/15/2016  CLINICAL DATA:  Pneumonia followup EXAM: PORTABLE CHEST 1 VIEW COMPARISON:  05/14/16 FINDINGS: Normal heart size. There is persistent opacity within the right upper lobe which appears unchanged from previous exam. Bilateral pleural effusions are identified right greater in left. This is unchanged from previous exam. IMPRESSION: 1. No change in right upper lobe opacity. 2. Persistent pleural effusions, right greater in left. Electronically Signed   By: Kerby Moors M.D.   On: 05/15/2016 13:39        Scheduled Meds: . atorvastatin  20 mg Oral q1800  . chlorhexidine gluconate (SAGE KIT)  15 mL Mouth Rinse BID  . digoxin  0.125 mg Oral Daily  . diltiazem  60 mg Oral Q12H  . enoxaparin (LOVENOX) injection  75 mg Subcutaneous Q12H  . insulin aspart  0-5 Units Subcutaneous QHS  . insulin aspart  0-9 Units Subcutaneous TID WC  . insulin aspart  3 Units Subcutaneous TID WC  . insulin glargine  20 Units Subcutaneous Daily  . ipratropium-albuterol  3 mL Nebulization Q4H  . methylPREDNISolone (SOLU-MEDROL) injection  80 mg Intravenous Q12H  . pantoprazole  40 mg Oral Daily  . warfarin  7.5 mg Oral ONCE-1800  . Warfarin - Pharmacist Dosing Inpatient   Does not apply q1800   Continuous Infusions: . sodium chloride 20 mL/hr at 05/14/16 0300     LOS: 6 days    Time spent: 20 minutes.    [facility], MD [facility] Pager [phone]  If 7PM-7AM, please contact night-coverage [email or web address] Password [email or web address] 05/17/2016, 12:14 PM

## 2016-05-17 NOTE — Progress Notes (Signed)
Pt refusing BG checks and insulin at this time.   Pt requesting more Morphine states, " its helping."  Will continue to monitor.

## 2016-05-17 NOTE — Progress Notes (Signed)
CSW received consult for hospice placement- per palliative note pt is appropriate for placement at this time and family is agreeable.  CSW spoke with pt dtr, [name], about which facility they would prefer.  [name] states she lives in [place] and her brother lives in [place]- would be agreeable to [facility], [facility], [facility] in [place] or [facility] in [place] made referrals and awaiting review  Domenica Reamer, [place] Worker [phone]

## 2016-05-17 NOTE — Progress Notes (Signed)
Patient called for assistance.  Patient was sitting on side of bed when RN entered room.  Patient then swung legs back up in bed and laid back down, supine position.  Patient was extremely short of breath with that activity and oxygen saturation in the 70's.  RN and nurse tech pulled patient up in bed and elevated head of bed.  RN instructed patient on pursed lip breathing.  After a few minutes oxygen saturation back in the 90's.

## 2016-05-17 NOTE — Progress Notes (Signed)
Patient will discharge to [facility]  Anticipated discharge date: 6/5 Family notified: at bedside Transportation by [facility]- called at 4:30pm  CSW signing off.  Domenica Reamer, [place] Social Worker [phone]

## 2016-05-17 NOTE — Progress Notes (Signed)
RN has administered PRN morphine this shift for patient complaint of shortness of breath at rest.  Patient has been using accessory muscles to breath throughout shift.  After morphine administration shortness of breath at rest is decreased, patient appears to rest more comfortably, accessory muscle use lessened.

## 2016-05-17 NOTE — Consult Note (Signed)
Daily Progress Note   Patient Name: Brent Wade       Date: 05/17/2016 DOB: 03-26-1941  Age: 75 y.o. MRN#: [id number] Attending Physician: Modena Jansky, MD Primary Care Physician: Simona Huh, MD Admit Date: 05/10/2016  75 yo male with end stage COPE, NSCLU, S-CHF, Afib, DM, who was admitted with HCAP and COPD exacerbation.  Per EPIC notes he may have recurrent lung cancer but has refused "salvage" chemotherapy.  He was admitted on 5/30 and unfortunately intubated.  He was extubated on 5/31 and has been on "full court" COPD therapy including nebulizers, steroids, antibiotics, and inhalers - unfortunately his breathing is very labored even at rest.  He is unable to speak without gasping for air.  The patient and his family understand that he is at end of life.   Reason for Consultation/Follow-up: Establishing goals of care  Subjective:  Patient is very SOB.  He complains of not being able to sleep due to SOB, poor appetite, constipation, and "I feel miserable".  We discussed his current medical status and that there are no other treatment options.  We discussed hospice services.  The patients wife passed with the support of hospice to the patient and his daughter are familiar with [facility].   The patient lived with his dtr in law prior to admission and was still fairly independent.  Mr. Skoda is a devout [employer] and would welcome a chaplain visit.  Dtr [name] told me outside of the room that her father does not want to be dependent for ADLs on the family.  At this point he is completely bed bound secondary to dyspnea, deconditioning and malnourishment.   [name] believes her father would prefer Hospice home (Residential Hospice) to dying at home.  [name] works night shift - she asks that the  hospice representative call her to arrange a meeting time.   Length of Stay: 6  Current Medications: Scheduled Meds:  . atorvastatin  20 mg Oral q1800  . chlorhexidine gluconate (SAGE KIT)  15 mL Mouth Rinse BID  . digoxin  0.125 mg Oral Daily  . diltiazem  60 mg Oral Q12H  . enoxaparin (LOVENOX) injection  75 mg Subcutaneous Q12H  . insulin aspart  0-5 Units Subcutaneous QHS  . insulin aspart  0-9 Units Subcutaneous TID WC  . insulin aspart  3 Units Subcutaneous TID WC  . insulin glargine  20 Units Subcutaneous Daily  . ipratropium-albuterol  3 mL Nebulization Q4H  . methylPREDNISolone (SOLU-MEDROL) injection  80 mg Intravenous Q12H  . pantoprazole  40 mg Oral Daily  . warfarin  7.5 mg Oral ONCE-1800  . Warfarin - Pharmacist Dosing Inpatient   Does not apply q1800    Continuous Infusions: . sodium chloride 20 mL/hr at 05/14/16 0300    PRN Meds: albuterol, bisacodyl, LORazepam, morphine injection, sennosides  Physical Exam        Thin frail elderly man, lying in bed with increased work of breathing lying at rest.  Family at bedside. CV tachycardic Resp:  Coarse sounds with moderately increased WOB lying still. Abdomen:  Soft, nt Ext.  Able to move all four.   Vital Signs: BP 140/85 mmHg  Pulse 117  Temp(Src) 98.7 F (37.1 C) (Oral)  Resp 28  Ht 5' 6"  (1.676 m)  Wt 68.3 kg (150 lb 9.2 oz)  BMI 24.31 kg/m2  SpO2 88% SpO2: SpO2: (!) 88 % O2 Device: O2 Device: Nasal Cannula O2 Flow Rate: O2 Flow Rate (L/min): 6 L/min  Intake/output summary:  Intake/Output Summary (Last 24 hours) at 05/17/16 1112 Last data filed at 05/17/16 0558  Gross per 24 hour  Intake    560 ml  Output   1375 ml  Net   -815 ml   LBM: Last BM Date: 05/14/16 Baseline Weight: Weight: 72 kg (158 lb 11.7 oz) Most recent weight: Weight: 68.3 kg (150 lb 9.2 oz)       Palliative Assessment/Data:    Flowsheet Rows        Most Recent Value   Intake Tab    Referral Department  Hospitalist    Unit at Time of Referral  Med/Surg Unit   Palliative Care Primary Diagnosis  Cancer   Date Notified  05/16/16   Palliative Care Type  New Palliative care   Reason for referral  Clarify Goals of Care, Counsel Regarding Hospice, End of [place]   Date of Admission  05/10/16   Date first seen by Palliative Care  05/17/16   # of days Palliative referral response time  1 Day(s)   # of days IP prior to Palliative referral  6   Clinical Assessment    Palliative Performance Scale Score  30%   Dyspnea Max Last 24 Hours  1   Psychosocial & Spiritual Assessment    Palliative Care Outcomes    Patient/Family meeting held?  Yes   Who was at the meeting?  daughter and patient [daughter, Dtr in [place], Son in [place] regarding hospice, Clarified goals of care      Patient Active Problem List   Diagnosis Date Noted  . Delirium   . Pneumonia   . Acute respiratory failure ([place]) 05/11/2016  . Acidemia   . Acute on chronic respiratory failure with hypoxia and hypercapnia ([facility])   . PAF (paroxysmal atrial fibrillation) ([place])   . Nocturnal hypoxemia 01/14/2014  . CAP (community acquired pneumonia) 12/05/2013  . HCAP (healthcare-associated pneumonia) 12/05/2013  . Sepsis(995.91) 12/05/2013  . Healthcare associated bacterial pneumonia 12/05/2013  . Non-ischemic cardiomyopathy - mild-moderate (EF ~45%), Negative Myoview. 11/17/2013  . Long term current use of anticoagulant therapy 11/15/2013  . COPD exacerbation ([place]) 11/07/2013  . Lung cancer ([place]) 11/06/2013  . Essential hypertension 10/31/2013    Class: Chronic  . Dyspnea on exertion 10/31/2013  Class: Chronic  . Preoperative cardiovascular examination - for bronchoscopy 10/31/2013    Class: Acute  . Cough 08/16/2013  . Adenocarcinoma, lung ([place]) 07/06/2011  . DM 07/16/2010  . Hyperlipidemia with target LDL less than 100 07/16/2010  . COPD GOLD III 07/16/2010    Palliative Care Assessment & Plan     Assessment: Pleasant elderly male with end stage COPD as well as advanced aortic stenosis, CHF.  He has a history of NSCLC and wants no further treatment.  He is severely dyspneic at rest.    Recommendations/Plan:  Will order morphine q 1 hour PRN.  Monitor amount of morphine needed over the next 6 hours in order to determine a drip rate.  MOM for constipation x 1  Hospice Evaluation.  I believe he will need residential hospice.  Goals of Care and Additional Recommendations:  Limitations on Scope of Treatment: Full Comfort Care  Code Status:  DNR  Prognosis:   Estimated 2-3 weeks.  Patient is bed bound with labile pulse and respiration rate.  Unable to breath comfortably even at complete rest.  No further treatment options available.    Discharge Planning:  Hospice facility  Care plan was discussed with [facility] MD, patient and family at bedside.  Thank you for allowing the Palliative Medicine Team to assist in the care of this patient.   Time In: 9:00 Time Out: 10:10 Total Time 70 min Prolonged Time Billed no      Greater than 50%  of this time was spent counseling and coordinating care related to the above assessment and plan.  Melton Alar, PA-C  Please contact Palliative Medicine Team phone at [phone] for questions and concerns.

## 2016-05-17 NOTE — Plan of Care (Signed)
Problem: Phase I Progression Outcomes Goal: Tolerating diet Outcome: Completed/Met Date Met:  05/17/16 Tolerating diet as noted per previous shifts documentation.

## 2016-05-19 ENCOUNTER — Encounter: Payer: Self-pay | Admitting: *Deleted

## 2016-05-19 NOTE — Progress Notes (Signed)
Mr. Brent Wade  Was discharged condition on 05-17-16.   The patient and daughter Brent Wade are opting for residential Hospice.   The scheduling department is aware of the discharge and will check with the family about a follow up visit per your request.

## 2016-06-05 ENCOUNTER — Other Ambulatory Visit: Payer: Self-pay | Admitting: Cardiology

## 2016-06-07 NOTE — Telephone Encounter (Signed)
Rx request sent to pharmacy.  

## 2016-06-12 DEATH — deceased

## 2016-07-09 ENCOUNTER — Ambulatory Visit: Payer: Self-pay | Admitting: Pharmacist Clinician (PhC)/ Clinical Pharmacy Specialist

## 2016-07-09 DIAGNOSIS — Z7901 Long term (current) use of anticoagulants: Secondary | ICD-10-CM

## 2017-12-04 IMAGING — DX DG CHEST 2V
2 series · 2 of 2 positions shown · non-contrast
Comparison: 03/29/2016.

CLINICAL DATA: Shortness of breath. Status post right upper lobe
radiation for lung cancer. Ex-smoker.

EXAM:
CHEST  2 VIEW

[chest pa]
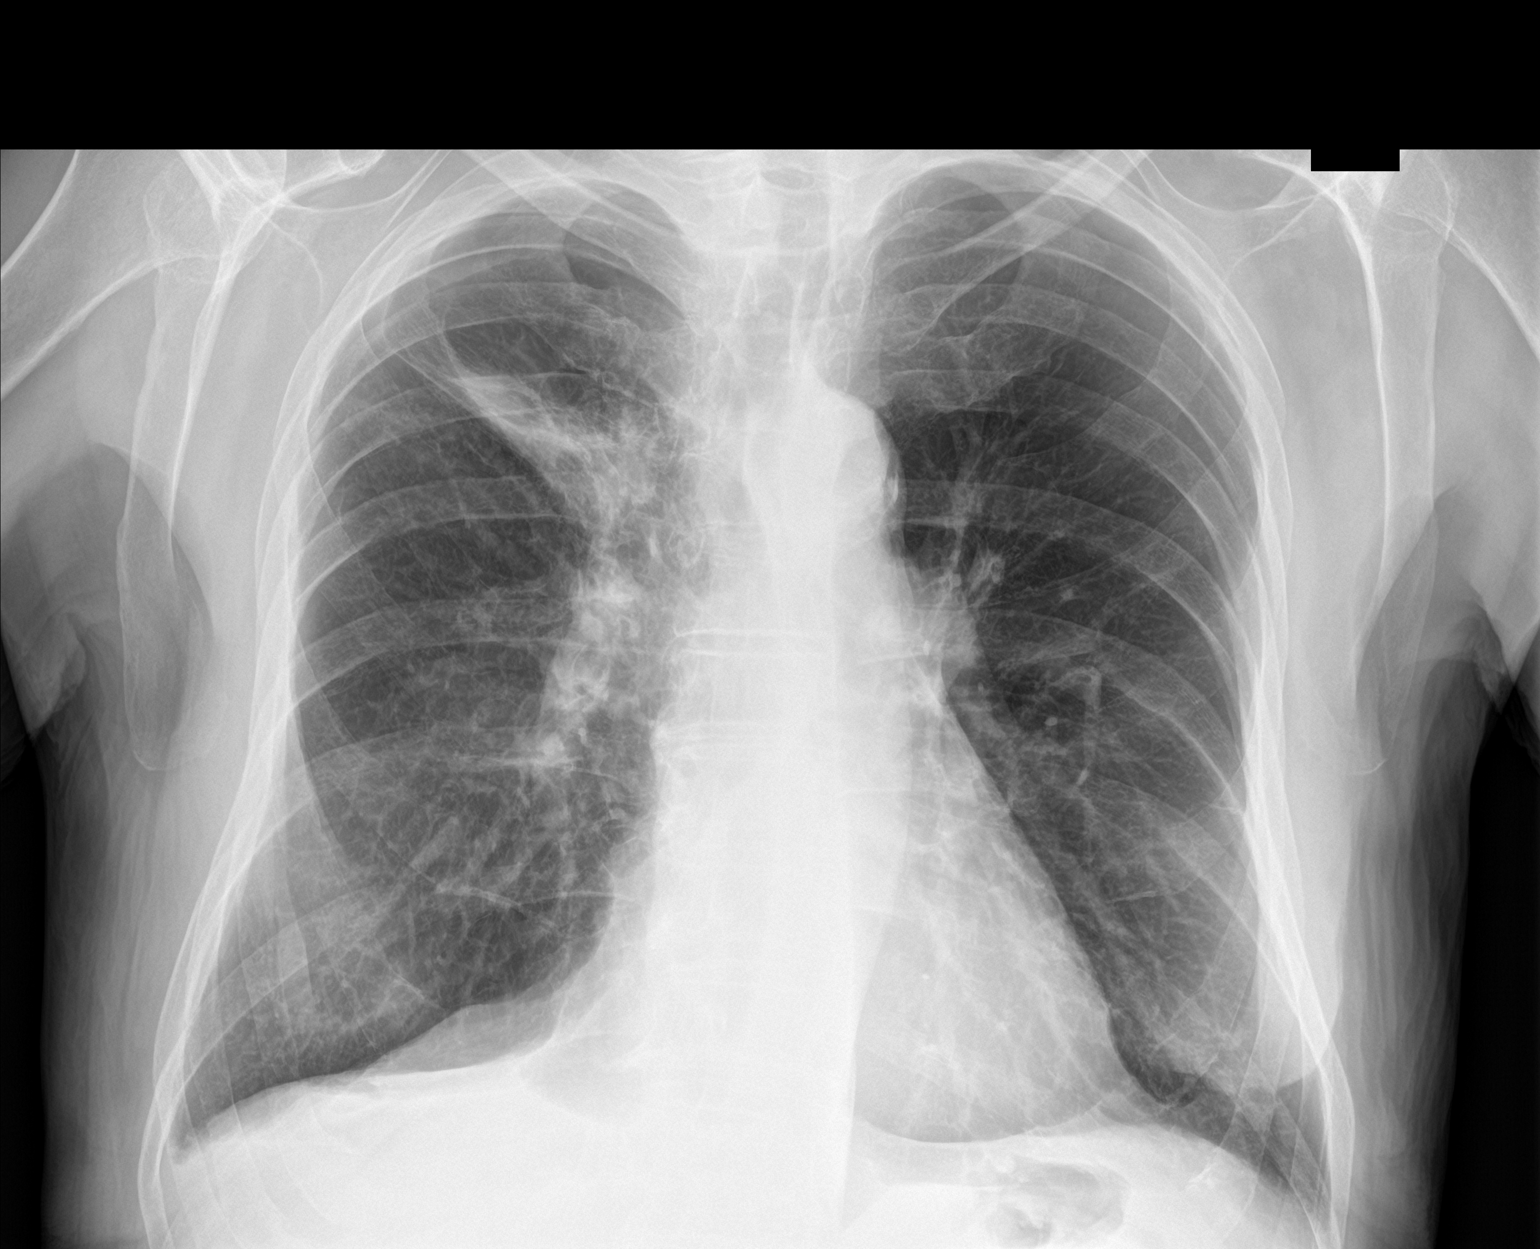

[chest lat]
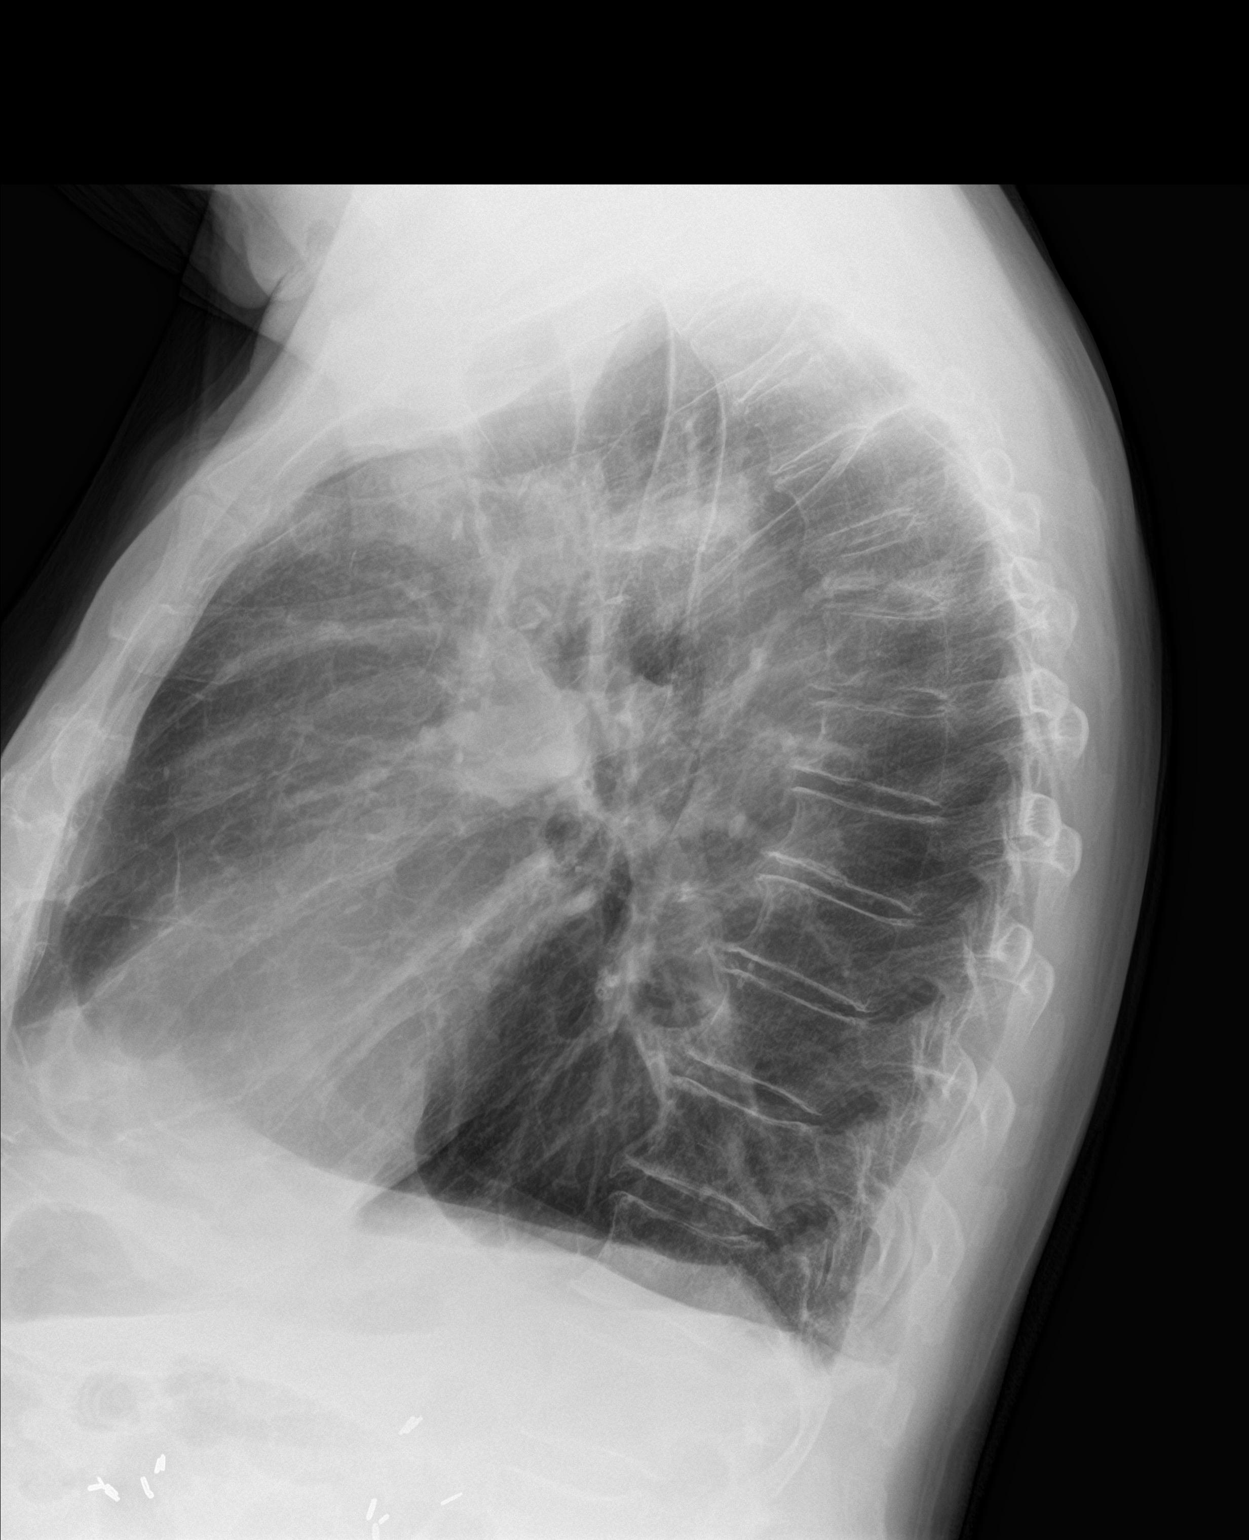

[2 of 2 positions shown; findings below may reference images not displayed]

FINDINGS: The heart remains normal in size. The lungs remain hyperexpanded.
Stable scarring in the right upper lobe. Thoracic spine degenerative
changes and central peribronchial thickening. Upper abdominal
surgical clips. Stable upper thoracic vertebral compression
deformity with sclerosis and no visible retropulsion.
IMPRESSION: Stable changes of COPD and chronic bronchitis and right upper lobe
scarring. No acute abnormality.
